# Patient Record
Sex: Female | Born: 1995 | Race: White | Hispanic: No | Marital: Single | Smoking: Never smoker
Health system: Southern US, Community
[De-identification: ages and names within clinical notes are randomized; demographics above are authoritative.]

## PROBLEM LIST (undated history)

## (undated) DIAGNOSIS — K589 Irritable bowel syndrome without diarrhea: Secondary | ICD-10-CM

## (undated) DIAGNOSIS — I82409 Acute embolism and thrombosis of unspecified deep veins of unspecified lower extremity: Secondary | ICD-10-CM

## (undated) DIAGNOSIS — F445 Conversion disorder with seizures or convulsions: Secondary | ICD-10-CM

## (undated) DIAGNOSIS — J45909 Unspecified asthma, uncomplicated: Secondary | ICD-10-CM

## (undated) DIAGNOSIS — D6861 Antiphospholipid syndrome: Secondary | ICD-10-CM

## (undated) DIAGNOSIS — F32A Depression, unspecified: Secondary | ICD-10-CM

## (undated) DIAGNOSIS — K824 Cholesterolosis of gallbladder: Secondary | ICD-10-CM

## (undated) DIAGNOSIS — F39 Unspecified mood [affective] disorder: Secondary | ICD-10-CM

## (undated) DIAGNOSIS — H548 Legal blindness, as defined in USA: Secondary | ICD-10-CM

## (undated) DIAGNOSIS — F419 Anxiety disorder, unspecified: Secondary | ICD-10-CM

## (undated) DIAGNOSIS — N390 Urinary tract infection, site not specified: Secondary | ICD-10-CM

## (undated) DIAGNOSIS — F329 Major depressive disorder, single episode, unspecified: Secondary | ICD-10-CM

## (undated) DIAGNOSIS — F3181 Bipolar II disorder: Secondary | ICD-10-CM

## (undated) DIAGNOSIS — G43909 Migraine, unspecified, not intractable, without status migrainosus: Secondary | ICD-10-CM

## (undated) DIAGNOSIS — F449 Dissociative and conversion disorder, unspecified: Secondary | ICD-10-CM

## (undated) DIAGNOSIS — M797 Fibromyalgia: Secondary | ICD-10-CM

## (undated) DIAGNOSIS — A048 Other specified bacterial intestinal infections: Secondary | ICD-10-CM

## (undated) HISTORY — DX: Other specified bacterial intestinal infections: A04.8

## (undated) HISTORY — DX: Unspecified mood (affective) disorder: F39

## (undated) HISTORY — DX: Cholesterolosis of gallbladder: K82.4

## (undated) HISTORY — DX: Unspecified asthma, uncomplicated: J45.909

## (undated) HISTORY — DX: Anxiety disorder, unspecified: F41.9

## (undated) HISTORY — DX: Depression, unspecified: F32.A

## (undated) HISTORY — DX: Bipolar II disorder: F31.81

## (undated) HISTORY — DX: Dissociative and conversion disorder, unspecified: F44.9

## (undated) HISTORY — DX: Fibromyalgia: M79.7

## (undated) HISTORY — DX: Conversion disorder with seizures or convulsions: F44.5

## (undated) HISTORY — DX: Migraine, unspecified, not intractable, without status migrainosus: G43.909

## (undated) HISTORY — DX: Irritable bowel syndrome, unspecified: K58.9

## (undated) HISTORY — DX: Urinary tract infection, site not specified: N39.0

## (undated) HISTORY — DX: Major depressive disorder, single episode, unspecified: F32.9

## (undated) HISTORY — PX: COLONOSCOPY WITH ESOPHAGOGASTRODUODENOSCOPY (EGD): SHX5779

## (undated) HISTORY — DX: Legal blindness, as defined in USA: H54.8

## (undated) HISTORY — DX: Acute embolism and thrombosis of unspecified deep veins of unspecified lower extremity: I82.409

---
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
## 2016-02-06 DIAGNOSIS — R519 Headache, unspecified: Secondary | ICD-10-CM | POA: Insufficient documentation

## 2016-06-02 DIAGNOSIS — G5622 Lesion of ulnar nerve, left upper limb: Secondary | ICD-10-CM | POA: Insufficient documentation

## 2016-08-05 DIAGNOSIS — I82409 Acute embolism and thrombosis of unspecified deep veins of unspecified lower extremity: Secondary | ICD-10-CM | POA: Insufficient documentation

## 2016-11-30 DIAGNOSIS — H938X1 Other specified disorders of right ear: Secondary | ICD-10-CM | POA: Insufficient documentation

## 2016-11-30 DIAGNOSIS — R2689 Other abnormalities of gait and mobility: Secondary | ICD-10-CM | POA: Insufficient documentation

## 2017-02-03 DIAGNOSIS — E559 Vitamin D deficiency, unspecified: Secondary | ICD-10-CM | POA: Insufficient documentation

## 2017-04-04 DIAGNOSIS — F445 Conversion disorder with seizures or convulsions: Secondary | ICD-10-CM | POA: Insufficient documentation

## 2017-04-04 DIAGNOSIS — M5481 Occipital neuralgia: Secondary | ICD-10-CM | POA: Insufficient documentation

## 2017-05-26 DIAGNOSIS — R197 Diarrhea, unspecified: Secondary | ICD-10-CM | POA: Insufficient documentation

## 2018-01-05 LAB — PROTIME-INR: INR: 1.8 — AB (ref 0.9–1.1)

## 2018-01-31 LAB — PROTIME-INR: INR: 1.6 — AB (ref 0.9–1.1)

## 2018-02-10 LAB — PROTIME-INR: INR: 1.9 — AB (ref 0.9–1.1)

## 2018-02-21 LAB — PROTIME-INR: INR: 2.8 — AB (ref 0.9–1.1)

## 2018-03-03 LAB — PROTIME-INR: INR: 2.1 — AB (ref 0.9–1.1)

## 2018-03-16 ENCOUNTER — Encounter: Payer: Self-pay | Admitting: Adult Health

## 2018-04-05 LAB — PROTIME-INR: INR: 2.2 — AB (ref 0.9–1.1)

## 2018-04-12 ENCOUNTER — Encounter: Payer: Self-pay | Admitting: Family Medicine

## 2018-04-12 DIAGNOSIS — I951 Orthostatic hypotension: Secondary | ICD-10-CM

## 2018-04-12 DIAGNOSIS — F449 Dissociative and conversion disorder, unspecified: Secondary | ICD-10-CM | POA: Insufficient documentation

## 2018-04-12 DIAGNOSIS — R000 Tachycardia, unspecified: Secondary | ICD-10-CM

## 2018-04-12 DIAGNOSIS — M545 Low back pain, unspecified: Secondary | ICD-10-CM | POA: Insufficient documentation

## 2018-04-12 DIAGNOSIS — M7918 Myalgia, other site: Secondary | ICD-10-CM | POA: Insufficient documentation

## 2018-04-12 DIAGNOSIS — G90A Postural orthostatic tachycardia syndrome (POTS): Secondary | ICD-10-CM | POA: Insufficient documentation

## 2018-04-12 DIAGNOSIS — Z86718 Personal history of other venous thrombosis and embolism: Secondary | ICD-10-CM | POA: Insufficient documentation

## 2018-04-12 DIAGNOSIS — H548 Legal blindness, as defined in USA: Secondary | ICD-10-CM | POA: Insufficient documentation

## 2018-04-12 DIAGNOSIS — M4180 Other forms of scoliosis, site unspecified: Secondary | ICD-10-CM | POA: Insufficient documentation

## 2018-04-12 DIAGNOSIS — G8929 Other chronic pain: Secondary | ICD-10-CM | POA: Insufficient documentation

## 2018-04-12 DIAGNOSIS — I498 Other specified cardiac arrhythmias: Secondary | ICD-10-CM | POA: Insufficient documentation

## 2018-04-12 DIAGNOSIS — K589 Irritable bowel syndrome without diarrhea: Secondary | ICD-10-CM | POA: Insufficient documentation

## 2018-04-13 NOTE — Progress Notes (Signed)
Subjective: DS:KAJGOTLXB care, multiple issues HPI: Samantha Dougherty is a 22 y.o. female presenting to clinic today for:  1. History of migraine headaches/ nonepileptic seizures Patient with a several history of migraine headaches.  She was actually evaluated at [facility] in [place] on March 12, 2017 for seizure-like activity.  Which was described as body jerking activity and a degree of unresponsiveness.  Some eye wandering and speech impairment also noted.  She was admitted to the epilepsy monitoring unit and had 2 days of continuous EEG monitoring which did not demonstrate any epileptiform activity despite having had 5 clinical events during stay.  The seizures were determined to be nonepileptic in origin and she was discharged.  During that hospital course she was having severe occipital headaches and received a occipital nerve block.  She was also discharged with a trial of tramadol and instructed to use gabapentin for neuropathic type pain as a prophylactic medication.  2. Lupus anticoagulant disorder/ hx LLE DVT History of left lower extremity DVT.  She notes that she was found to have lupus anticoagulant disorder and was transitioned from Xarelto to Coumadin.  Goal INR 2-3.  She reports having had INR performed 4 days ago and it was 2.2.  Patient reports that she was seeing a hematologist in [place].  I have not yet received records from them with the exception of lab work obtained at the hospital on 08/20/2016.Marland Kitchen  Lab work at the hospital notes that she was markedly elevated for dilute prothrombin time which was 70.1 with reference range of 0 to 55 seconds.  Thrombin time 18.2 with reference range of 0 to 23 seconds.  PTT-LA 53.1 with reference range of 0 to 51.9 seconds.  Dilute Russell viper venom time 78.2 with reference range of 0 to 47.0 seconds.  Interpretation was that results were consistent with lupus anticoagulant.  3. 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  She has had a EGD and colonoscopy in the past.  She was actually found to have H. pylori and treated for this.  On most recent imaging study, there was a 5 mm polyp noted on the gallbladder.  This was a right upper quadrant ultrasound from March 16, 2018.  She is currently treated with Levsin 0.125 mg every 6 hours as needed.  4. Conversion disorder/ Mood disorder Per chart review, history of conversion disorder, bipolar 2 disorder/mood disorder.  She notes that she is treated with Lamictal, Rexulti, Restoril and Xanax.  Non-epileptiform seizure disorder as above.  Mother also reports that she will sometimes have juvenile speech and patient notes that she also sometimes feels like she is detached.  No substance use.  Family history is significant for anxiety and depression.   Past Medical History:  Diagnosis Date  . Bipolar 2 disorder ([place])   . Conversion disorder   . DVT (deep venous thrombosis) (HCC)    LLE  . IBS (irritable bowel syndrome)   . Legal blindness    right eye  . Migraine   . Mood disorder ([place])   . POTS (postural orthostatic tachycardia syndrome)   . Psychogenic nonepileptic seizure    Past Surgical History:  Procedure Laterality Date  . COLONOSCOPY WITH ESOPHAGOGASTRODUODENOSCOPY (EGD)    . HYMENECTOMY     Social History   Socioeconomic History  . Marital status: Single    Spouse name: Not on file  . Number of children: Not on file  .  Years of education: Not on file  . Highest education level: Not on file  Occupational History  . Not on file  Social Needs  . Financial resource strain: Not on file  . Food insecurity:    Worry: Not on file    Inability: Not on file  . Transportation needs:    Medical: Not on file    Non-medical: Not on file  Tobacco Use  . Smoking status: Never Smoker  . Smokeless tobacco: Never Used  Substance and Sexual Activity  . Alcohol use: Never    Frequency: Never  . Drug use: Never    . Sexual activity: Not Currently  Lifestyle  . Physical activity:    Days per week: Not on file    Minutes per session: Not on file  . Stress: Not on file  Relationships  . Social connections:    Talks on phone: Not on file    Gets together: Not on file    Attends religious service: Not on file    Active member of club or organization: Not on file    Attends meetings of clubs or organizations: Not on file    Relationship status: Not on file  . Intimate partner violence:    Fear of current or ex partner: Not on file    Emotionally abused: Not on file    Physically abused: Not on file    Forced sexual activity: Not on file  Other Topics Concern  . Not on file  Social History Narrative   From [place].  Was receiving medical care at [facility].  Resides with mother.   Current Meds  Medication Sig  . ALPRAZolam (XANAX) 0.5 MG tablet Take 0.5 mg by mouth 3 (three) times daily as needed for anxiety.  . Brexpiprazole (REXULTI) 0.5 MG TABS Take 0.5 mg by mouth every evening.  . escitalopram (LEXAPRO) 20 MG tablet Take 20 mg by mouth daily.  . hyoscyamine (LEVSIN) 0.125 MG/5ML ELIX Take 0.125 mg by mouth every 6 (six) hours as needed.  . lamoTRIgine (LAMICTAL) 25 MG tablet Take 50 mg by mouth daily.  . temazepam (RESTORIL) 7.5 MG capsule Take 7.5 mg by mouth at bedtime as needed for sleep.  . traMADol (ULTRAM) 50 MG tablet Take by mouth daily.  Marland Kitchen warfarin (COUMADIN) 5 MG tablet Take 5 mg by mouth daily. Take on tues, thur, sat and sun  . warfarin (COUMADIN) 7.5 MG tablet Take 7.5 mg by mouth daily. Take on mon, wed, fri   Family History  Problem Relation Age of Onset  . Anxiety disorder Mother   . Depression Mother   . Hyperlipidemia Mother   . Thyroid disease Maternal Grandmother   . Thyroid disease Maternal Grandfather    Allergies  Allergen Reactions  . Amitriptyline     Hallucinations  . Sulfa Antibiotics      Health Maintenance: ?Tdap  ROS: Per HPI  Objective: Office  vital signs reviewed. BP 98/68   Pulse 81   Temp (!) 97.5 F (36.4 C) (Oral)   Ht '5\' 3"'$  (1.6 m)   Wt 153 lb (69.4 kg)   BMI 27.10 kg/m   Physical Examination:  General: Awake, alert, well appearing female, No acute distress HEENT: Normal, MMM Cardio: regular rate and rhythm, S1S2 heard, no murmurs appreciated Pulm: clear to auscultation bilaterally, no wheezes, rhonchi or rales; normal work of breathing on room air Extremities: warm, well perfused, No edema, cyanosis or clubbing; +2 pulses bilaterally; has brace on Left  knee MSK: normal gait and normal station Skin: dry; intact Neuro: No resting tremor noted.  No nystagmus noted.  She is oriented.  She follows commands.  Moves all extremities independently.   Psych: Patient is pleasant.  Mood stable.  Affect appropriate.  Eye contact fair.  Does not appear to be responding to internal stimuli. Depression screen PHQ 2/9 04/14/2018  Decreased Interest 1  Down, Depressed, Hopeless 0  PHQ - 2 Score 1   No flowsheet data found.  Assessment/ Plan: 22 y.o. female   1. Bipolar affective disorder, remission status unspecified ([place]) I have yet to receive notes from her primary care doctor.  I did receive quite a bit of hospital results and notes from hospitalizations over the last 2 years at [facility].  These will be scanned into the EMR.  She is on quite a bit of psychiatric medication including to benzodiazepines.  Will obtain metabolic labs to monitor for metabolic complications of these medications.  I have referred her to psychiatry for further evaluation and management.  She has refills on medications currently.  We discussed that if benzodiazepines are determined to be appropriate that these would need to be prescribed by psychiatry.  She voiced good understanding and is looking forward to establishing with a new psychiatrist. - Ambulatory referral to Psychiatry - Lipid Panel - CMP14+EGFR - CBC with Differential - Bayer DCA Hb A1c  Waived  2. Encounter to establish care with new doctor Release of information form completed for psychiatry, PCP, gastroenterology, hematology, rheumatology  3. Conversion disorder As above - Ambulatory referral to Psychiatry  4. Lupus anticoagulant disorder [facility]) We will scan results that were received from [facility] into the EMR.  She is currently on Coumadin and at a therapeutic dose.  She will follow-up in 1 week with Sharyn Lull, our pharmacist, for Coumadin check.  May be a candidate for at home Coumadin monitoring.  Sharyn Lull will discuss this further and check with patient's insurance to see if this may be something she is interested in. - Ambulatory referral to Hematology  5. Irritable bowel syndrome, unspecified type Will place referral to gastroenterology and attempt to obtain records from previous gastroenterologist.  I did receive a CT abdomen pelvis which demonstrated mild fatty liver but no other acute abnormalities.  This was obtained on 08/24/2017.  Her right upper quadrant ultrasound from March 16, 2018 showed a 5 mm polyp within the gallbladder but was otherwise within normal limits. - Ambulatory referral to Gastroenterology  6. Migraine without status migrainosus, not intractable, unspecified migraine type Will place referral to neurology.  We discussed that use of tramadol may not be an appropriate medication for migraine headaches.  However I will defer to the expertise of the neurologists given patient's complex medical history.  Will be happy to fax over recent admission in 2018 to the neurologic service for non-epileptiform seizure like activity, where EEG and brain imaging studies were performed.  We will also place this in the media tab in the EMR. - Ambulatory referral to Neurology  7. Seizure-like activity ([place]) - Ambulatory referral to Neurology  8. Medication monitoring encounter - Lipid Panel - CMP14+EGFR - CBC with Differential - Bayer DCA Hb A1c  Waived  Total time spent with patient 46 minutes.  Greater than 50% of encounter spent in coordination of care/counseling.  Janora Norlander, DO [place] [phone]

## 2018-04-14 ENCOUNTER — Encounter: Payer: Self-pay | Admitting: Family Medicine

## 2018-04-14 ENCOUNTER — Ambulatory Visit: Payer: 59 | Admitting: Family Medicine

## 2018-04-14 VITALS — BP 98/68 | HR 81 | Temp 97.5°F | Ht 63.0 in | Wt 153.0 lb

## 2018-04-14 DIAGNOSIS — D6862 Lupus anticoagulant syndrome: Secondary | ICD-10-CM | POA: Diagnosis not present

## 2018-04-14 DIAGNOSIS — F319 Bipolar disorder, unspecified: Secondary | ICD-10-CM

## 2018-04-14 DIAGNOSIS — Z7689 Persons encountering health services in other specified circumstances: Secondary | ICD-10-CM | POA: Diagnosis not present

## 2018-04-14 DIAGNOSIS — K589 Irritable bowel syndrome without diarrhea: Secondary | ICD-10-CM

## 2018-04-14 DIAGNOSIS — Z5181 Encounter for therapeutic drug level monitoring: Secondary | ICD-10-CM

## 2018-04-14 DIAGNOSIS — R569 Unspecified convulsions: Secondary | ICD-10-CM | POA: Insufficient documentation

## 2018-04-14 DIAGNOSIS — G43909 Migraine, unspecified, not intractable, without status migrainosus: Secondary | ICD-10-CM | POA: Insufficient documentation

## 2018-04-14 DIAGNOSIS — F449 Dissociative and conversion disorder, unspecified: Secondary | ICD-10-CM | POA: Diagnosis not present

## 2018-04-14 LAB — BAYER DCA HB A1C WAIVED: HB A1C: 4.8 % (ref <7.0)

## 2018-04-14 NOTE — Patient Instructions (Addendum)
Please make sure that you arrive at least 15 minutes before your scheduled appointment in the future.  You have complex medical needs and I will be unable to accommodate late arrivals going forward.  I have placed referrals today.  You will hear for an appointment within the next week.  We will work on getting records from each of your specialists.  You had labs performed today.  You will be contacted with the results of the labs once they are available, usually in the next 3 business days for routine lab work.

## 2018-04-15 LAB — CBC WITH DIFFERENTIAL/PLATELET
BASOS ABS: 0 10*3/uL (ref 0.0–0.2)
Basos: 0 %
EOS (ABSOLUTE): 0.1 10*3/uL (ref 0.0–0.4)
Eos: 2 %
HEMOGLOBIN: 14.5 g/dL (ref 11.1–15.9)
Hematocrit: 42.7 % (ref 34.0–46.6)
IMMATURE GRANS (ABS): 0 10*3/uL (ref 0.0–0.1)
IMMATURE GRANULOCYTES: 0 %
LYMPHS: 30 %
Lymphocytes Absolute: 1.8 10*3/uL (ref 0.7–3.1)
MCH: 31.1 pg (ref 26.6–33.0)
MCHC: 34 g/dL (ref 31.5–35.7)
MCV: 92 fL (ref 79–97)
MONOCYTES: 8 %
Monocytes Absolute: 0.5 10*3/uL (ref 0.1–0.9)
NEUTROS PCT: 60 %
Neutrophils Absolute: 3.6 10*3/uL (ref 1.4–7.0)
Platelets: 242 10*3/uL (ref 150–450)
RBC: 4.66 x10E6/uL (ref 3.77–5.28)
RDW: 13.2 % (ref 12.3–15.4)
WBC: 5.9 10*3/uL (ref 3.4–10.8)

## 2018-04-15 LAB — CMP14+EGFR
ALK PHOS: 85 IU/L (ref 39–117)
ALT: 35 IU/L — AB (ref 0–32)
AST: 42 IU/L — ABNORMAL HIGH (ref 0–40)
Albumin/Globulin Ratio: 1.7 (ref 1.2–2.2)
Albumin: 4.6 g/dL (ref 3.5–5.5)
BILIRUBIN TOTAL: 0.4 mg/dL (ref 0.0–1.2)
BUN/Creatinine Ratio: 14 (ref 9–23)
BUN: 8 mg/dL (ref 6–20)
CHLORIDE: 102 mmol/L (ref 96–106)
CO2: 24 mmol/L (ref 20–29)
Calcium: 9.3 mg/dL (ref 8.7–10.2)
Creatinine, Ser: 0.59 mg/dL (ref 0.57–1.00)
GFR calc Af Amer: 153 mL/min/{1.73_m2} (ref >59)
GFR calc non Af Amer: 132 mL/min/{1.73_m2} (ref >59)
GLUCOSE: 81 mg/dL (ref 65–99)
Globulin, Total: 2.7 g/dL (ref 1.5–4.5)
Potassium: 4.2 mmol/L (ref 3.5–5.2)
Sodium: 140 mmol/L (ref 134–144)
Total Protein: 7.3 g/dL (ref 6.0–8.5)

## 2018-04-15 LAB — LIPID PANEL
Chol/HDL Ratio: 3.4 ratio (ref 0.0–4.4)
Cholesterol, Total: 171 mg/dL (ref 100–199)
HDL: 50 mg/dL (ref >39)
LDL CALC: 93 mg/dL (ref 0–99)
Triglycerides: 140 mg/dL (ref 0–149)
VLDL CHOLESTEROL CAL: 28 mg/dL (ref 5–40)

## 2018-04-20 ENCOUNTER — Inpatient Hospital Stay (HOSPITAL_COMMUNITY): Payer: 59

## 2018-04-20 ENCOUNTER — Other Ambulatory Visit: Payer: Self-pay

## 2018-04-20 ENCOUNTER — Encounter (HOSPITAL_COMMUNITY): Payer: Self-pay | Admitting: Internal Medicine

## 2018-04-20 ENCOUNTER — Inpatient Hospital Stay (HOSPITAL_COMMUNITY): Payer: 59 | Attending: Internal Medicine | Admitting: Internal Medicine

## 2018-04-20 ENCOUNTER — Telehealth (HOSPITAL_COMMUNITY): Payer: Self-pay

## 2018-04-20 VITALS — BP 103/62 | HR 82 | Temp 98.6°F | Resp 16 | Wt 151.0 lb

## 2018-04-20 DIAGNOSIS — D6862 Lupus anticoagulant syndrome: Secondary | ICD-10-CM

## 2018-04-20 DIAGNOSIS — R109 Unspecified abdominal pain: Secondary | ICD-10-CM | POA: Diagnosis not present

## 2018-04-20 DIAGNOSIS — F449 Dissociative and conversion disorder, unspecified: Secondary | ICD-10-CM | POA: Insufficient documentation

## 2018-04-20 DIAGNOSIS — R0602 Shortness of breath: Secondary | ICD-10-CM | POA: Diagnosis not present

## 2018-04-20 DIAGNOSIS — G43909 Migraine, unspecified, not intractable, without status migrainosus: Secondary | ICD-10-CM | POA: Diagnosis not present

## 2018-04-20 DIAGNOSIS — Z853 Personal history of malignant neoplasm of breast: Secondary | ICD-10-CM | POA: Diagnosis not present

## 2018-04-20 DIAGNOSIS — D508 Other iron deficiency anemias: Secondary | ICD-10-CM

## 2018-04-20 DIAGNOSIS — R7989 Other specified abnormal findings of blood chemistry: Secondary | ICD-10-CM | POA: Insufficient documentation

## 2018-04-20 DIAGNOSIS — K589 Irritable bowel syndrome without diarrhea: Secondary | ICD-10-CM | POA: Diagnosis not present

## 2018-04-20 DIAGNOSIS — Z803 Family history of malignant neoplasm of breast: Secondary | ICD-10-CM | POA: Insufficient documentation

## 2018-04-20 DIAGNOSIS — R760 Raised antibody titer: Secondary | ICD-10-CM | POA: Diagnosis not present

## 2018-04-20 DIAGNOSIS — Z832 Family history of diseases of the blood and blood-forming organs and certain disorders involving the immune mechanism: Secondary | ICD-10-CM | POA: Insufficient documentation

## 2018-04-20 DIAGNOSIS — Z86718 Personal history of other venous thrombosis and embolism: Secondary | ICD-10-CM | POA: Diagnosis not present

## 2018-04-20 DIAGNOSIS — G40909 Epilepsy, unspecified, not intractable, without status epilepticus: Secondary | ICD-10-CM

## 2018-04-20 LAB — PROTIME-INR
INR: 2.28
Prothrombin Time: 25 seconds — ABNORMAL HIGH (ref 11.4–15.2)

## 2018-04-20 NOTE — Telephone Encounter (Signed)
Tried to call patient multiple time this afternoon to let her know her INR was good and to continue to take her same dose that she is on. Will try to call back tomorrow.

## 2018-04-20 NOTE — Progress Notes (Signed)
Referring physician: [facility].  Diagnosis Lupus anticoagulant disorder (HCC) - Plan: Protime-INR  Irritable bowel syndrome, unspecified type  Other iron deficiency anemia - Plan: Ferritin  Staging Cancer Staging No matching staging information was found for the patient.  Assessment and Plan: 1.  Reported history of lupus anticoagulant with left lower extremity DVT.  This reportedly was diagnosed in 2017 or 18.  Records are unavailable for review.  I discussed with her we will obtain records from Dr. Rosine BeatBellman's office in [place] to determine her prior work-up.  Pending those results she may undergo additional studies.  It would be reasonable that she undergo repeat imaging with ultrasound pending her last evaluation once records have been reviewed.  INR was done today and was 2.28.  She should continue her current Coumadin dose until we have been able to review records.  She will return to clinic in 2 to 3 weeks for follow-up.  Further recommendations will follow once records have been reviewed.  She is also referred to GYN for evaluation and discussion of contraceptive options as she reports she is currently on OCP RX by [place] physician.    2.  Seizure disorder/migraines.  She should follow-up with PCP or neurology for evaluation.  3.  IBS.  Patient reportedly has undergone EGD and colonoscopy in the past.  She should follow-up with PCP and GI as recommended.  Will check ferritin today to evaluate for any evidence of iron deficiency.  4.  Conversion disorder/mood disorder.  She should follow-up with PCP or psychiatry as recommended.  5.  Family history of breast cancer and factor V Leiden deficiency.  Will review records to determine if patient underwent hypercoagulable evaluation.  HPI: 22 year old female who has moved here from [place].  She reportedly was diagnosed with a left lower extremity DVT in 2017 or 2018.  She was followed by Dr. Oretha EllisBelman of  hematology in [place].  No records are available for review but [facility] shows labs done 08/20/2016 showed a PT of 70 with a lupus anticoagulant of 53 and a dilute Russell viper venom time of 78.2.  Interpretation of results was reported as consistent with lupus anticoagulant.  According to the patient testing was not done while she was on anticoagulation.  She was reportedly transitioned from Xarelto to Coumadin.  She is also on progesterone only birth control pills and reports she was followed by OB GYN in [place].  She has been on with factor V Leiden deficiency and that on also was diagnosed with breast cancer at the age of 22.  She had recent blood work done 04/14/2018 that showed a white count of 5.9 hemoglobin 14.5 platelets 242,000 chemistries were within normal limits with a creatinine of 0.59 liver function tests were normal at 42 AST ALT was 35.  Patient is seen today for consultation due to reported history of lupus anticoagulant with a history of a left lower extremity DVT.  Problem List Patient Active Problem List   Diagnosis Date Noted  . Bipolar disorder (HCC) [F31.9] 04/14/2018  . 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 Seizure-like activity (HCC) [R56.9] 04/14/2018  . Migraine without status migrainosus, not intractable [G43.909] 04/14/2018  . Chronic low back pain [M54.5, G89.29] 04/12/2018  . History of DVT of lower extremity [Z86.718] 04/12/2018  . Legal blindness [H54.8] 04/12/2018  . POTS (postural orthostatic tachycardia syndrome) [R00.0, I95.1] 04/12/2018  . Conversion disorder [F44.9] 04/12/2018  . IBS (irritable bowel syndrome) [K58.9] 04/12/2018  .  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 Dextroscoliosis [M41.80] 04/12/2018    Past Medical History Past Medical History:  Diagnosis Date  . Bipolar 2 disorder (HCC)   . Conversion disorder   . DVT (deep venous thrombosis) (HCC)    LLE  . IBS  (irritable bowel syndrome)   . Legal blindness    right eye  . Migraine   . Mood disorder (HCC)   . POTS (postural orthostatic tachycardia syndrome)   . Psychogenic nonepileptic seizure     Past Surgical History Past Surgical History:  Procedure Laterality Date  . COLONOSCOPY WITH ESOPHAGOGASTRODUODENOSCOPY (EGD)    . HYMENECTOMY      Family History Family History  Problem Relation Age of Onset  . Anxiety disorder Mother   . Depression Mother   . Hyperlipidemia Mother   . Thyroid disease Maternal Grandmother   . Deep vein thrombosis Maternal Grandmother   . Thyroid disease Maternal Grandfather   . Anxiety disorder Father   . Depression Father   . Autism Sister   . Other Sister        [place] syndrome  . Factor V Leiden deficiency Maternal Aunt   . Breast cancer Maternal Aunt   . Diabetes Paternal Grandfather   . Deep vein thrombosis Maternal Aunt      Social History  reports that she has never smoked. She has never used smokeless tobacco. She reports that she does not drink alcohol or use drugs.  Medications  Current Outpatient Medications:  .  ALPRAZolam (XANAX) 0.5 MG tablet, Take 0.5 mg by mouth 3 (three) times daily as needed for anxiety., Disp: , Rfl:  .  Brexpiprazole (REXULTI) 0.5 MG TABS, Take 0.5 mg by mouth every evening., Disp: , Rfl:  .  escitalopram (LEXAPRO) 20 MG tablet, Take 20 mg by mouth daily., Disp: , Rfl:  .  hyoscyamine (LEVSIN) 0.125 MG/5ML ELIX, Take 0.125 mg by mouth every 6 (six) hours as needed., Disp: , Rfl:  .  lamoTRIgine (LAMICTAL) 25 MG tablet, Take 50 mg by mouth daily., Disp: , Rfl:  .  temazepam (RESTORIL) 7.5 MG capsule, Take 7.5 mg by mouth at bedtime as needed for sleep., Disp: , Rfl:  .  traMADol (ULTRAM) 50 MG tablet, Take by mouth daily., Disp: , Rfl:  .  warfarin (COUMADIN) 5 MG tablet, Take 5 mg by mouth daily. Take on tues, thur, sat and sun, Disp: , Rfl:  .  warfarin (COUMADIN) 7.5 MG tablet, Take 7.5 mg by mouth daily.  Take on mon, wed, fri, Disp: , Rfl:   Allergies Amitriptyline and Sulfa antibiotics  Review of Systems Review of Systems - Oncology ROS negative   Physical Exam  Vitals Wt Readings from Last 3 Encounters:  04/20/18 151 lb (68.5 kg)  04/14/18 153 lb (69.4 kg)   Temp Readings from Last 3 Encounters:  04/20/18 98.6 F (37 C) (Oral)  04/14/18 (!) 97.5 F (36.4 C) (Oral)   BP Readings from Last 3 Encounters:  04/20/18 103/62  04/14/18 98/68   Pulse Readings from Last 3 Encounters:  04/20/18 82  04/14/18 81   Constitutional: Well-developed, well-nourished, and in no distress.   HENT: Head: Normocephalic and atraumatic.  Mouth/Throat: No oropharyngeal exudate. Mucosa moist. Eyes: Pupils are equal, round, and reactive to light. Conjunctivae are normal. No scleral icterus.  Neck: Normal range of motion. Neck supple. No JVD present.  Cardiovascular: Normal rate, regular rhythm and normal heart sounds.  Exam reveals no gallop and no  friction rub.   No murmur heard. Pulmonary/Chest: Effort normal and breath sounds normal. No respiratory distress. No wheezes.No rales.  Abdominal: Soft. Bowel sounds are normal. No distension. There is no tenderness. There is no guarding.  Musculoskeletal: No edema or tenderness.  Lymphadenopathy: No cervical, axillary or supraclavicular adenopathy.  Neurological: Alert and oriented to person, place, and time. No cranial nerve deficit.  Skin: Skin is warm and dry. No rash noted. No erythema. No pallor.  Psychiatric: Affect and judgment normal.   Labs Appointment on 04/20/2018  Component Date Value Ref Range Status  . Prothrombin Time 04/20/2018 25.0* 11.4 - 15.2 seconds Final  . INR 04/20/2018 2.28   Final   Performed at [facility], [address] [place], [place] [address]     Pathology Orders Placed This Encounter  Procedures  . Protime-INR    Standing Status:   Future    Number of Occurrences:   1    Standing Expiration Date:    04/21/2019  . Ferritin    Standing Status:   Future    Standing Expiration Date:   04/21/2019       Ahmed Prima MD

## 2018-04-21 ENCOUNTER — Encounter: Payer: 59 | Admitting: Pharmacist Clinician (PhC)/ Clinical Pharmacy Specialist

## 2018-04-24 ENCOUNTER — Encounter: Payer: Self-pay | Admitting: *Deleted

## 2018-05-01 ENCOUNTER — Emergency Department (HOSPITAL_COMMUNITY)
Admission: EM | Admit: 2018-05-01 | Discharge: 2018-05-01 | Disposition: A | Discharge status: Home / Self Care | Payer: 59 | Attending: Emergency Medicine | Admitting: Emergency Medicine

## 2018-05-01 ENCOUNTER — Encounter (HOSPITAL_COMMUNITY): Payer: Self-pay | Admitting: Emergency Medicine

## 2018-05-01 ENCOUNTER — Other Ambulatory Visit: Payer: Self-pay

## 2018-05-01 ENCOUNTER — Emergency Department (HOSPITAL_COMMUNITY): Payer: 59

## 2018-05-01 DIAGNOSIS — R2242 Localized swelling, mass and lump, left lower limb: Secondary | ICD-10-CM | POA: Insufficient documentation

## 2018-05-01 DIAGNOSIS — Z7901 Long term (current) use of anticoagulants: Secondary | ICD-10-CM | POA: Insufficient documentation

## 2018-05-01 DIAGNOSIS — Z79899 Other long term (current) drug therapy: Secondary | ICD-10-CM | POA: Diagnosis not present

## 2018-05-01 DIAGNOSIS — M25562 Pain in left knee: Secondary | ICD-10-CM | POA: Diagnosis present

## 2018-05-01 DIAGNOSIS — M79605 Pain in left leg: Secondary | ICD-10-CM

## 2018-05-01 LAB — PROTIME-INR
INR: 2.74
Prothrombin Time: 28.8 seconds — ABNORMAL HIGH (ref 11.4–15.2)

## 2018-05-01 NOTE — Discharge Instructions (Addendum)
Take Tylenol for pain and follow-up with your doctor if any problems °

## 2018-05-01 NOTE — ED Triage Notes (Signed)
Patient complaining of swelling to left leg after a dog scratch 2 days ago. 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

## 2018-05-01 NOTE — ED Notes (Signed)
Returned from xray

## 2018-05-01 NOTE — ED Provider Notes (Signed)
[facility] Provider Note   CSN: [id number] Arrival date & time: 05/01/18  1226     History   Chief Complaint Chief Complaint  Patient presents with  . Leg Swelling    HPI Samantha Dougherty is a 22 y.o. female.  HPI Patient had a dog scratch her lower leg 2 days ago.  Has some swelling and pain.  States the pain is now moved behind her left knee.  States she is worried because she has had previous blood clots and is hypercoagulable.  She is on chronic Coumadin.  No chest pain or trouble breathing.  No fevers or chills. Past Medical History:  Diagnosis Date  . Bipolar 2 disorder (HCC)   . Conversion disorder   . DVT (deep venous thrombosis) (HCC)    LLE  . IBS (irritable bowel syndrome)   . Legal blindness    right eye  . Migraine   . Mood disorder (HCC)   . POTS (postural orthostatic tachycardia syndrome)   . Psychogenic nonepileptic seizure     Patient Active Problem List   Diagnosis Date Noted  . Bipolar disorder (HCC) 04/14/2018  . 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  . Seizure-like activity (HCC) 04/14/2018  . Migraine without status migrainosus, not intractable 04/14/2018  . Chronic low back pain 04/12/2018  . History of DVT of lower extremity 04/12/2018  . 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 POTS (postural orthostatic tachycardia syndrome) 04/12/2018  . Conversion disorder 04/12/2018  . IBS (irritable bowel syndrome) 04/12/2018  . 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 Dextroscoliosis 04/12/2018    Past Surgical History:  Procedure Laterality Date  . COLONOSCOPY WITH ESOPHAGOGASTRODUODENOSCOPY (EGD)    . HYMENECTOMY       OB History   None      Home Medications    Prior to Admission medications   Medication Sig Start Date End Date Taking? Authorizing Provider  ALPRAZolam Prudy Feeler(XANAX) 0.5 MG tablet Take 0.5 mg by mouth 3 (three) times daily as needed for anxiety.   Yes [provider]  Brexpiprazole  (REXULTI) 0.5 MG TABS Take 0.5 mg by mouth every evening.   Yes [provider]  escitalopram (LEXAPRO) 20 MG tablet Take 20 mg by mouth daily.   Yes [provider]  lamoTRIgine (LAMICTAL) 25 MG tablet Take 50 mg by mouth daily.   Yes [provider]  temazepam (RESTORIL) 7.5 MG capsule Take 7.5 mg by mouth at bedtime as needed for sleep.   Yes [provider]  traMADol (ULTRAM) 50 MG tablet Take by mouth daily.   Yes [provider]  warfarin (COUMADIN) 5 MG tablet Take 5 mg by mouth daily. Take on tues, thur, sat and sun   Yes [provider]  hyoscyamine (LEVSIN) 0.125 MG/5ML ELIX Take 0.125 mg by mouth every 6 (six) hours as needed.    [provider]  warfarin (COUMADIN) 7.5 MG tablet Take 7.5 mg by mouth daily. Take on mon, wed, fri    [provider]    Family History Family History  Problem Relation Age of Onset  . Anxiety disorder Mother   . Depression Mother   . Hyperlipidemia Mother   . Thyroid disease Maternal Grandmother   . Deep vein thrombosis Maternal Grandmother   . Thyroid disease Maternal Grandfather   . Anxiety disorder Father   . Depression Father   . Autism Sister   . Other Sister        [place] syndrome  .  Factor V Leiden deficiency Maternal Aunt   . Breast cancer Maternal Aunt   . Diabetes Paternal Grandfather   . Deep vein thrombosis Maternal Aunt     Social History Social History   Tobacco Use  . Smoking status: Never Smoker  . Smokeless tobacco: Never Used  Substance Use Topics  . Alcohol use: Never    Frequency: Never  . Drug use: Never     Allergies   Amitriptyline and Sulfa antibiotics   Review of Systems Review of Systems  Constitutional: Negative for appetite change.  Respiratory: Negative for cough and shortness of breath.   Cardiovascular: Positive for leg swelling.  Gastrointestinal: Negative for abdominal distention.  Genitourinary: Negative for flank pain.    Musculoskeletal: Negative for back pain.       Pain behind left knee.  Neurological: Negative for weakness.  Hematological: Bruises/bleeds easily.  Psychiatric/Behavioral: Negative for confusion.     Physical Exam Updated Vital Signs BP 108/71 (BP Location: Right Arm)   Pulse 75   Temp 97.9 F (36.6 C) (Oral)   Resp 14   Ht 5\' 3"  (1.6 m)   Wt 66.2 kg   LMP 04/24/2018   SpO2 98%   BMI 25.86 kg/m   Physical Exam  Constitutional: She appears well-developed.  HENT:  Head: Atraumatic.  Eyes: EOM are normal.  Neck: Neck supple.     ED Treatments / Results  Labs (all labs ordered are listed, but only abnormal results are displayed) Labs Reviewed  PROTIME-INR - Abnormal; Notable for the following components:      Result Value   Prothrombin Time 28.8 (*)    All other components within normal limits    EKG None  Radiology No results found.  Procedures Procedures (including critical care time)  Medications Ordered in ED Medications - No data to display   Initial Impression / Assessment and Plan / ED Course  I have reviewed the triage vital signs and the nursing notes.  Pertinent labs & imaging results that were available during my care of the patient were reviewed by me and considered in my medical decision making (see chart for details).     Patient with pain behind left knee.  Had dog scratch couple days ago but leg otherwise appears well.  With hypercoagulability will check Doppler.  If negative Doppler likely able to discharge home.  Final Clinical Impressions(s) / ED Diagnoses   Final diagnoses:  Acute pain of left knee    ED Discharge Orders    None       Benjiman CorePickering, Torrian Canion, MD 05/01/18 1511

## 2018-05-04 ENCOUNTER — Telehealth: Payer: Self-pay | Admitting: *Deleted

## 2018-05-04 ENCOUNTER — Ambulatory Visit: Payer: 59 | Admitting: Neurology

## 2018-05-04 NOTE — Telephone Encounter (Signed)
Patient's father called and canceled new patient appt the morning it was scheduled.  He said the patient had an active migraine and did not feel she could travel.  I called back and actually spoke to the patient.  I told her we are able to help her with her active migraine, if she would still like to come in today.  She declined the offer and said she did not feel she could make the 45 minute ride to our office.  She will call back when she is ready to reschedule.

## 2018-05-05 ENCOUNTER — Ambulatory Visit (HOSPITAL_COMMUNITY): Payer: 59 | Admitting: Internal Medicine

## 2018-05-12 ENCOUNTER — Encounter: Payer: Self-pay | Admitting: Nurse Practitioner

## 2018-05-15 ENCOUNTER — Other Ambulatory Visit: Payer: Self-pay

## 2018-05-15 ENCOUNTER — Emergency Department (HOSPITAL_COMMUNITY)
Admission: EM | Admit: 2018-05-15 | Discharge: 2018-05-16 | Disposition: A | Discharge status: Home / Self Care | Payer: 59 | Attending: Emergency Medicine | Admitting: Emergency Medicine

## 2018-05-15 ENCOUNTER — Emergency Department (HOSPITAL_COMMUNITY): Payer: 59

## 2018-05-15 ENCOUNTER — Encounter (HOSPITAL_COMMUNITY): Payer: Self-pay

## 2018-05-15 DIAGNOSIS — R110 Nausea: Secondary | ICD-10-CM | POA: Diagnosis not present

## 2018-05-15 DIAGNOSIS — R1010 Upper abdominal pain, unspecified: Secondary | ICD-10-CM | POA: Diagnosis present

## 2018-05-15 DIAGNOSIS — Z79899 Other long term (current) drug therapy: Secondary | ICD-10-CM | POA: Insufficient documentation

## 2018-05-15 DIAGNOSIS — R1013 Epigastric pain: Secondary | ICD-10-CM

## 2018-05-15 LAB — CBC
HCT: 42.1 % (ref 36.0–46.0)
Hemoglobin: 14.3 g/dL (ref 12.0–15.0)
MCH: 31 pg (ref 26.0–34.0)
MCHC: 34 g/dL (ref 30.0–36.0)
MCV: 91.3 fL (ref 78.0–100.0)
Platelets: 192 10*3/uL (ref 150–400)
RBC: 4.61 MIL/uL (ref 3.87–5.11)
RDW: 12.2 % (ref 11.5–15.5)
WBC: 7.1 10*3/uL (ref 4.0–10.5)

## 2018-05-15 LAB — BASIC METABOLIC PANEL
Anion gap: 8 (ref 5–15)
BUN: 10 mg/dL (ref 6–20)
CO2: 28 mmol/L (ref 22–32)
Calcium: 9.2 mg/dL (ref 8.9–10.3)
Chloride: 103 mmol/L (ref 98–111)
Creatinine, Ser: 0.6 mg/dL (ref 0.44–1.00)
GFR calc Af Amer: >60 mL/min (ref >60)
GFR calc non Af Amer: >60 mL/min (ref >60)
Glucose, Bld: 92 mg/dL (ref 70–99)
Potassium: 3.5 mmol/L (ref 3.5–5.1)
Sodium: 139 mmol/L (ref 135–145)

## 2018-05-15 LAB — PROTIME-INR
INR: 2.03
Prothrombin Time: 22.8 seconds — ABNORMAL HIGH (ref 11.4–15.2)

## 2018-05-15 LAB — HCG, QUANTITATIVE, PREGNANCY: hCG, Beta Chain, Quant, S: <1 m[IU]/mL (ref <5)

## 2018-05-15 LAB — TROPONIN I: Troponin I: <0.03 ng/mL (ref <0.03)

## 2018-05-15 MED ORDER — ONDANSETRON HCL 4 MG PO TABS: 4.0000 mg | ORAL_TABLET | Freq: Four times a day (QID) | ORAL | 0 refills | Status: DC | PRN

## 2018-05-15 MED ORDER — PANTOPRAZOLE SODIUM 20 MG PO TBEC: 20.0000 mg | DELAYED_RELEASE_TABLET | Freq: Two times a day (BID) | ORAL | 0 refills | Status: DC

## 2018-05-15 NOTE — ED Triage Notes (Signed)
Pt has been having central chest pain as well as nausea and weakness. States she has had this pain before, but it hasn't been consistent. Also states after she eats she gets nauseous. Diagnosed with pseudo seizures. Also states cramps in calves. Is on blood thinners for a clotting disorder. Takes Coumadin.

## 2018-05-16 NOTE — ED Notes (Signed)
Pt ambulatory to waiting room. Pt verbalized understanding of discharge instructions.   

## 2018-05-18 ENCOUNTER — Ambulatory Visit (HOSPITAL_COMMUNITY): Payer: 59 | Admitting: Internal Medicine

## 2018-05-18 ENCOUNTER — Encounter (HOSPITAL_COMMUNITY): Payer: Self-pay | Admitting: Internal Medicine

## 2018-05-18 ENCOUNTER — Inpatient Hospital Stay (HOSPITAL_COMMUNITY): Payer: 59

## 2018-05-18 ENCOUNTER — Inpatient Hospital Stay (HOSPITAL_COMMUNITY): Payer: 59 | Admitting: Internal Medicine

## 2018-05-18 VITALS — BP 111/53 | HR 75 | Temp 99.2°F | Resp 14 | Wt 157.4 lb

## 2018-05-18 DIAGNOSIS — R109 Unspecified abdominal pain: Secondary | ICD-10-CM

## 2018-05-18 DIAGNOSIS — Z832 Family history of diseases of the blood and blood-forming organs and certain disorders involving the immune mechanism: Secondary | ICD-10-CM

## 2018-05-18 DIAGNOSIS — R1084 Generalized abdominal pain: Secondary | ICD-10-CM

## 2018-05-18 DIAGNOSIS — G40909 Epilepsy, unspecified, not intractable, without status epilepticus: Secondary | ICD-10-CM

## 2018-05-18 DIAGNOSIS — I82492 Acute embolism and thrombosis of other specified deep vein of left lower extremity: Secondary | ICD-10-CM

## 2018-05-18 DIAGNOSIS — R7989 Other specified abnormal findings of blood chemistry: Secondary | ICD-10-CM

## 2018-05-18 DIAGNOSIS — K589 Irritable bowel syndrome without diarrhea: Secondary | ICD-10-CM

## 2018-05-18 DIAGNOSIS — O2230 Deep phlebothrombosis in pregnancy, unspecified trimester: Secondary | ICD-10-CM

## 2018-05-18 DIAGNOSIS — G43909 Migraine, unspecified, not intractable, without status migrainosus: Secondary | ICD-10-CM

## 2018-05-18 DIAGNOSIS — Z86718 Personal history of other venous thrombosis and embolism: Secondary | ICD-10-CM | POA: Diagnosis not present

## 2018-05-18 DIAGNOSIS — R760 Raised antibody titer: Secondary | ICD-10-CM

## 2018-05-18 DIAGNOSIS — Z803 Family history of malignant neoplasm of breast: Secondary | ICD-10-CM

## 2018-05-18 DIAGNOSIS — R0602 Shortness of breath: Secondary | ICD-10-CM | POA: Diagnosis not present

## 2018-05-18 DIAGNOSIS — R634 Abnormal weight loss: Secondary | ICD-10-CM

## 2018-05-18 LAB — APTT: aPTT: 40 seconds — ABNORMAL HIGH (ref 24–36)

## 2018-05-18 NOTE — Patient Instructions (Signed)
[facility] Discharge Instructions  You saw Dr. Higgs today.   Thank you for choosing [facility] to provide your oncology and hematology care.  To afford each patient quality time with our provider, please arrive at least 15 minutes before your scheduled appointment time.   If you have a lab appointment with the Cancer Center please come in thru the  Main Entrance and check in at the main information desk  You need to re-schedule your appointment should you arrive 10 or more minutes late.  We strive to give you quality time with our providers, and arriving late affects you and other patients whose appointments are after yours.  Also, if you no show three or more times for appointments you may be dismissed from the clinic at the providers discretion.     Again, thank you for choosing [facility].  Our hope is that these requests will decrease the amount of time that you wait before being seen by our physicians.       _____________________________________________________________  Should you have questions after your visit to [facility], please contact our office at [phone] between the hours of 8:00 a.m. and 4:30 p.m.  Voicemails left after 4:00 p.m. will not be returned until the following business day.  For prescription refill requests, have your pharmacy contact our office and allow 72 hours.    Cancer Center Support Programs:   > Cancer Support Group  2nd Tuesday of the month 1pm-2pm, Journey Room    

## 2018-05-18 NOTE — Progress Notes (Signed)
Diagnosis DVT (deep vein thrombosis) in pregnancy ([place]) - Plan: APTT  Shortness of breath - Plan: CT CHEST W CONTRAST  Abnormal weight loss - Plan: CT Abdomen Pelvis W Contrast  Deep vein thrombosis (DVT) of other vein of left lower extremity, unspecified chronicity (HCC) - Plan: Factor 5 leiden, Prothrombin gene mutation, Lupus anticoagulant panel, Beta-2-glycoprotein i abs, IgG/M/A, ANA, IFA (with reflex)  Generalized abdominal pain - Plan: Factor 5 leiden, Prothrombin gene mutation, Lupus anticoagulant panel, Beta-2-glycoprotein i abs, IgG/M/A, ANA, IFA (with reflex)  Staging Cancer Staging No matching staging information was found for the patient.  Assessment and Plan:  1.  Reported history of lupus anticoagulant with left lower extremity DVT.  This reportedly was diagnosed in 2017 or 18.  Records unavailable for review at initial evaluation.  We have obtained some records from Dr. Reece Levy office in [place].  Review of records indicate + lupus anticoagulant would have been affected by Xarelto.  Hypercoagulable labs were indicated that they were ordered, but no results are included in outside records.    Left LE doppler done 05/01/2018 was negative for DVT.  Labs done 05/15/2018 reviewed and showed WBC 7.1 HB 14.3 plts 192,000.  Chemistries WNL with K+ 3.5, Cr 0.6.  INR is 2.  She is requesting refills on Coumadin which she reports she takes 7.5 alternating with 5.  I have discussed with her she can continue coumadin for now.  Monthly INR should be adequate.  She is referred to coumadin clinic for management.   She will RTC for follow-up to go over labs.   2.  Abdominal pain/SOB.  She reports LMP was 04/24/2018.  She is referred to GYN.  She is set up for CT CAP based on history and abdominal symptoms.  She will RTC to go over results.  She is also referred to GYN for evaluation and discussion of contraceptive options as she reports she is currently on OCP RX by [facility].   Pulse is 97% on room air.    3.  Elevated LFTs.  She has concerns regarding minimal elevation in LFTs on labs done 03/2018 with AST 42 and ALT of 35 which is likely not clinically significant.  This is likely a normal varient or due to medications.  She is set up for CT of abdomen and pelvis for further evaluation.    4.  Seizure disorder/migraines.  She should follow-up with PCP or neurology for evaluation.  5.  IBS.  Patient reportedly has undergone EGD and colonoscopy in the past.  She is set up for CT abdomen and pelvis due to abdominal complaints.  She will follow-up to go over results.  Follow-up with PCP and GI as recommended.   6.  Conversion disorder/mood disorder.  Follow-up with PCP or psychiatry as recommended.  7.  Family history of breast cancer and factor V Leiden.  Will check Factor V Leiden today in clinic.    Greater than 30 minutes spent with more than 50% spent in counseling and coordination of care.    Interval History:  22 year old female who has moved here from [place].  She reportedly was diagnosed with a left lower extremity DVT in 2017 or 2018.  She was followed by Dr. Samson Frederic of hematology in [place].  No records are available for review but report from [facility] shows labs done 08/20/2016 showed a PT of 70 with a lupus anticoagulant of 53 and a dilute Russell viper venom time of 78.2.  Interpretation of results  was reported as consistent with lupus anticoagulant.  According to the patient testing was not done while she was on anticoagulation.  She was reportedly transitioned from Xarelto to Coumadin.  She is also on progesterone only birth control pills and reports she was followed by OB GYN in [place].  She has been on with factor V Leiden deficiency and that on also was diagnosed with breast cancer at the age of 38.  She had recent blood work done 04/14/2018 that showed a white count of 5.9 hemoglobin 14.5 platelets 242,000 chemistries were within  normal limits with a creatinine of 0.59 liver function tests were normal at 42 AST ALT was 35.    Current Status:  Pt is seen today for follow-up.  She is here to go over doppler and labs.  She reports concerns about elevated LFTS.  She also reports abdominal pain.    Problem List Patient Active Problem List   Diagnosis Date Noted  . Bipolar disorder ([place]) [F31.9] 04/14/2018  . Lupus anticoagulant disorder ([place]) [D68.62] 04/14/2018  . Seizure-like activity ([place]) [R56.9] 04/14/2018  . Migraine without status migrainosus, not intractable [G43.909] 04/14/2018  . Chronic low back pain [M54.5, G89.29] 04/12/2018  . History of DVT of lower extremity [Z86.718] 04/12/2018  . Legal blindness [H54.8] 04/12/2018  . POTS (postural orthostatic tachycardia syndrome) [R00.0, I95.1] 04/12/2018  . Conversion disorder [F44.9] 04/12/2018  . IBS (irritable bowel syndrome) [K58.9] 04/12/2018  . 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 Dextroscoliosis [M41.80] 04/12/2018    Past Medical History Past Medical History:  Diagnosis Date  . Bipolar 2 disorder ([place])   . Conversion disorder   . DVT (deep venous thrombosis) (HCC)    LLE  . IBS (irritable bowel syndrome)   . Legal blindness    right eye  . Migraine   . Mood disorder ([place])   . POTS (postural orthostatic tachycardia syndrome)   . Psychogenic nonepileptic seizure     Past Surgical History Past Surgical History:  Procedure Laterality Date  . COLONOSCOPY WITH ESOPHAGOGASTRODUODENOSCOPY (EGD)    . HYMENECTOMY      Family History Family History  Problem Relation Age of Onset  . Anxiety disorder Mother   . Depression Mother   . Hyperlipidemia Mother   . Thyroid disease Maternal Grandmother   . Deep vein thrombosis Maternal Grandmother   . Thyroid disease Maternal Grandfather   . Anxiety disorder Father   . Depression Father   . Autism Sister   . Other Sister        [place] syndrome  . Factor V Leiden  deficiency Maternal Aunt   . Breast cancer Maternal Aunt   . Diabetes Paternal Grandfather   . Deep vein thrombosis Maternal Aunt      Social History  reports that she has never smoked. She has never used smokeless tobacco. She reports that she does not drink alcohol or use drugs.  Medications  Current Outpatient Medications:  .  ALPRAZolam (XANAX) 0.5 MG tablet, Take 0.5 mg by mouth 3 (three) times daily as needed for anxiety., Disp: , Rfl:  .  Brexpiprazole (REXULTI) 0.5 MG TABS, Take 0.5 mg by mouth every evening., Disp: , Rfl:  .  escitalopram (LEXAPRO) 20 MG tablet, Take 20 mg by mouth every morning. , Disp: , Rfl:  .  hydroxypropyl methylcellulose / hypromellose (ISOPTO TEARS / GONIOVISC) 2.5 % ophthalmic solution, Place 1 drop into both eyes 3 (three) times daily as needed for dry eyes., Disp: , Rfl:  .  hyoscyamine (LEVSIN, ANASPAZ) 0.125 MG tablet, Take 0.125 mg by mouth every 6 (six) hours as needed for bladder spasms or cramping. , Disp: , Rfl:  .  lamoTRIgine (LAMICTAL) 25 MG tablet, Take 50 mg by mouth every morning. , Disp: , Rfl:  .  ondansetron (ZOFRAN) 4 MG tablet, Take 1 tablet (4 mg total) by mouth every 6 (six) hours as needed for nausea or vomiting., Disp: 12 tablet, Rfl: 0 .  pantoprazole (PROTONIX) 20 MG tablet, Take 1 tablet (20 mg total) by mouth 2 (two) times daily before a meal., Disp: 30 tablet, Rfl: 0 .  temazepam (RESTORIL) 7.5 MG capsule, Take 7.5 mg by mouth at bedtime. , Disp: , Rfl:  .  traMADol (ULTRAM) 50 MG tablet, Take 50 mg by mouth every morning. , Disp: , Rfl:  .  warfarin (COUMADIN) 5 MG tablet, Take 5-7.5 mg by mouth See admin instructions. Take '5MG'$  on tues, thur, sat and sun, then take 7.'5mg'$  on all other days, Disp: , Rfl:   Allergies Amitriptyline and Sulfa antibiotics  Review of Systems Review of Systems - Oncology ROS negative other than abdominal pain, SOB.     Physical Exam  Vitals Wt Readings from Last 3 Encounters:  05/01/18 146  lb (66.2 kg)  04/20/18 151 lb (68.5 kg)  04/14/18 153 lb (69.4 kg)   Temp Readings from Last 3 Encounters:  05/15/18 98 F (36.7 C) (Oral)  05/01/18 97.9 F (36.6 C) (Oral)  04/20/18 98.6 F (37 C) (Oral)   BP Readings from Last 3 Encounters:  05/16/18 117/72  05/01/18 99/60  04/20/18 103/62   Pulse Readings from Last 3 Encounters:  05/16/18 70  05/01/18 64  04/20/18 82   Constitutional: Well-developed, well-nourished, and in no distress.   HENT: Head: Normocephalic and atraumatic.  Mouth/Throat: No oropharyngeal exudate. Mucosa moist. Eyes: Pupils are equal, round, and reactive to light. Conjunctivae are normal. No scleral icterus.  Neck: Normal range of motion. Neck supple. No JVD present.  Cardiovascular: Normal rate, regular rhythm and normal heart sounds.  Exam reveals no gallop and no friction rub.   No murmur heard. Pulmonary/Chest: Effort normal and breath sounds normal. No respiratory distress. No wheezes.No rales.  Abdominal: Soft. Bowel sounds are normal. No distension. Tender in LUQ, no guarding.   Musculoskeletal: No edema or tenderness.  Lymphadenopathy: No cervical, axillaryor supraclavicular adenopathy.  Neurological: Alert and oriented to person, place, and time. No cranial nerve deficit.  Skin: Skin is warm and dry. No rash noted. No erythema. No pallor.  Psychiatric: Affect and judgment normal.   Labs Appointment on 05/18/2018  Component Date Value Ref Range Status  . aPTT 05/18/2018 40* 24 - 36 seconds Final   Comment:        IF BASELINE aPTT IS ELEVATED, SUGGEST PATIENT RISK ASSESSMENT BE USED TO DETERMINE APPROPRIATE ANTICOAGULANT THERAPY. Performed at [facility], [address], [place], [place] [address]   Admission on 05/15/2018, Discharged on 05/16/2018  Component Date Value Ref Range Status  . Sodium 05/15/2018 139  135 - 145 mmol/L Final  . Potassium 05/15/2018 3.5  3.5 - 5.1 mmol/L Final  . Chloride 05/15/2018 103  98 - 111 mmol/L  Final  . CO2 05/15/2018 28  22 - 32 mmol/L Final  . Glucose, Bld 05/15/2018 92  70 - 99 mg/dL Final  . BUN 05/15/2018 10  6 - 20 mg/dL Final  . Creatinine, Ser 05/15/2018 0.60  0.44 - 1.00 mg/dL Final  . Calcium 05/15/2018  9.2  8.9 - 10.3 mg/dL Final  . GFR calc non Af Amer 05/15/2018 >60  >60 mL/min Final  . GFR calc Af Amer 05/15/2018 >60  >60 mL/min Final   Comment: (NOTE) The eGFR has been calculated using the CKD EPI equation. This calculation has not been validated in all clinical situations. eGFR's persistently <60 mL/min signify possible Chronic Kidney Disease.   Georgiann Hahn gap 05/15/2018 8  5 - 15 Final   Performed at [facility], [address] [place], [place] [address]  . WBC 05/15/2018 7.1  4.0 - 10.5 K/uL Final  . RBC 05/15/2018 4.61  3.87 - 5.11 MIL/uL Final  . Hemoglobin 05/15/2018 14.3  12.0 - 15.0 g/dL Final  . HCT 05/15/2018 42.1  36.0 - 46.0 % Final  . MCV 05/15/2018 91.3  78.0 - 100.0 fL Final  . MCH 05/15/2018 31.0  26.0 - 34.0 pg Final  . MCHC 05/15/2018 34.0  30.0 - 36.0 g/dL Final  . RDW 05/15/2018 12.2  11.5 - 15.5 % Final  . Platelets 05/15/2018 192  150 - 400 K/uL Final   Performed at [facility], [address], [place], [place] [address]  . Troponin I 05/15/2018 <0.03  <0.03 ng/mL Final   Performed at [facility], [address], [place], [place] [address]  . Prothrombin Time 05/15/2018 22.8* 11.4 - 15.2 seconds Final  . INR 05/15/2018 2.03   Final   Performed at [facility], [address], [place], [place] [address]  . hCG, Beta Chain, Quant, S 05/15/2018 <1  <5 mIU/mL Final   Comment:          GEST. AGE      CONC.  (mIU/mL)   <=1 WEEK        5 - 50     2 WEEKS       50 - 500     3 WEEKS       100 - 10,000     4 WEEKS     1,000 - 30,000     5 WEEKS     3,500 - 115,000   6-8 WEEKS     12,000 - 270,000    12 WEEKS     15,000 - 220,000        FEMALE AND NON-PREGNANT FEMALE:     LESS THAN 5 mIU/mL Performed at [facility], [address],  [place],  [address]      Pathology Orders Placed This Encounter  Procedures  . CT CHEST W CONTRAST    Standing Status:   Future    Standing Expiration Date:   05/18/2019    Order Specific Question:   If indicated for the ordered procedure, I authorize the administration of contrast media per Radiology protocol    Answer:   Yes    Order Specific Question:   Preferred imaging location?    Answer:   [facility]    Order Specific Question:   Radiology Contrast Protocol - do NOT remove file path    Answer:   \\[email or web address]  . CT Abdomen Pelvis W Contrast    Standing Status:   Future    Standing Expiration Date:   05/18/2019    Order Specific Question:   ** REASON FOR EXAM (FREE TEXT)    Answer:   abdominal pain    Order Specific Question:   If indicated for the ordered procedure, I authorize the administration of contrast media per Radiology protocol    Answer:  Yes    Order Specific Question:   Is patient pregnant?    Answer:   No    Order Specific Question:   Preferred imaging location?    Answer:   [facility]    Order Specific Question:   Is Oral Contrast requested for this exam?    Answer:   Yes, Per Radiology protocol    Order Specific Question:   Radiology Contrast Protocol - do NOT remove file path    Answer:   \\[email or web address]  . Factor 5 leiden    Standing Status:   Future    Number of Occurrences:   1    Standing Expiration Date:   05/19/2019  . Prothrombin gene mutation    Standing Status:   Future    Number of Occurrences:   1    Standing Expiration Date:   05/19/2019  . Lupus anticoagulant panel    Standing Status:   Future    Number of Occurrences:   1    Standing Expiration Date:   05/19/2019  . Beta-2-glycoprotein i abs, IgG/M/A    Standing Status:   Future    Number of Occurrences:   1    Standing Expiration Date:   05/19/2019  . ANA, IFA (with reflex)    Standing Status:   Future     Number of Occurrences:   1    Standing Expiration Date:   05/19/2019  . APTT    Standing Status:   Future    Number of Occurrences:   1    Standing Expiration Date:   05/19/2019       Zoila Shutter MD

## 2018-05-19 ENCOUNTER — Ambulatory Visit: Payer: 59 | Admitting: Family Medicine

## 2018-05-19 LAB — ANTINUCLEAR ANTIBODIES, IFA: ANTINUCLEAR ANTIBODIES, IFA: NEGATIVE

## 2018-05-19 LAB — BETA-2-GLYCOPROTEIN I ABS, IGG/M/A

## 2018-05-20 LAB — LUPUS ANTICOAGULANT PANEL
DRVVT: 51.3 s — ABNORMAL HIGH (ref 0.0–47.0)
PTT LA: 50.8 s (ref 0.0–51.9)

## 2018-05-20 LAB — DRVVT MIX: dRVVT Mix: 35.7 s (ref 0.0–47.0)

## 2018-05-23 ENCOUNTER — Ambulatory Visit (HOSPITAL_COMMUNITY): Payer: 59 | Admitting: Internal Medicine

## 2018-05-23 NOTE — ED Provider Notes (Signed)
[facility] EMERGENCY DEPARTMENT Provider Note   CSN: [id number] Arrival date & time: 05/15/18  1831     History   Chief Complaint No chief complaint on file.   HPI Samantha Dougherty is a 22 y.o. female.  HPI   22 year old female with upper abdominal/lower sternal pain.  She has had this pain multiple times previously but feels like it is been getting worse.  She has not noticed any consistent exacerbating relieving factors.  Sometimes feels nauseated with it but not always.  No vomiting.  No fevers or chills.  She recently relocated to this area from [place].  Reports some prior work-up of the symptoms there.  She remembers being told that she has a polyp on her gallbladder.  Denies significant NSAID usage.  Denies significant alcohol.  No prior abdominal surgeries.  Past Medical History:  Diagnosis Date  . Bipolar 2 disorder (HCC)   . Conversion disorder   . DVT (deep venous thrombosis) (HCC)    LLE  . IBS (irritable bowel syndrome)   . Legal blindness    right eye  . Migraine   . Mood disorder (HCC)   . POTS (postural orthostatic tachycardia syndrome)   . Psychogenic nonepileptic seizure     Patient Active Problem List   Diagnosis Date Noted  . Bipolar disorder (HCC) 04/14/2018  . 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  . Seizure-like activity (HCC) 04/14/2018  . Migraine without status migrainosus, not intractable 04/14/2018  . Chronic low back pain 04/12/2018  . History of DVT of lower extremity 04/12/2018  . 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 POTS (postural orthostatic tachycardia syndrome) 04/12/2018  . Conversion disorder 04/12/2018  . IBS (irritable bowel syndrome) 04/12/2018  . 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 Dextroscoliosis 04/12/2018    Past Surgical History:  Procedure Laterality Date  . COLONOSCOPY WITH ESOPHAGOGASTRODUODENOSCOPY (EGD)    . HYMENECTOMY       OB History   None      Home Medications    Prior to  Admission medications   Medication Sig Start Date End Date Taking? Authorizing Provider  ALPRAZolam Prudy Feeler) 0.5 MG tablet Take 0.5 mg by mouth 3 (three) times daily as needed for anxiety.   Yes [provider]  Brexpiprazole (REXULTI) 0.5 MG TABS Take 0.5 mg by mouth every evening.   Yes [provider]  escitalopram (LEXAPRO) 20 MG tablet Take 20 mg by mouth every morning.    Yes [provider]  hydroxypropyl methylcellulose / hypromellose (ISOPTO TEARS / GONIOVISC) 2.5 % ophthalmic solution Place 1 drop into both eyes 3 (three) times daily as needed for dry eyes.   Yes [provider]  hyoscyamine (LEVSIN, ANASPAZ) 0.125 MG tablet Take 0.125 mg by mouth every 6 (six) hours as needed for bladder spasms or cramping.    Yes [provider]  lamoTRIgine (LAMICTAL) 25 MG tablet Take 50 mg by mouth every morning.    Yes [provider]  temazepam (RESTORIL) 7.5 MG capsule Take 7.5 mg by mouth at bedtime.    Yes [provider]  traMADol (ULTRAM) 50 MG tablet Take 50 mg by mouth every morning.    Yes [provider]  warfarin (COUMADIN) 5 MG tablet Take 5-7.5 mg by mouth See admin instructions. Take 5MG  on tues, thur, sat and sun, then take 7.5mg  on all other days   Yes [provider]  ondansetron (ZOFRAN) 4 MG tablet Take 1 tablet (4 mg total) by mouth every  6 (six) hours as needed for nausea or vomiting. 05/15/18   Raeford Razor, MD  pantoprazole (PROTONIX) 20 MG tablet Take 1 tablet (20 mg total) by mouth 2 (two) times daily before a meal. 05/15/18   Raeford Razor, MD    Family History Family History  Problem Relation Age of Onset  . Anxiety disorder Mother   . Depression Mother   . Hyperlipidemia Mother   . Thyroid disease Maternal Grandmother   . Deep vein thrombosis Maternal Grandmother   . Thyroid disease Maternal Grandfather   . Anxiety disorder Father   . Depression Father   . Autism Sister   . Other  Sister        [place] syndrome  . Factor V Leiden deficiency Maternal Aunt   . Breast cancer Maternal Aunt   . Diabetes Paternal Grandfather   . Deep vein thrombosis Maternal Aunt     Social History Social History   Tobacco Use  . Smoking status: Never Smoker  . Smokeless tobacco: Never Used  Substance Use Topics  . Alcohol use: Never    Frequency: Never  . Drug use: Never     Allergies   Amitriptyline and Sulfa antibiotics   Review of Systems Review of Systems  All systems reviewed and negative, other than as noted in HPI.  Physical Exam Updated Vital Signs BP 117/72 (BP Location: Right Arm)   Pulse 70   Temp 98 F (36.7 C) (Oral)   Resp 15   Ht 5\' 3"  (1.6 m)   LMP 04/24/2018   SpO2 99%   BMI 25.86 kg/m   Physical Exam  Constitutional: She appears well-developed and well-nourished. No distress.  HENT:  Head: Normocephalic and atraumatic.  Eyes: Conjunctivae are normal. Right eye exhibits no discharge. Left eye exhibits no discharge.  Neck: Neck supple.  Cardiovascular: Normal rate, regular rhythm and normal heart sounds. Exam reveals no gallop and no friction rub.  No murmur heard. Pulmonary/Chest: Effort normal and breath sounds normal. No respiratory distress.  Abdominal: Soft. She exhibits no distension. There is no tenderness.  Musculoskeletal: She exhibits no edema or tenderness.  Neurological: She is alert.  Skin: Skin is warm and dry.  Psychiatric: She has a normal mood and affect. Her behavior is normal. Thought content normal.  Nursing note and vitals reviewed.    ED Treatments / Results  Labs (all labs ordered are listed, but only abnormal results are displayed) Labs Reviewed  PROTIME-INR - Abnormal; Notable for the following components:      Result Value   Prothrombin Time 22.8 (*)    All other components within normal limits  BASIC METABOLIC PANEL  CBC  TROPONIN I  HCG, QUANTITATIVE, PREGNANCY    EKG EKG  Interpretation  Date/Time:  Monday May 15 2018 18:51:17 EDT Ventricular Rate:  79 PR Interval:  118 QRS Duration: 78 QT Interval:  388 QTC Calculation: 444 R Axis:   70 Text Interpretation:  Normal sinus rhythm T wave abnormality, consider anterior ischemia Abnormal ECG Artifact No old tracing to compare Confirmed by Drema Pry [phone]) on 05/16/2018 8:29:36 PM   Radiology No results found.  Procedures Procedures (including critical care time)  Medications Ordered in ED Medications - No data to display   Initial Impression / Assessment and Plan / ED Course  I have reviewed the triage vital signs and the nursing notes.  Pertinent labs & imaging results that were available during my care of the patient were reviewed by me and considered  in my medical decision making (see chart for details).     I have reviewed the triage vital signs and the nursing notes. Prior records were reviewed for additional information.    Pertinent labs & imaging results that were available during my care of the patient were reviewed by me and considered in my medical decision making (see chart for details).  22 year old female with intermittent epigastric pain.  Her exam is pretty reassuring.  Symptoms have been ongoing for at least months intermittently with prior evaluation for the same.  I doubt emergent process.  I doubt this gallbladder polyp is actually causing her symptoms.  We will give her a trial of PPI.  I doubt emergent process.  She is in the process of establishing care here locally.  Given the duration of this abdominal pain at this point, I feel that it might be worthwhile to see a gastroenterologist.  Final Clinical Impressions(s) / ED Diagnoses   Final diagnoses:  Epigastric pain    ED Discharge Orders         Ordered    pantoprazole (PROTONIX) 20 MG tablet  2 times daily before meals     05/15/18 2346    ondansetron (ZOFRAN) 4 MG tablet  Every 6 hours PRN     05/15/18 2346            Raeford Razor, MD 05/23/18 [phone]

## 2018-05-24 LAB — FACTOR 5 LEIDEN

## 2018-05-24 LAB — PROTHROMBIN GENE MUTATION

## 2018-05-25 ENCOUNTER — Encounter: Payer: 59 | Admitting: Obstetrics and Gynecology

## 2018-05-29 NOTE — Progress Notes (Signed)
Psychiatric Initial Adult Assessment   Patient Identification: Samantha Dougherty MRN:  [id number] Date of Evaluation:  06/05/2018 Referral Source: Self Chief Complaint:   Chief Complaint    Depression; Psychiatric Evaluation     Visit Diagnosis:    ICD-10-CM   1. MDD (major depressive disorder), recurrent episode, moderate (HCC) F33.1     History of Present Illness:   Samantha Dougherty is a 22 y.o. year old female with a history of bipolar II disorder/mood disorder, conversion disorder by chart, non epileptic seizure, Lupus anticoagulant disorder with history of DVT, who is referred for bipolar disorder.   She is accompanied by her mother, Samantha Dougherty.   She states that she has been struggling with depression and anxiety since age 52.  She moved from [place] to [place] 2 months ago.  She has had a difficult time adjusting as she never moved before.  It is also difficult from changing the life style of [place] city [place]. She does not feel "home" yet. She endorses significant fatigue, depression and tends to stay at home, listening to radio. She talks about challenges in high school due to her physical condition. She states that it was "traumatic" that she lost her weight to 90 lbs when she was "bed ridden," which she attributed to Lyme disease, which was diagnosed at age 59. She lost her friends as they did not want to deal with her illness. This experience makes her having difficulty in trusting other people. She also feels that she missed to have teenage life. She graduated from high school last year and feels proud, and wants it to be broad casted (and smiles). She reports frustration that people do not understand her medical condition while she is young. She has had difficulty in doing exercise more than ten minutes. She will have shortness of breath, muscle ache. She complains of significant abdominal pain, nausea, and loose stool after she eats. She denies any worsening  in these symptoms lately. 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  She cannot even do house chores such as washing dishes as the noise of dishes makes her "ear hurt." She "desperately want to live average life." She reports great relationship with her younger sister, age 56. They may find things to enjoy in the house, referring some financial strain.  Samantha Dougherty has SI around menstrual cycle. She denies any previous attempt. Although she denies any decreased need for sleep, euphoria, or impulsive behavior, she was diagnosed with bipolar disorder. Samantha Dougherty thinks that it may be secondary to her mentioning "mood swing" of feeing "good (euthymic)" to depression. She has been on Lamictal, Rexulti, Xanax. She found Lamictal to be very helpful. She was tapered off temazepam and tramadol as she ran out her medication; she felt better after being off this medication. She denies alcohol use or drug use. See other ROS as below.  Patient mother, Samantha Dougherty presents to the interview . 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 She had some lung issues until 22 year old. Although she was doing fairly well from age [age], she started to be "bed bound" after puberty. She was later found to have Lyme disease. Over time, Samantha Dougherty has had difficulty in "making choices." Samantha Dougherty states that she has struggled about where she belongs. She would often tell that Samantha Dougherty has 22 year old mindset while she is 22 year old. Samantha Dougherty wants [employer]  to be more independent. Samantha Dougherty is currently applying for disability. She is waiting for another year of residence to apply school.    Wt Readings from Last 3 Encounters:  06/05/18 152 lb (68.9 kg)  05/31/18 152 lb (68.9 kg)  05/19/18 157 lb 6.4 oz (71.4 kg)    Per PMP,  On tramadol, temazepam, xanax filled on 04/11/2018   Associated Signs/Symptoms: Depression Symptoms:  depressed mood, anhedonia, hypersomnia, fatigue, (Hypo) Manic Symptoms:   denies decreased need for sleep, euphoria Anxiety Symptoms:  mild anxiety, some panic attacks Psychotic Symptoms:  denies paranoia, VH of faces, animals in 2016 for one year, denies AH  PTSD Symptoms: Had a traumatic exposure:  bullied 5-8th grade Re-experiencing:  None Hypervigilance:  No Hyperarousal:  None Avoidance:  Decreased Interest/Participation   Past Psychiatric History:  Outpatient: Dr. Jettie Pagan since 2015, for conversion disorder, r/o bipolar disorder, therapy for 4 years Psychiatry admission: denies  Previous suicide attempt: denies  Past trials of medication:  Lexapro, elavil (hallucinations),  rexulti, Abilify, Xanax, lamotrigine, temazepam History of violence:   Previous Psychotropic Medications: Yes   Substance Abuse History in the last 12 months:  No.  Consequences of Substance Abuse: NA  Past Medical History:  Past Medical History:  Diagnosis Date  . Anxiety   . Asthma   . Bipolar 2 disorder (HCC)   . Conversion disorder   . Depression   . DVT (deep venous thrombosis) (HCC)    LLE  . Fibromyalgia   . Gallbladder polyp   . H. pylori infection   . IBS (irritable bowel syndrome)   . Legal blindness    right eye  . Migraine   . Mood disorder (HCC)   . POTS (postural orthostatic tachycardia syndrome)   . Psychogenic nonepileptic seizure   . Urinary tract infection    off and on x years    Past Surgical History:  Procedure Laterality Date  . COLONOSCOPY WITH ESOPHAGOGASTRODUODENOSCOPY (EGD)    . HYMENECTOMY  2016    Family Psychiatric History:  As below  Family History:  Family History  Problem Relation Age of Onset  . Anxiety disorder Mother   . Depression Mother   . Hyperlipidemia Mother   . Bipolar disorder Mother   . Thyroid disease Maternal Grandmother   . Deep vein thrombosis Maternal Grandmother   . Thyroid disease Maternal Grandfather   . Anxiety disorder Father   . Depression Father   . Autism Sister   . Other Sister         [place] syndrome  . Factor V Leiden deficiency Maternal Aunt   . Breast cancer Maternal Aunt   . Bipolar disorder Maternal Aunt   . Diabetes Paternal Grandfather   . Deep vein thrombosis Maternal Aunt   . Bipolar disorder Maternal Aunt   . Colon cancer Neg Hx   . Esophageal cancer Neg Hx   . Rectal cancer Neg Hx     Social History:   Social History   Socioeconomic History  . Marital status: Single    Spouse name: Not on file  . Number of children: 0  . Years of education: Not on file  . Highest education level: Not on file  Occupational History  . Occupation: Consulting civil engineer  Social Needs  . Financial resource strain: Not on file  . Food insecurity:    Worry: Not on file    Inability: Not on file  . Transportation needs:    Medical: Not on file  Non-medical: Not on file  Tobacco Use  . Smoking status: Never Smoker  . Smokeless tobacco: Never Used  Substance and Sexual Activity  . Alcohol use: Never    Frequency: Never  . Drug use: Never  . Sexual activity: Not Currently  Lifestyle  . Physical activity:    Days per week: Not on file    Minutes per session: Not on file  . Stress: Not on file  Relationships  . Social connections:    Talks on phone: Not on file    Gets together: Not on file    Attends religious service: Not on file    Active member of club or organization: Not on file    Attends meetings of clubs or organizations: Not on file    Relationship status: Not on file  Other Topics Concern  . Not on file  Social History Narrative   From [place].  Was receiving medical care at [facility].  Resides with mother.    Additional Social History:  She lives with her parents, younger sister. She moved from [place] to [place] two months ago.  Single, no children.  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 She had some lung issues until 22 year old. Although she was doing fairly well from age [age], she started to be "bed bound" after puberty. She was later found to have Lyme disease. She was bullied  at school, which she attribute to her physical condition. She graduated from high school at age 66.    Allergies:   Allergies  Allergen Reactions  . Amitriptyline     Hallucinations  . Sulfa Antibiotics     Metabolic Disorder Labs: No results found for: HGBA1C, MPG No results found for: PROLACTIN Lab Results  Component Value Date   CHOL 171 04/14/2018   TRIG 140 04/14/2018   HDL 50 04/14/2018   CHOLHDL 3.4 04/14/2018   LDLCALC 93 04/14/2018     Current Medications: Current Outpatient Medications  Medication Sig Dispense Refill  . ALPRAZolam (XANAX) 0.5 MG tablet Take 1 tablet (0.5 mg total) by mouth 3 (three) times daily as needed for anxiety. 90 tablet 0  . Brexpiprazole (REXULTI) 0.5 MG TABS Take 1 tablet (0.5 mg total) by mouth every evening. 30 tablet 0  . hydroxypropyl methylcellulose / hypromellose (ISOPTO TEARS / GONIOVISC) 2.5 % ophthalmic solution Place 1 drop into both eyes 3 (three) times daily as needed for dry eyes.    . hyoscyamine (LEVSIN, ANASPAZ) 0.125 MG tablet Take 0.125 mg by mouth every 6 (six) hours as needed for bladder spasms or cramping.     . lamoTRIgine (LAMICTAL) 25 MG tablet Take 2 tablets (50 mg total) by mouth every morning. 60 tablet 0  . ondansetron (ZOFRAN) 4 MG tablet Take 1 tablet (4 mg total) by mouth every 6 (six) hours as needed for nausea or vomiting. 12 tablet 0  . pantoprazole (PROTONIX) 20 MG tablet Take 1 tablet (20 mg total) by mouth 2 (two) times daily before a meal. 30 tablet 0  . warfarin (COUMADIN) 5 MG tablet Take 5-7.5 mg by mouth See admin instructions. Take 5MG  on tues, thur, sat and sun, then take 7.5mg  on all other days    . DULoxetine (CYMBALTA) 30 MG capsule 30 mg daily for one week, then 60 mg daily 60 capsule 0   No current facility-administered medications for this visit.     Neurologic: Headache: No Seizure: No Paresthesias:No  Musculoskeletal: Strength & Muscle Tone: within normal limits Gait &  Station:  normal Patient leans: N/A  Psychiatric Specialty Exam: Review of Systems  Musculoskeletal: Positive for myalgias.  Psychiatric/Behavioral: Positive for depression. Negative for hallucinations, memory loss, substance abuse and suicidal ideas. The patient is nervous/anxious and has insomnia.   All other systems reviewed and are negative.   Blood pressure 122/70, pulse 75, height 5\' 3"  (1.6 m), weight 152 lb (68.9 kg), SpO2 97 %.Body mass index is 26.93 kg/m.  General Appearance: Casual and Well Groomed  Eye Contact:  Good  Speech:  Clear and Coherent  Volume:  Normal  Mood:  Depressed  Affect:  Appropriate, Congruent and down at times  Thought Process:  Coherent  Orientation:  Full (Time, Place, and Person)  Thought Content:  Logical  Suicidal Thoughts:  No  Homicidal Thoughts:  No  Memory:  Immediate;   Good  Judgement:  Good  Insight:  Fair  Psychomotor Activity:  Normal  Concentration:  Concentration: Good and Attention Span: Good  Recall:  Good  Fund of Knowledge:Good  Language: Good  Akathisia:  No  Handed:  Right  AIMS (if indicated):  N/A  Assets:  Communication Skills Desire for Improvement  ADL's:  Intact  Cognition: WNL  Sleep:  poor   Assessment Samantha Dougherty is a 22 y.o. year old female with a history of bipolar II disorder/mood disorder, conversion disorder by chart, non epileptic seizure, Lupus anticoagulant disorder with history of DVT, who is referred for bipolar disorder.   # MDD, moderate, recurrent without psychotic features Patient endorses significant fatigue, neurovegetative symptoms and physical symptoms of shortness of breath, GI symptoms, and heightened sensitivity to noise.  She is demoralized by her current medical condition. She also endorses myalgia, which could be attributable to fibromyalgia, or chronic fatigue syndrome, although we cannot rule out other medical cause. Will switch from Lexapro to duloxetine to see if it helps for her depression  and myalgia.  Discussed potential risk of serotonin syndrome.  Also discussed potential interaction with warfarin.  Will continue lamotrigine at this time for mood dysregulation given patient reports significant benefit from it.  Will continue rexulti as adjunctive treatment for depression.  Will continue Xanax as needed for anxiety.  Noted that although she was diagnosed with bipolar 2 disorder in the past, she denies any significant hypomanic symptoms except some mood swing (from depression to normal).  Will continue to monitor.  She will greatly benefit from IOP, which would also be helpful for her to have structure in the day.  Will make a referral.  She will also benefit from CBT; will make referral for individual therapy.    Plan 1. Decrease lexapro 10 mg daily for one week, then discontinue 2. Start duloxetine 30 mg daily for one week, then 60 mg daily  3. Continue lamotrigine 50 mg daily  4. Continue Rexulti 0.5 mg daily  5. Continue Xanax 0.5 mg three times a day for anxiety 5. Return to clinic in one month for 30 mins 6. Referral to IOP 7. Referral to therapy - obtain record from her prior psychiatrist at the next encounter - TSH wnl per patient  The patient demonstrates the following risk factors for suicide: Chronic risk factors for suicide include: psychiatric disorder of depression and chronic pain. Acute risk factors for suicide include: unemployment and social withdrawal/isolation. Protective factors for this patient include: positive social support, coping skills and hope for the future. Considering these factors, the overall suicide risk at this point appears to be low. Patient is  appropriate for outpatient follow up.  Treatment Plan Summary: Plan as above   Neysa Hotter, MD 9/16/20195:04 PM

## 2018-05-31 ENCOUNTER — Other Ambulatory Visit (INDEPENDENT_AMBULATORY_CARE_PROVIDER_SITE_OTHER): Payer: 59

## 2018-05-31 ENCOUNTER — Encounter (INDEPENDENT_AMBULATORY_CARE_PROVIDER_SITE_OTHER): Payer: Self-pay | Admitting: Orthopedic Surgery

## 2018-05-31 ENCOUNTER — Ambulatory Visit (INDEPENDENT_AMBULATORY_CARE_PROVIDER_SITE_OTHER): Payer: 59 | Admitting: Orthopedic Surgery

## 2018-05-31 ENCOUNTER — Ambulatory Visit (INDEPENDENT_AMBULATORY_CARE_PROVIDER_SITE_OTHER): Payer: Self-pay

## 2018-05-31 ENCOUNTER — Telehealth (INDEPENDENT_AMBULATORY_CARE_PROVIDER_SITE_OTHER): Payer: Self-pay

## 2018-05-31 ENCOUNTER — Encounter: Payer: Self-pay | Admitting: Nurse Practitioner

## 2018-05-31 ENCOUNTER — Ambulatory Visit: Payer: 59 | Admitting: Nurse Practitioner

## 2018-05-31 VITALS — BP 110/68 | HR 76 | Ht 63.0 in | Wt 152.0 lb

## 2018-05-31 DIAGNOSIS — G8929 Other chronic pain: Secondary | ICD-10-CM | POA: Diagnosis not present

## 2018-05-31 DIAGNOSIS — M25562 Pain in left knee: Secondary | ICD-10-CM

## 2018-05-31 DIAGNOSIS — R112 Nausea with vomiting, unspecified: Secondary | ICD-10-CM

## 2018-05-31 DIAGNOSIS — R1084 Generalized abdominal pain: Secondary | ICD-10-CM

## 2018-05-31 DIAGNOSIS — M25362 Other instability, left knee: Secondary | ICD-10-CM

## 2018-05-31 DIAGNOSIS — R140 Abdominal distension (gaseous): Secondary | ICD-10-CM

## 2018-05-31 DIAGNOSIS — M25561 Pain in right knee: Secondary | ICD-10-CM

## 2018-05-31 LAB — HEPATIC FUNCTION PANEL
ALBUMIN: 4.4 g/dL (ref 3.5–5.2)
ALK PHOS: 61 U/L (ref 39–117)
ALT: 14 U/L (ref 0–35)
AST: 18 U/L (ref 0–37)
Bilirubin, Direct: 0.1 mg/dL (ref 0.0–0.3)
TOTAL PROTEIN: 7.5 g/dL (ref 6.0–8.3)
Total Bilirubin: 0.4 mg/dL (ref 0.2–1.2)

## 2018-05-31 LAB — IGA: IgA: 167 mg/dL (ref 68–378)

## 2018-05-31 LAB — SEDIMENTATION RATE: Sed Rate: 34 mm/hr — ABNORMAL HIGH (ref 0–20)

## 2018-05-31 LAB — HIGH SENSITIVITY CRP: CRP, High Sensitivity: 3.89 mg/L (ref 0.000–5.000)

## 2018-05-31 NOTE — Progress Notes (Signed)
Agree with assessment and plan as outlined.  

## 2018-05-31 NOTE — Progress Notes (Signed)
GI Provider:  New patient              Chief Complaint: Abdominal pain, bloating, loss of appetite, chest pain, nausea and vomiting, bowel changes, liver problems,  Referring Provider:  Ronnie Doss, DO     ASSESSMENT AND PLAN;   42. 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 Evaluated in [place] in 2016, diagnosed with IBS. All of her symptoms are exacerbated by stress.  Father asks about Crohn's disease. Doubt IBD, suspect the majority of her symptoms are functional.  Do see that someone has scheduled her for CT scan of the abdomen and pelvis to be done 06/05/2018 -Obtain EGD and colonoscopy reports from [facility] -obtain U/S report (done last week) -CRP, ESR, tTg, IgA - She will need to come back for office visit to review all the results and formulate a plan -Hematologist has scheduled her for a CT scan of chest / abd / pelvis to be done on 06/05/18. This could point [place] in some direction with GI workup depending on results.    2. Mild transamnitis.  Enzymes mildly elevated in late July.  I have no other liver tests to compare.  There was suggestion of fatty liver disease on CT scan in 2018 [facility]). Medication related? -Liver test done in July are the only ones in the EMR.  Will recheck liver tests today to see if still elevated and go from there.  It may be difficult to sort out which, if any of her medications could be responsible for abnormal liver tests since most of her meds can't be stopped abruptly.  Will need to check autoimmune / genetic / viral serologies if liver tests remain elevated.  - will request copy of recent abdominal ultrasound  3. Gallbladder polyp (per patient). Will request report. Probably not surgical issue unless it is large  4. Anxiety / depression / conversion disorder. On several psychiatric medications.    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  She had an upper endoscopy and colonoscopy in [place] approximately 3 years ago, I do not have those results but patient says she was diagnosed with IBS.  Samantha Dougherty is here with her father. They recently relocated here from [place]. She has chronic, constant diffuse abdominal pain worse after eating regardless of what she eats.  She feels bloated all the time, has frequent nausea with occasional vomiting.  Dairy nor wheat make  weight cause any worsening pain.  She has chronic abdominal bloating, frequent nausea with occasional vomiting.  She has taken Levsin for years but does think it helps. She has frequent diarrhea without blood. She averages about 5 loose stools a day, no significant nocturnal stooling. She has had no weight loss in fact her weight is up.   She gives a history of GERD with heartburn.  PCP started PPI a week ago and heartburn is better.  Data:  Labs late August including CBC and pregnancy test were normal.  She did have mild transaminitis on labs late July.  Patient says she had a recent abdominal ultrasound revealing of a gallbladder polyp, I do not have those records available.  December 2018 CT scan of the abdomen and pelvis with  contrast done in [place] for evaluation of abdominal pain was unremarkable except for fatty liver.     Past Medical History:  Diagnosis Date  . Anxiety   . Asthma   . Bipolar 2 disorder ([place])   . Conversion disorder   . Depression   . DVT (deep venous thrombosis) (HCC)    LLE  . Fibromyalgia   . Gallbladder polyp   . IBS (irritable bowel syndrome)   . Legal blindness    right eye  . Migraine   . Mood disorder ([place])   . POTS (postural orthostatic tachycardia  syndrome)   . Psychogenic nonepileptic seizure   . Urinary tract infection    off and on x years     Past Surgical History:  Procedure Laterality Date  . COLONOSCOPY WITH ESOPHAGOGASTRODUODENOSCOPY (EGD)    . HYMENECTOMY  2016   Family History  Problem Relation Age of Onset  . Anxiety disorder Mother   . Depression Mother   . Hyperlipidemia Mother   . Thyroid disease Maternal Grandmother   . Deep vein thrombosis Maternal Grandmother   . Thyroid disease Maternal Grandfather   . Anxiety disorder Father   . Depression Father   . Autism Sister   . Other Sister        [place] syndrome  . Factor V Leiden deficiency Maternal Aunt   . Breast cancer Maternal Aunt   . Diabetes Paternal Grandfather   . Deep vein thrombosis Maternal Aunt   . Colon cancer Neg Hx   . Esophageal cancer Neg Hx   . Rectal cancer Neg Hx    Social History   Tobacco Use  . Smoking status: Never Smoker  . Smokeless tobacco: Never Used  Substance Use Topics  . Alcohol use: Never    Frequency: Never  . Drug use: Never   Current Outpatient Medications  Medication Sig Dispense Refill  . ALPRAZolam (XANAX) 0.5 MG tablet Take 0.5 mg by mouth 3 (three) times daily as needed for anxiety.    . Brexpiprazole (REXULTI) 0.5 MG TABS Take 0.5 mg by mouth every evening.    . escitalopram (LEXAPRO) 20 MG tablet Take 20 mg by mouth every morning.     . hydroxypropyl methylcellulose / hypromellose (ISOPTO TEARS / GONIOVISC) 2.5 % ophthalmic solution Place 1 drop into both eyes 3 (three) times daily as needed for dry eyes.    . hyoscyamine (LEVSIN, ANASPAZ) 0.125 MG tablet Take 0.125 mg by mouth every 6 (six) hours as needed for bladder spasms or cramping.     . lamoTRIgine (LAMICTAL) 25 MG tablet Take 50 mg by mouth every morning.     . ondansetron (ZOFRAN) 4 MG tablet Take 1 tablet (4 mg total) by mouth every 6 (six) hours as needed for nausea or vomiting. 12 tablet 0  . pantoprazole (PROTONIX) 20 MG tablet Take 1  tablet (20 mg total) by mouth 2 (two) times daily before a meal. 30 tablet 0  . temazepam (RESTORIL) 7.5 MG capsule Take 7.5 mg by mouth at bedtime.     . traMADol (ULTRAM) 50 MG tablet Take 50 mg by mouth every morning.     . warfarin (COUMADIN) 5 MG tablet Take 5-7.5 mg by mouth See admin instructions. Take '5MG'$  on tues, thur, sat and sun, then take 7.'5mg'$  on all other days     No current facility-administered medications for this visit.    Allergies  Allergen Reactions  . Amitriptyline     Hallucinations  .  Sulfa Antibiotics      Review of Systems: Positive for anxiety, back pain, depression, fatigue, headaches, itching, menstrual pain, muscle pain and cramps, sleeping problems and skin rash. All other systems reviewed and negative except where noted in HPI.   Serum creatinine: 0.6 mg/dL 05/15/18 1941 Estimated creatinine clearance: 103.6 mL/min   Physical Exam:    Wt Readings from Last 3 Encounters:  05/31/18 152 lb (68.9 kg)  05/19/18 157 lb 6.4 oz (71.4 kg)  05/01/18 146 lb (66.2 kg)    BP 110/68   Pulse 76   Ht '5\' 3"'$  (1.6 m)   Wt 152 lb (68.9 kg)   BMI 26.93 kg/m  Constitutional:  Pleasant female in no acute distress. Psychiatric: Normal mood and affect. Behavior is normal. EENT: Pupils normal.  Conjunctivae are normal. No scleral icterus. Neck supple.  Cardiovascular: Normal rate, regular rhythm. No edema Pulmonary/chest: Effort normal and breath sounds normal. No wheezing, rales or rhonchi. Abdominal: Soft, nondistended, nontender. Bowel sounds active throughout. There are no masses palpable. No hepatomegaly. Neurological: Alert and oriented to person place and time. Skin: Skin is warm and dry. No rashes noted.  Tye Savoy, NP  05/31/2018, 11:23 AM  Cc: Janora Norlander, DO

## 2018-05-31 NOTE — Progress Notes (Signed)
Office Visit Note   Patient: Samantha Dougherty           Date of Birth: 02-Jan-1996           MRN: [id number] Visit Date: 05/31/2018 Requested by: Raliegh Ip, DO [address] [place], [place] [address] PCP: Raliegh Ip, DO  Subjective: Chief Complaint  Patient presents with  . Left Knee - Pain  . Right Knee - Pain    HPI: Patient presents with bilateral knee pain left worse than right.  She just moved here from [place].  She is on Coumadin for blood coagulation problem.  She reports left knee patellar instability along with right knee pain.  She is had an MRI scan done in [place].  This was ordered by Dr. Urbano Heir.  She has had physical therapy.  She has had an MRI scan.  She states that her knee gives out.  Reports peripatellar pain and occasional swelling.  She does have a knee brace which she wears.              ROS: All systems reviewed are negative as they relate to the chief complaint within the history of present illness.  Patient denies  fevers or chills.   Assessment & Plan: Visit Diagnoses:  1. Chronic pain of both knees   2. Patellar instability of left knee     Plan: Impression is left knee patellar instability with some ligamentous laxity present.  No effusion today but she does have apprehension.  Plan is to review that MRI scan and then decide on operative treatment.  This would be a complicated endeavor due to her Coumadin use.  She would be at risk for complication.  I will see her back in a couple of weeks and we can discuss operative treatment.  Follow-Up Instructions: Return in about 3 weeks (around 06/21/2018).   Orders:  Orders Placed This Encounter  Procedures  . XR Knee 1-2 Views Left  . XR Knee 1-2 Views Right   No orders of the defined types were placed in this encounter.     Procedures: No procedures performed   Clinical Data: No additional findings.  Objective: Vital Signs: There were no vitals taken for this  visit.  Physical Exam:   Constitutional: Patient appears well-developed HEENT:  Head: Normocephalic Eyes:EOM are normal Neck: Normal range of motion Cardiovascular: Normal rate Pulmonary/chest: Effort normal Neurologic: Patient is alert Skin: Skin is warm Psychiatric: Patient has normal mood and affect    Ortho Exam: Ortho exam demonstrates full range of motion of both knees with no increased Q angle.  She does have a bit of a J sign with quad contraction on the left-hand side.  Motor or sensory function of both feet intact.  No calf tenderness present.  Pedal pulses palpable.  With the knee flexed about 20 degrees I can subluxate her patella almost over the lateral condyle.  This does create apprehension.  Right knee also has laxity but not quite as much.  Collateral and cruciate ligaments are stable on the left.  No other masses lymph adenopathy or skin changes noted.  Specialty Comments:  No specialty comments available.  Imaging: Xr Knee 1-2 Views Left  Result Date: 05/31/2018 AP lateral left knee reviewed.  No fracture dislocation.  No arthritis.  Alignment normal.  Normal left knee  Xr Knee 1-2 Views Right  Result Date: 05/31/2018 AP lateral right knee reviewed.  No fracture no arthritis.  Joint spaces  maintained.  Normal right knee    PMFS History: Patient Active Problem List   Diagnosis Date Noted  . Bipolar disorder (HCC) 04/14/2018  . 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  . Seizure-like activity (HCC) 04/14/2018  . Migraine without status migrainosus, not intractable 04/14/2018  . Chronic low back pain 04/12/2018  . History of DVT of lower extremity 04/12/2018  . 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 POTS (postural orthostatic tachycardia syndrome) 04/12/2018  . Conversion disorder 04/12/2018  . IBS (irritable bowel syndrome) 04/12/2018  . 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 Dextroscoliosis 04/12/2018   Past Medical History:  Diagnosis  Date  . Anxiety   . Asthma   . Bipolar 2 disorder (HCC)   . Conversion disorder   . Depression   . DVT (deep venous thrombosis) (HCC)    LLE  . Fibromyalgia   . Gallbladder polyp   . H. pylori infection   . IBS (irritable bowel syndrome)   . Legal blindness    right eye  . Migraine   . Mood disorder (HCC)   . POTS (postural orthostatic tachycardia syndrome)   . Psychogenic nonepileptic seizure   . Urinary tract infection    off and on x years    Family History  Problem Relation Age of Onset  . Anxiety disorder Mother   . Depression Mother   . Hyperlipidemia Mother   . Thyroid disease Maternal Grandmother   . Deep vein thrombosis Maternal Grandmother   . Thyroid disease Maternal Grandfather   . Anxiety disorder Father   . Depression Father   . Autism Sister   . Other Sister        [place] syndrome  . Factor V Leiden deficiency Maternal Aunt   . Breast cancer Maternal Aunt   . Diabetes Paternal Grandfather   . Deep vein thrombosis Maternal Aunt   . Colon cancer Neg Hx   . Esophageal cancer Neg Hx   . Rectal cancer Neg Hx     Past Surgical History:  Procedure Laterality Date  . COLONOSCOPY WITH ESOPHAGOGASTRODUODENOSCOPY (EGD)    . HYMENECTOMY  2016   Social History   Occupational History  . Occupation: Consulting civil engineer  Tobacco Use  . Smoking status: Never Smoker  . Smokeless tobacco: Never Used  Substance and Sexual Activity  . Alcohol use: Never    Frequency: Never  . Drug use: Never  . Sexual activity: Not Currently

## 2018-05-31 NOTE — Telephone Encounter (Signed)
Dr August Saucer requesting copy of MRI report of scan that patient previously had done at [facility] in [facility]. I tried calling to get report while patient was here but received greeting that office was closed. Will try to reach out to them tomorrow to see if we can get report. Dr August Saucer will call patient once we have scan.  [phone]

## 2018-05-31 NOTE — Patient Instructions (Signed)
If you are age 21 or older, your body mass index should be between 23-30. Your Body mass index is 26.93 kg/m. If this is out of the aforementioned range listed, please consider follow up with your Primary Care Provider.  If you are age 4 or younger, your body mass index should be between 19-25. Your Body mass index is 26.93 kg/m. If this is out of the aformentioned range listed, please consider follow up with your Primary Care Provider.   Your provider has requested that you go to the basement level for lab work before leaving today. Press "B" on the elevator. The lab is located at the first door on the left as you exit the elevator.  We will contact you regarding a follow up appointment once we receive records.  Thank you for choosing me and [facility].   Willette Cluster, NP

## 2018-06-01 LAB — TISSUE TRANSGLUTAMINASE, IGA: (TTG) AB, IGA: 1 U/mL

## 2018-06-01 NOTE — Telephone Encounter (Signed)
IC office and s/w Alyssa and she stated she would fax report

## 2018-06-02 ENCOUNTER — Ambulatory Visit: Payer: 59 | Admitting: Family Medicine

## 2018-06-02 ENCOUNTER — Encounter: Payer: 59 | Admitting: Obstetrics and Gynecology

## 2018-06-02 NOTE — Telephone Encounter (Signed)
Report received as requested. Laid it on your computer. Please review and advise. Thanks.

## 2018-06-05 ENCOUNTER — Ambulatory Visit (INDEPENDENT_AMBULATORY_CARE_PROVIDER_SITE_OTHER): Payer: 59 | Admitting: Psychiatry

## 2018-06-05 ENCOUNTER — Ambulatory Visit (HOSPITAL_COMMUNITY)
Admission: RE | Admit: 2018-06-05 | Discharge: 2018-06-05 | Disposition: A | Discharge status: Home / Self Care | Payer: 59 | Source: Ambulatory Visit | Attending: Internal Medicine | Admitting: Internal Medicine

## 2018-06-05 ENCOUNTER — Encounter (HOSPITAL_COMMUNITY): Payer: Self-pay | Admitting: Psychiatry

## 2018-06-05 VITALS — BP 122/70 | HR 75 | Ht 63.0 in | Wt 152.0 lb

## 2018-06-05 DIAGNOSIS — F4310 Post-traumatic stress disorder, unspecified: Secondary | ICD-10-CM | POA: Diagnosis not present

## 2018-06-05 DIAGNOSIS — Z810 Family history of intellectual disabilities: Secondary | ICD-10-CM

## 2018-06-05 DIAGNOSIS — Z658 Other specified problems related to psychosocial circumstances: Secondary | ICD-10-CM

## 2018-06-05 DIAGNOSIS — R0602 Shortness of breath: Secondary | ICD-10-CM | POA: Diagnosis present

## 2018-06-05 DIAGNOSIS — Z818 Family history of other mental and behavioral disorders: Secondary | ICD-10-CM

## 2018-06-05 DIAGNOSIS — F331 Major depressive disorder, recurrent, moderate: Secondary | ICD-10-CM | POA: Diagnosis not present

## 2018-06-05 DIAGNOSIS — R634 Abnormal weight loss: Secondary | ICD-10-CM | POA: Diagnosis present

## 2018-06-05 DIAGNOSIS — G4700 Insomnia, unspecified: Secondary | ICD-10-CM

## 2018-06-05 MED ORDER — LAMOTRIGINE 25 MG PO TABS: 50.0000 mg | ORAL_TABLET | Freq: Every morning | ORAL | 0 refills | Status: DC

## 2018-06-05 MED ORDER — DULOXETINE HCL 30 MG PO CPEP: ORAL_CAPSULE | ORAL | 0 refills | Status: DC

## 2018-06-05 MED ORDER — ALPRAZOLAM 0.5 MG PO TABS: 0.5000 mg | ORAL_TABLET | Freq: Three times a day (TID) | ORAL | 0 refills | Status: DC | PRN

## 2018-06-05 MED ORDER — IOPAMIDOL (ISOVUE-300) INJECTION 61%
100.0000 mL | Freq: Once | INTRAVENOUS | Status: AC | PRN
  Administered 2018-06-05: 100 mL via INTRAVENOUS

## 2018-06-05 MED ORDER — BREXPIPRAZOLE 0.5 MG PO TABS: 0.5000 mg | ORAL_TABLET | Freq: Every evening | ORAL | 0 refills | Status: DC

## 2018-06-05 NOTE — Patient Instructions (Signed)
1. Decrease lexapro 10 mg daily for one week, then discontinue 2. Start duloxetine 30 mg daily for one week, then 60 mg daily  3. Continue lamotrigine 50 mg daily  4. Continue Rexulti 0.5 mg daily  5. Continue Xanax 0.5 mg three times a day for anxiety 5. Return to clinic in one month for 30 mins 6. Referral to IOP 7. Referral to therapy

## 2018-06-05 NOTE — Telephone Encounter (Signed)
I reviewed the scan.  Fairly unremarkable.  Her exam is the most remarkable finding.  She has significant patellar instability.  If her knee is giving out all the time that her options are rehab and bracing or surgical stabilization.  That would be a complicated endeavor based on her history of blood clots.  If that the way she wants to go we will set it up.  If he can just call her and find out that would be great thanks

## 2018-06-06 ENCOUNTER — Ambulatory Visit (HOSPITAL_COMMUNITY): Payer: 59 | Admitting: Internal Medicine

## 2018-06-06 ENCOUNTER — Telehealth (HOSPITAL_COMMUNITY): Payer: Self-pay | Admitting: Psychiatry

## 2018-06-06 ENCOUNTER — Telehealth (HOSPITAL_COMMUNITY): Payer: Self-pay | Admitting: Professional

## 2018-06-06 NOTE — Telephone Encounter (Signed)
1 incision on the medial aspect of the knee to take the hamstring tendon.  Second incision right over the medial epicondyle to anchor the tendon third incision over the medial aspect of the patella to past the reconstructed ligament into position.  The tricky thing with her is that she will have to go onto a Lovenox bridge coming off the Coumadin and she will be at risk for DVT.  She will be at risk for hematoma and knee stiffness and premature development of arthritis.  Beyond that if she really wants to get a visual picture of what going on she will need to come in for an appointment.

## 2018-06-06 NOTE — Telephone Encounter (Signed)
appt scheduled

## 2018-06-06 NOTE — Telephone Encounter (Signed)
IC they would like to proceed with surgery but wanted to know how involved surgery would be. What would be done etc?  I tried to schedule appt to discuss questions/concerns but they were wanting to know details before they schedule appt.

## 2018-06-07 ENCOUNTER — Telehealth (HOSPITAL_COMMUNITY): Payer: Self-pay | Admitting: Psychiatry

## 2018-06-07 NOTE — Telephone Encounter (Signed)
D:  Dr. Vanetta ShawlHisada referred pt to [facility]-IOP.  A:  Called to orient pt; pt states she didn't want to be mixed in with pts who are suicidal or with self injurious behaviors.  Informed pt that couldn't be guaranteed.  Discussed individual counseling optiion with her.  Pt states she would discuss with her mother.  Informed Dr. Vanetta ShawlHisada.

## 2018-06-09 ENCOUNTER — Telehealth (HOSPITAL_COMMUNITY): Payer: Self-pay

## 2018-06-09 NOTE — Telephone Encounter (Signed)
Patient called wanting to know CT results. Called patient back and had to leave a message that our physicians don't usually give results over the phone. They like to talk to the patient and give results face to face. Reminded patient she has follow up on 9/24 and MD will go over everything then Call cancer center if any further questions.

## 2018-06-13 ENCOUNTER — Other Ambulatory Visit: Payer: Self-pay

## 2018-06-13 ENCOUNTER — Encounter (HOSPITAL_COMMUNITY): Payer: Self-pay | Admitting: Internal Medicine

## 2018-06-13 ENCOUNTER — Inpatient Hospital Stay (HOSPITAL_COMMUNITY): Payer: 59

## 2018-06-13 ENCOUNTER — Telehealth: Payer: Self-pay | Admitting: Nurse Practitioner

## 2018-06-13 ENCOUNTER — Inpatient Hospital Stay (HOSPITAL_COMMUNITY): Payer: 59 | Attending: Internal Medicine | Admitting: Internal Medicine

## 2018-06-13 VITALS — BP 96/36 | HR 62 | Temp 97.9°F | Resp 16 | Wt 153.3 lb

## 2018-06-13 DIAGNOSIS — Z86718 Personal history of other venous thrombosis and embolism: Secondary | ICD-10-CM | POA: Insufficient documentation

## 2018-06-13 DIAGNOSIS — I82492 Acute embolism and thrombosis of other specified deep vein of left lower extremity: Secondary | ICD-10-CM

## 2018-06-13 LAB — PROTIME-INR
INR: 1.88
Prothrombin Time: 21.4 seconds — ABNORMAL HIGH (ref 11.4–15.2)

## 2018-06-13 NOTE — Telephone Encounter (Signed)
Patient and mother calling nurse back about lab results from 9.11.19.

## 2018-06-13 NOTE — Patient Instructions (Signed)
[facility] at [facility] Discharge Instructions  You Dr. Melton AlarHiggs today. Please have labs drawn before you leave today. We will [facility] to [facility]. We will set you up with the Coumadin clinic here.    Thank you for choosing [facility] at [facility] to provide your oncology and hematology care.  To afford each patient quality time with our provider, please arrive at least 15 minutes before your scheduled appointment time.   If you have a lab appointment with the Cancer Center please come in thru the  Main Entrance and check in at the main information desk  You need to re-schedule your appointment should you arrive 10 or more minutes late.  We strive to give you quality time with our providers, and arriving late affects you and other patients whose appointments are after yours.  Also, if you no show three or more times for appointments you may be dismissed from the clinic at the providers discretion.     Again, thank you for choosing [facility].  Our hope is that these requests will decrease the amount of time that you wait before being seen by our physicians.       _____________________________________________________________  Should you have questions after your visit to [facility], please contact our office at [phone] between the hours of 8:00 a.m. and 4:30 p.m.  Voicemails left after 4:00 p.m. will not be returned until the following business day.  For prescription refill requests, have your pharmacy contact our office and allow 72 hours.    Cancer Center Support Programs:   > Cancer Support Group  2nd Tuesday of the month 1pm-2pm, Journey Room

## 2018-06-13 NOTE — Progress Notes (Signed)
Diagnosis Deep vein thrombosis (DVT) of other vein of left lower extremity, unspecified chronicity (HCC) - Plan: Protime-INR, CBC with Differential/Platelet, Comprehensive metabolic panel, Lactate dehydrogenase, Protime-INR  Staging Cancer Staging No matching staging information was found for the patient.  Assessment and Plan:   1.  Reported history of lupus anticoagulant with left lower extremity DVT.  This reportedly was diagnosed in 2017 or 18.  Records unavailable for review at initial evaluation.  We have obtained some records from Dr. Rosine Beat office in [place].  Review of records indicate + lupus anticoagulant would have been affected by Xarelto.  Hypercoagulable labs were indicated that they were ordered, but no results are included in outside records.    Left LE doppler done 05/01/2018 was negative for DVT.    Labs done 05/31/2018 reviewed and showed no evidence of lupus anticoagulant, normal Factor V leiden and Prothrombin gene mutation, normal beta 2 glycoprotein antibodies.    CT CAP done 06/05/2018 reviewed and showed IMPRESSION: No acute findings in the chest, abdomen or pelvis.  Very small density posteriorly in the gallbladder could reflect small stone or polyp. No CT evidence for cholecystitis.    Pt has been on coumadin since 2017 due to reported history of + lupus anticoagulant.  She had history of DVT reportedly in 2017 but has not had recurrent episodes.  I have discussed with them due to negative work-up and no evidence of thrombosis, the question of her continuing anticoagulation remains.  I discussed with them that usually recommendations for ongoing anticoagulation would need to be warranted as she is 34 yoa and potential risks with anticoagulation  for long term ongoing therapy.  Pt will be referred to [facility] for evaluation based on prior  history and to determine if ongoing anticoagulation would be recommended.  INR today 1.88.  Continue current coumadin  dose for now and pt is referred to coumadin clinic for monitoring until [facility] evaluation has been reviewed.    2.  Abdominal pain/SOB.  Pt has CT CAP done 06/05/2018 that was reviewed and showed  IMPRESSION: No acute findings in the chest, abdomen or pelvis.  Very small density posteriorly in the gallbladder could reflect small stone or polyp. No CT evidence for cholecystitis.    She has been referred to GYN and GI for evaluation.    3.  Elevated LFTs.  She had concerns regarding minimal elevation in LFTs on labs done 03/2018 with AST 42 and ALT of 35 which is likely not clinically significant.  CT of abdomen and pelvis for further evaluation was done on 06/05/2018 that showed no liver abnormalities and no evidence of cholecystitis.  Pt has been referred to GI for evaluation.    4.  Seizure disorder/migraines.  She should follow-up with PCP or neurology for evaluation.  5.  IBS.  Patient reportedly has undergone EGD and colonoscopy in the past.  Follow-up with PCP and GI as recommended.   6.  Conversion disorder/mood disorder.  Follow-up with PCP or psychiatry as recommended.  7.  Family history of breast cancer and factor V Leiden.   Factor V Leiden negative on labs done 05/31/2018.    8.  Bruises.  Minor bruise on ear.  ANA negative.  Likely due to coumadin therapy.    Greater than 30 minutes spent with more than 50% spent in counseling and coordination of care.    Interval History:  Historical data obtained from note dated 05/18/2018.  22 year old female who has moved here from [place].  She reportedly was diagnosed with a left lower extremity DVT in 2017 or 2018.  She was followed by Dr. Oretha Ellis of hematology in [place].  No records are available for review but report from [facility] rocking him shows labs done 08/20/2016 showed a PT of 70 with a lupus anticoagulant of 53 and a dilute Russell viper venom time of 78.2.  Interpretation of results was reported as consistent with lupus  anticoagulant.  According to the patient testing was not done while she was on anticoagulation.  She was reportedly transitioned from Xarelto to Coumadin.  She is also on progesterone only birth control pills and reports she was followed by OB GYN in .  She has been on with factor V Leiden deficiency and that on also was diagnosed with breast cancer at the age of 53.  She had recent blood work done 04/14/2018 that showed a white count of 5.9 hemoglobin 14.5 platelets 242,000 chemistries were within normal limits with a creatinine of 0.59 liver function tests were normal at 42 AST ALT was 35.    Current Status:  Pt is seen today for follow-up.  She is here to go over doppler, scans and labs.  She reports abdominal pain. Mother has concerns about bruises on ear and other symptoms.    Problem List Patient Active Problem List   Diagnosis Date Noted  . MDD (major depressive disorder), recurrent episode, moderate (HCC) [F33.1] 06/05/2018  . Bipolar disorder (HCC) [F31.9] 04/14/2018  . 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 Seizure-like activity (HCC) [R56.9] 04/14/2018  . Migraine without status migrainosus, not intractable [G43.909] 04/14/2018  . Chronic low back pain [M54.5, G89.29] 04/12/2018  . History of DVT of lower extremity [Z86.718] 04/12/2018  . Legal blindness [H54.8] 04/12/2018  . POTS (postural orthostatic tachycardia syndrome) [R00.0, I95.1] 04/12/2018  . Conversion disorder [F44.9] 04/12/2018  . IBS (irritable bowel syndrome) [K58.9] 04/12/2018  . 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 Dextroscoliosis [M41.80] 04/12/2018    Past Medical History Past Medical History:  Diagnosis Date  . Anxiety   . Asthma   . Bipolar 2 disorder (HCC)   . Conversion disorder   . Depression   . DVT (deep venous thrombosis) (HCC)    LLE  . Fibromyalgia   . Gallbladder polyp   . H. pylori infection   . IBS (irritable bowel syndrome)   .  Legal blindness    right eye  . Migraine   . Mood disorder (HCC)   . POTS (postural orthostatic tachycardia syndrome)   . Psychogenic nonepileptic seizure   . Urinary tract infection    off and on x years    Past Surgical History Past Surgical History:  Procedure Laterality Date  . COLONOSCOPY WITH ESOPHAGOGASTRODUODENOSCOPY (EGD)    . HYMENECTOMY  2016    Family History Family History  Problem Relation Age of Onset  . Anxiety disorder Mother   . Depression Mother   . Hyperlipidemia Mother   . Bipolar disorder Mother   . Thyroid disease Maternal Grandmother   . Deep vein thrombosis Maternal Grandmother   . Thyroid disease Maternal Grandfather   . Anxiety disorder Father   . Depression Father   . Autism Sister   . Other Sister        [place] syndrome  . Factor V Leiden deficiency Maternal Aunt   . Breast cancer Maternal Aunt   . Bipolar disorder Maternal Aunt   . Diabetes Paternal Grandfather   . Deep vein  thrombosis Maternal Aunt   . Bipolar disorder Maternal Aunt   . Colon cancer Neg Hx   . Esophageal cancer Neg Hx   . Rectal cancer Neg Hx      Social History  reports that she has never smoked. She has never used smokeless tobacco. She reports that she does not drink alcohol or use drugs.  Medications  Current Outpatient Medications:  .  ALPRAZolam (XANAX) 0.5 MG tablet, Take 1 tablet (0.5 mg total) by mouth 3 (three) times daily as needed for anxiety., Disp: 90 tablet, Rfl: 0 .  Brexpiprazole (REXULTI) 0.5 MG TABS, Take 1 tablet (0.5 mg total) by mouth every evening., Disp: 30 tablet, Rfl: 0 .  hydroxypropyl methylcellulose / hypromellose (ISOPTO TEARS / GONIOVISC) 2.5 % ophthalmic solution, Place 1 drop into both eyes 3 (three) times daily as needed for dry eyes., Disp: , Rfl:  .  hyoscyamine (LEVSIN, ANASPAZ) 0.125 MG tablet, Take 0.125 mg by mouth every 6 (six) hours as needed for bladder spasms or cramping. , Disp: , Rfl:  .  lamoTRIgine (LAMICTAL) 25 MG  tablet, Take 2 tablets (50 mg total) by mouth every morning. (Patient taking differently: Take 50 mg by mouth daily. ), Disp: 60 tablet, Rfl: 0 .  ondansetron (ZOFRAN) 4 MG tablet, Take 1 tablet (4 mg total) by mouth every 6 (six) hours as needed for nausea or vomiting., Disp: 12 tablet, Rfl: 0 .  warfarin (COUMADIN) 5 MG tablet, Take 5-7.5 mg by mouth See admin instructions. Take 5MG  on tues, thur, sat and sun, then take 7.5mg  on all other days, Disp: , Rfl:  .  pantoprazole (PROTONIX) 20 MG tablet, Take 1 tablet (20 mg total) by mouth 2 (two) times daily before a meal. (Patient not taking: Reported on 06/13/2018), Disp: 30 tablet, Rfl: 0  Allergies Amitriptyline and Sulfa antibiotics  Review of Systems Review of Systems - Oncology ROS abdominal pain, bruising.     Physical Exam  Vitals Wt Readings from Last 3 Encounters:  06/13/18 153 lb 4.8 oz (69.5 kg)  05/31/18 152 lb (68.9 kg)  05/19/18 157 lb 6.4 oz (71.4 kg)   Temp Readings from Last 3 Encounters:  06/13/18 97.9 F (36.6 C) (Oral)  05/19/18 99.2 F (37.3 C) (Oral)  05/15/18 98 F (36.7 C) (Oral)   BP Readings from Last 3 Encounters:  06/13/18 (!) 96/36  05/31/18 110/68  05/19/18 (!) 111/53   Pulse Readings from Last 3 Encounters:  06/13/18 62  05/31/18 76  05/19/18 75   Constitutional: Well-developed, well-nourished, and in no distress.   HENT: Head: Normocephalic and atraumatic.  Mouth/Throat: No oropharyngeal exudate. Mucosa moist. Eyes: Pupils are equal, round, and reactive to light. Conjunctivae are normal. No scleral icterus.  Neck: Normal range of motion. Neck supple. No JVD present.  Cardiovascular: Normal rate, regular rhythm and normal heart sounds.  Exam reveals no gallop and no friction rub.   No murmur heard. Pulmonary/Chest: Effort normal and breath sounds normal. No respiratory distress. No wheezes.No rales.  Abdominal: Soft. Bowel sounds are normal. No distension. Tender to palpation diffusely.  No  guarding.   Musculoskeletal: No edema or tenderness.  Lymphadenopathy: No cervical, axillary or supraclavicular adenopathy.  Neurological: Alert and oriented to person, place, and time. No cranial nerve deficit.  Skin: Skin is warm and dry. No rash noted. No erythema. No pallor. Minor bruise on right ear.   Psychiatric: Affect and judgment normal.   Labs Appointment on 06/13/2018  Component Date Value Ref  Range Status  . Prothrombin Time 06/13/2018 21.4* 11.4 - 15.2 seconds Final  . INR 06/13/2018 1.88   Final   Performed at [facility], [address] [place], [place] [address]     Pathology Orders Placed This Encounter  Procedures  . Protime-INR    Standing Status:   Future    Number of Occurrences:   1    Standing Expiration Date:   06/14/2019  . CBC with Differential/Platelet    Standing Status:   Future    Standing Expiration Date:   06/14/2019  . Comprehensive metabolic panel    Standing Status:   Future    Standing Expiration Date:   06/14/2019  . Lactate dehydrogenase    Standing Status:   Future    Standing Expiration Date:   06/14/2019  . Protime-INR    Standing Status:   Future    Standing Expiration Date:   06/14/2019       Ahmed PrimaVetta Kevionna Heffler MD

## 2018-06-14 ENCOUNTER — Telehealth (HOSPITAL_COMMUNITY): Payer: Self-pay

## 2018-06-14 NOTE — Telephone Encounter (Signed)
Per Dr. Melton AlarHiggs request, I left patient a message explaining her INR results and that she needs to continue her coumadin at the current dose and she is being referred to the coumadin clinic for coumadin monitoring until she gets an app[facility] at [facility]. Call cancer center if any questions.

## 2018-06-14 NOTE — Telephone Encounter (Signed)
See lab results message

## 2018-06-20 ENCOUNTER — Encounter: Payer: Self-pay | Admitting: Cardiology

## 2018-06-20 NOTE — Progress Notes (Deleted)
Cardiology Office Note  Date: 06/20/2018   ID: Samantha Dougherty, DOB [birth year], MRN [id number]  PCP: Raliegh Ip, DO  Consulting Cardiologist: Nona Dell, MD   No chief complaint on file.   History of Present Illness: Samantha Dougherty is a 22 y.o. female referred by Dr. Melton Alar for the purpose of establishing in our anticoagulation clinic.  I reviewed recent office note from September 24 indicating a reported history of positive lupus anticoagulant and previous left lower extremity DVT in 2017, although with subsequent hypercoagulability work-up being negative.  She has been referred to [facility] for further evaluation, but plans to continue on Coumadin for now.  Past Medical History:  Diagnosis Date  . Anxiety   . Asthma   . Bipolar 2 disorder (HCC)   . Conversion disorder   . Depression   . DVT (deep venous thrombosis) (HCC)    LLE  . Fibromyalgia   . Gallbladder polyp   . H. pylori infection   . IBS (irritable bowel syndrome)   . Legal blindness    Right eye  . Migraine   . Mood disorder (HCC)   . Psychogenic nonepileptic seizure   . Urinary tract infection     Past Surgical History:  Procedure Laterality Date  . COLONOSCOPY WITH ESOPHAGOGASTRODUODENOSCOPY (EGD)    . HYMENECTOMY  2016    Current Outpatient Medications  Medication Sig Dispense Refill  . ALPRAZolam (XANAX) 0.5 MG tablet Take 1 tablet (0.5 mg total) by mouth 3 (three) times daily as needed for anxiety. 90 tablet 0  . Brexpiprazole (REXULTI) 0.5 MG TABS Take 1 tablet (0.5 mg total) by mouth every evening. 30 tablet 0  . hydroxypropyl methylcellulose / hypromellose (ISOPTO TEARS / GONIOVISC) 2.5 % ophthalmic solution Place 1 drop into both eyes 3 (three) times daily as needed for dry eyes.    . hyoscyamine (LEVSIN, ANASPAZ) 0.125 MG tablet Take 0.125 mg by mouth every 6 (six) hours as needed for bladder spasms or cramping.     . lamoTRIgine (LAMICTAL) 25 MG tablet Take 2 tablets (50 mg total) by mouth  every morning. (Patient taking differently: Take 50 mg by mouth daily. ) 60 tablet 0  . ondansetron (ZOFRAN) 4 MG tablet Take 1 tablet (4 mg total) by mouth every 6 (six) hours as needed for nausea or vomiting. 12 tablet 0  . pantoprazole (PROTONIX) 20 MG tablet Take 1 tablet (20 mg total) by mouth 2 (two) times daily before a meal. (Patient not taking: Reported on 06/13/2018) 30 tablet 0  . warfarin (COUMADIN) 5 MG tablet Take 5-7.5 mg by mouth See admin instructions. Take 5MG  on tues, thur, sat and sun, then take 7.5mg  on all other days     No current facility-administered medications for this visit.    Allergies:  Amitriptyline and Sulfa antibiotics   Social History: The patient  reports that she has never smoked. She has never used smokeless tobacco. She reports that she does not drink alcohol or use drugs.   Family History: The patient's family history includes Anxiety disorder in her father and mother; Autism in her sister; Bipolar disorder in her maternal aunt, maternal aunt, and mother; Breast cancer in her maternal aunt; Deep vein thrombosis in her maternal aunt and maternal grandmother; Depression in her father and mother; Diabetes in her paternal grandfather; Factor V Leiden deficiency in her maternal aunt; Hyperlipidemia in her mother; Other in her sister; Thyroid disease in her maternal grandfather and maternal grandmother.  ROS:  Please see the history of present illness. Otherwise, complete review of systems is positive for {NONE DEFAULTED:18576::"none"}.  All other systems are reviewed and negative.   Physical Exam: VS:  LMP 05/29/2018 , BMI There is no height or weight on file to calculate BMI.  Wt Readings from Last 3 Encounters:  06/13/18 153 lb 4.8 oz (69.5 kg)  05/31/18 152 lb (68.9 kg)  05/19/18 157 lb 6.4 oz (71.4 kg)    General: Patient appears comfortable at rest. HEENT: Conjunctiva and lids normal, oropharynx clear with moist mucosa. Neck: Supple, no elevated JVP or  carotid bruits, no thyromegaly. Lungs: Clear to auscultation, nonlabored breathing at rest. Cardiac: Regular rate and rhythm, no S3 or significant systolic murmur, no pericardial rub. Abdomen: Soft, nontender, no hepatomegaly, bowel sounds present, no guarding or rebound. Extremities: No pitting edema, distal pulses 2+. Skin: Warm and dry. Musculoskeletal: No kyphosis. Neuropsychiatric: Alert and oriented x3, affect grossly appropriate.  ECG: I personally reviewed the tracing from 05/15/2018 which showed sinus rhythm with nonspecific anterolateral T wave abnormalities.  Recent Labwork: 05/15/2018: BUN 10; Creatinine, Ser 0.60; Hemoglobin 14.3; Platelets 192; Potassium 3.5; Sodium 139 05/31/2018: ALT 14; AST 18     Component Value Date/Time   CHOL 171 04/14/2018 1210   TRIG 140 04/14/2018 1210   HDL 50 04/14/2018 1210   CHOLHDL 3.4 04/14/2018 1210   LDLCALC 93 04/14/2018 1210    Other Studies Reviewed Today:  Lower extremity venous Dopplers 05/01/2018: IMPRESSION: No evidence of left lower extremity deep vein thrombosis.  CT of abdomen, pelvis, chest 06/05/2018: IMPRESSION: No acute findings in the chest, abdomen or pelvis.  Very small density posteriorly in the gallbladder could reflect small stone or polyp. No CT evidence for cholecystitis.  Assessment and Plan:    Current medicines were reviewed with the patient today.  No orders of the defined types were placed in this encounter.   Disposition:  Signed, Jonelle Sidle, MD, [facility] 06/20/2018 8:52 AM    [facility] [address] [place], [place], [place] [address] Phone: [phone]; Fax: [phone]

## 2018-06-21 ENCOUNTER — Ambulatory Visit: Payer: 59 | Admitting: Cardiology

## 2018-06-23 ENCOUNTER — Encounter: Payer: Self-pay | Admitting: Cardiology

## 2018-06-25 ENCOUNTER — Other Ambulatory Visit: Payer: Self-pay

## 2018-06-25 ENCOUNTER — Emergency Department (HOSPITAL_COMMUNITY)
Admission: EM | Admit: 2018-06-25 | Discharge: 2018-06-26 | Disposition: A | Discharge status: Home / Self Care | Payer: 59 | Attending: Emergency Medicine | Admitting: Emergency Medicine

## 2018-06-25 DIAGNOSIS — R569 Unspecified convulsions: Secondary | ICD-10-CM | POA: Diagnosis not present

## 2018-06-25 DIAGNOSIS — Z7901 Long term (current) use of anticoagulants: Secondary | ICD-10-CM | POA: Diagnosis not present

## 2018-06-25 DIAGNOSIS — Z86718 Personal history of other venous thrombosis and embolism: Secondary | ICD-10-CM | POA: Insufficient documentation

## 2018-06-25 DIAGNOSIS — Z79899 Other long term (current) drug therapy: Secondary | ICD-10-CM | POA: Insufficient documentation

## 2018-06-25 DIAGNOSIS — J45909 Unspecified asthma, uncomplicated: Secondary | ICD-10-CM | POA: Insufficient documentation

## 2018-06-25 DIAGNOSIS — R112 Nausea with vomiting, unspecified: Secondary | ICD-10-CM

## 2018-06-25 DIAGNOSIS — D6862 Lupus anticoagulant syndrome: Secondary | ICD-10-CM | POA: Diagnosis not present

## 2018-06-25 NOTE — ED Triage Notes (Signed)
Pt mother called ems for seizures. Pt hx of same. Pt has 1 event with ems. Pt experiences blurry vision and dizziness before event. Clonic activity. Typically lasting 10-15 seconds.  Working on seizure work up with neurologist. No rescue drugs. Takes Lamictal.  Oct 1, 3, 5, today. More frequent that usual.usual is 1 event per 1.5 weeks.

## 2018-06-26 LAB — COMPREHENSIVE METABOLIC PANEL
ALK PHOS: 68 U/L (ref 38–126)
ALT: 18 U/L (ref 0–44)
AST: 27 U/L (ref 15–41)
Albumin: 4.2 g/dL (ref 3.5–5.0)
Anion gap: 8 (ref 5–15)
BILIRUBIN TOTAL: 0.5 mg/dL (ref 0.3–1.2)
BUN: 11 mg/dL (ref 6–20)
CALCIUM: 9.5 mg/dL (ref 8.9–10.3)
CO2: 26 mmol/L (ref 22–32)
CREATININE: 0.62 mg/dL (ref 0.44–1.00)
Chloride: 104 mmol/L (ref 98–111)
Glucose, Bld: 95 mg/dL (ref 70–99)
Potassium: 3.7 mmol/L (ref 3.5–5.1)
Sodium: 138 mmol/L (ref 135–145)
TOTAL PROTEIN: 7.5 g/dL (ref 6.5–8.1)

## 2018-06-26 LAB — CBC WITH DIFFERENTIAL/PLATELET
BASOS ABS: 0 10*3/uL (ref 0.0–0.1)
BASOS PCT: 0 %
EOS ABS: 0.1 10*3/uL (ref 0.0–0.7)
Eosinophils Relative: 1 %
HEMATOCRIT: 41.8 % (ref 36.0–46.0)
Hemoglobin: 14 g/dL (ref 12.0–15.0)
Lymphocytes Relative: 43 %
Lymphs Abs: 3 10*3/uL (ref 0.7–4.0)
MCH: 30.6 pg (ref 26.0–34.0)
MCHC: 33.5 g/dL (ref 30.0–36.0)
MCV: 91.3 fL (ref 78.0–100.0)
Monocytes Absolute: 0.4 10*3/uL (ref 0.1–1.0)
Monocytes Relative: 6 %
NEUTROS PCT: 50 %
Neutro Abs: 3.4 10*3/uL (ref 1.7–7.7)
PLATELETS: 257 10*3/uL (ref 150–400)
RBC: 4.58 MIL/uL (ref 3.87–5.11)
RDW: 12.2 % (ref 11.5–15.5)
WBC: 6.9 10*3/uL (ref 4.0–10.5)

## 2018-06-26 LAB — URINALYSIS, ROUTINE W REFLEX MICROSCOPIC
BILIRUBIN URINE: NEGATIVE
GLUCOSE, UA: NEGATIVE mg/dL
Hgb urine dipstick: NEGATIVE
KETONES UR: NEGATIVE mg/dL
Leukocytes, UA: NEGATIVE
Nitrite: NEGATIVE
PH: 7 (ref 5.0–8.0)
Protein, ur: NEGATIVE mg/dL
SPECIFIC GRAVITY, URINE: 1.005 (ref 1.005–1.030)

## 2018-06-26 LAB — PREGNANCY, URINE: Preg Test, Ur: NEGATIVE

## 2018-06-26 LAB — PROTIME-INR
INR: 2.5
PROTHROMBIN TIME: 26.8 s — AB (ref 11.4–15.2)

## 2018-06-26 LAB — LIPASE, BLOOD: LIPASE: 30 U/L (ref 11–51)

## 2018-06-26 MED ORDER — SODIUM CHLORIDE 0.9 % IV BOLUS
1000.0000 mL | Freq: Once | INTRAVENOUS | Status: AC
  Administered 2018-06-26: 1000 mL via INTRAVENOUS

## 2018-06-26 NOTE — ED Provider Notes (Signed)
[facility] EMERGENCY DEPARTMENT Provider Note   CSN: [id number] Arrival date & time: 06/25/18  2329  Time seen 23:45 PM    History   Chief Complaint Chief Complaint  Patient presents with  . Seizures    HPI Samantha Dougherty is a 22 y.o. female.  HPI patient states for the past 2 weeks she has been feeling weak and exhausted.  She states she is sleeping late into the afternoon.  She describes decreased appetite.  She states she has been having vomiting past 3 days about 2 episodes each day, and diarrhea alternating with regular bowel movements about 2 a day.  She states she has chronic daily abdominal pain.  She describes as diffuse and cramping and stabbing in nature.  She denies any fever.  She states she feels dizzy and lightheaded and feels like she might pass out.  She also complains of dysuria x2 days and frequency but denies hematuria.  Patient was recently evaluated by [facility] gastroenterology and has had a CT of her chest, abdomen/pelvis.  They state it only showed a possible polyp in her gallbladder.  There was no change in her medications although they have not had a follow-up appointment yet.  Patient also states she was seeing a neurologist year and a half ago.  She was diagnosed with nonepileptic seizures.  She has had a EEG, CT scan, MRI of her brain and were normal.  Mother states she notices the episodes happen more often and are more intense during the patient's menses.  She has had 4 episodes in the past 5 days and she is currently on her menses.  Mother states she seems more confused during her menses.  She states that the episodes start with yawning, licking of her lips, has extreme change in her emotions as to being very high and being very low rapidly.  She states the episodes chart start with a charley horse in her right lower leg and then it radiates up into her right thigh, up to her right arm, and then behind her neck.  When she gets spasms of pain in her neck her head  will go back and she has her eyes rolled back and then she will start staring and blinking.  Sometimes the pain will then go into her left arm and then down into her left thigh and then her left leg.  Mother states she is tired afterwards.  The neurologist did not start her on a medications however her psychiatrist started her on lobe ictal which mother states seems to be helping.  They have an appointment at [facility] neurology on October 15.  Please note mother very graphically demonstrates how her seizure occurs.  At some point the patient states "yes that is exactly how they look".  Patient is also chronically on Coumadin for lupus anticoagulant and history of DVT.  She states her last INR was 1.8.  She states that when her INR is in the therapeutic range, specifically 2.6 she feels "great".  However if her INR is not therapeutic she feels worse.  PCP Raliegh Ip, DO   Past Medical History:  Diagnosis Date  . Anxiety   . Asthma   . Bipolar 2 disorder (HCC)   . Conversion disorder   . Depression   . DVT (deep venous thrombosis) (HCC)    LLE  . Fibromyalgia   . Gallbladder polyp   . H. pylori infection   . IBS (irritable bowel syndrome)   . Legal  blindness    Right eye  . Migraine   . Mood disorder (HCC)   . Psychogenic nonepileptic seizure   . Urinary tract infection     Patient Active Problem List   Diagnosis Date Noted  . MDD (major depressive disorder), recurrent episode, moderate (HCC) 06/05/2018  . Bipolar disorder (HCC) 04/14/2018  . 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  . Seizure-like activity (HCC) 04/14/2018  . Migraine without status migrainosus, not intractable 04/14/2018  . Chronic low back pain 04/12/2018  . History of DVT of lower extremity 04/12/2018  . 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 POTS (postural orthostatic tachycardia syndrome) 04/12/2018  . Conversion disorder 04/12/2018  . IBS (irritable bowel syndrome) 04/12/2018  . 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 Dextroscoliosis 04/12/2018    Past Surgical History:  Procedure Laterality Date  . COLONOSCOPY WITH ESOPHAGOGASTRODUODENOSCOPY (EGD)    . HYMENECTOMY  2016     OB History   None      Home Medications    Prior to Admission medications   Medication Sig Start Date End Date Taking? Authorizing Provider  ALPRAZolam Prudy Feeler) 0.5 MG tablet Take 1 tablet (0.5 mg total) by mouth 3 (three) times daily as needed for anxiety. 06/05/18   Neysa Hotter, MD  Brexpiprazole (REXULTI) 0.5 MG TABS Take 1 tablet (0.5 mg total) by mouth every evening. 06/05/18   Neysa Hotter, MD  hydroxypropyl methylcellulose / hypromellose (ISOPTO TEARS / GONIOVISC) 2.5 % ophthalmic solution Place 1 drop into both eyes 3 (three) times daily as needed for dry eyes.    [provider]  hyoscyamine (LEVSIN, ANASPAZ) 0.125 MG tablet Take 0.125 mg by mouth every 6 (six) hours as needed for bladder spasms or cramping.     [provider]  lamoTRIgine (LAMICTAL) 25 MG tablet Take 2 tablets (50 mg total) by mouth every morning. Patient taking differently: Take 50 mg by mouth daily.  06/05/18   Neysa Hotter, MD  ondansetron (ZOFRAN) 4 MG tablet Take 1 tablet (4 mg total) by mouth every 6 (six) hours as needed for nausea or vomiting. 05/15/18   Raeford Razor, MD  pantoprazole (PROTONIX) 20 MG tablet Take 1 tablet (20 mg total) by mouth 2 (two) times daily before a meal. Patient not taking: Reported on 06/13/2018 05/15/18   Raeford Razor, MD  warfarin (COUMADIN) 5 MG tablet Take 5-7.5 mg by mouth See admin instructions. Take 5MG  on tues, thur, sat and sun, then take 7.5mg  on all other days    [provider]    Family History Family History  Problem Relation Age of Onset  . Anxiety disorder Mother   . Depression Mother   . Hyperlipidemia Mother   . Bipolar disorder Mother   . Thyroid disease Maternal Grandmother   . Deep vein thrombosis Maternal Grandmother    . Thyroid disease Maternal Grandfather   . Anxiety disorder Father   . Depression Father   . Autism Sister   . Other Sister        [place] syndrome  . Factor V Leiden deficiency Maternal Aunt   . Breast cancer Maternal Aunt   . Bipolar disorder Maternal Aunt   . Diabetes Paternal Grandfather   . Deep vein thrombosis Maternal Aunt   . Bipolar disorder Maternal Aunt   . Colon cancer Neg Hx   . Esophageal cancer Neg Hx   . Rectal cancer Neg Hx     Social History Social History   Tobacco Use  . Smoking status:  Never Smoker  . Smokeless tobacco: Never Used  Substance Use Topics  . Alcohol use: Never    Frequency: Never  . Drug use: Never  lives with mother   Allergies   Amitriptyline and Sulfa antibiotics   Review of Systems Review of Systems  All other systems reviewed and are negative.    Physical Exam Updated Vital Signs BP 111/78 (BP Location: Right Arm)   Pulse 64   Temp 98.6 F (37 C) (Oral)   Resp 18   Ht 5\' 3"  (1.6 m)   Wt 68.9 kg   LMP 05/29/2018   SpO2 98%   BMI 26.93 kg/m   Vital signs normal    Physical Exam  Constitutional: She is oriented to person, place, and time. She appears well-developed and well-nourished.  Non-toxic appearance. She does not appear ill. No distress.  Pt has a emesis basis with small amount of yellow thick fluid.  HENT:  Head: Normocephalic and atraumatic.  Right Ear: External ear normal.  Left Ear: External ear normal.  Nose: Nose normal. No mucosal edema or rhinorrhea.  Mouth/Throat: Oropharynx is clear and moist and mucous membranes are normal. No dental abscesses or uvula swelling.  Eyes: Pupils are equal, round, and reactive to light. Conjunctivae and EOM are normal.  Neck: Normal range of motion and full passive range of motion without pain. Neck supple.  Cardiovascular: Normal rate, regular rhythm and normal heart sounds. Exam reveals no gallop and no friction rub.  No murmur heard. Pulmonary/Chest: Effort  normal and breath sounds normal. No respiratory distress. She has no wheezes. She has no rhonchi. She has no rales. She exhibits no tenderness and no crepitus.  Abdominal: Soft. Normal appearance and bowel sounds are normal. She exhibits no distension. There is no tenderness. There is no rebound and no guarding.  Musculoskeletal: Normal range of motion. She exhibits no edema or tenderness.  Moves all extremities well.   Neurological: She is alert and oriented to person, place, and time. She has normal strength. No cranial nerve deficit.  Skin: Skin is warm, dry and intact. No rash noted. No erythema. No pallor.  Psychiatric: She has a normal mood and affect. Her speech is normal and behavior is normal. Her mood appears not anxious.  Nursing note and vitals reviewed.    ED Treatments / Results  Labs (all labs ordered are listed, but only abnormal results are displayed)  Results for orders placed or performed during the hospital encounter of 06/25/18  Comprehensive metabolic panel  Result Value Ref Range   Sodium 138 135 - 145 mmol/L   Potassium 3.7 3.5 - 5.1 mmol/L   Chloride 104 98 - 111 mmol/L   CO2 26 22 - 32 mmol/L   Glucose, Bld 95 70 - 99 mg/dL   BUN 11 6 - 20 mg/dL   Creatinine, Ser 1.61 0.44 - 1.00 mg/dL   Calcium 9.5 8.9 - 09.6 mg/dL   Total Protein 7.5 6.5 - 8.1 g/dL   Albumin 4.2 3.5 - 5.0 g/dL   AST 27 15 - 41 U/L   ALT 18 0 - 44 U/L   Alkaline Phosphatase 68 38 - 126 U/L   Total Bilirubin 0.5 0.3 - 1.2 mg/dL   GFR calc non Af Amer >60 >60 mL/min   GFR calc Af Amer >60 >60 mL/min   Anion gap 8 5 - 15  CBC with Differential  Result Value Ref Range   WBC 6.9 4.0 - 10.5 K/uL   RBC  4.58 3.87 - 5.11 MIL/uL   Hemoglobin 14.0 12.0 - 15.0 g/dL   HCT 40.9 81.1 - 91.4 %   MCV 91.3 78.0 - 100.0 fL   MCH 30.6 26.0 - 34.0 pg   MCHC 33.5 30.0 - 36.0 g/dL   RDW 78.2 95.6 - 21.3 %   Platelets 257 150 - 400 K/uL   Neutrophils Relative % 50 %   Neutro Abs 3.4 1.7 - 7.7 K/uL    Lymphocytes Relative 43 %   Lymphs Abs 3.0 0.7 - 4.0 K/uL   Monocytes Relative 6 %   Monocytes Absolute 0.4 0.1 - 1.0 K/uL   Eosinophils Relative 1 %   Eosinophils Absolute 0.1 0.0 - 0.7 K/uL   Basophils Relative 0 %   Basophils Absolute 0.0 0.0 - 0.1 K/uL  Lipase, blood  Result Value Ref Range   Lipase 30 11 - 51 U/L  Protime-INR  Result Value Ref Range   Prothrombin Time 26.8 (H) 11.4 - 15.2 seconds   INR 2.50   Urinalysis, Routine w reflex microscopic  Result Value Ref Range   Color, Urine YELLOW YELLOW   APPearance CLEAR CLEAR   Specific Gravity, Urine 1.005 1.005 - 1.030   pH 7.0 5.0 - 8.0   Glucose, UA NEGATIVE NEGATIVE mg/dL   Hgb urine dipstick NEGATIVE NEGATIVE   Bilirubin Urine NEGATIVE NEGATIVE   Ketones, ur NEGATIVE NEGATIVE mg/dL   Protein, ur NEGATIVE NEGATIVE mg/dL   Nitrite NEGATIVE NEGATIVE   Leukocytes, UA NEGATIVE NEGATIVE  Pregnancy, urine  Result Value Ref Range   Preg Test, Ur NEGATIVE NEGATIVE    Laboratory interpretation all normal except therapeutic INR     Results for orders placed or performed in visit on 06/13/18  Protime-INR  Result Value Ref Range   Prothrombin Time 21.4 (H) 11.4 - 15.2 seconds   INR 1.88        EKG None  Radiology No results found.   Ct Chest W Contrast  Result Date: 06/06/2018 CLINICAL DATA:  Shortness of breath, generalized abdominal pain, chronic  IMPRESSION: No acute findings in the chest, abdomen or pelvis. Very small density posteriorly in the gallbladder could reflect small stone or polyp. No CT evidence for cholecystitis. Electronically Signed   By: Charlett Nose M.D.   On: 06/06/2018 09:01   Ct Abdomen Pelvis W Contrast  Result Date: 06/06/2018 CLINICAL DATA:  Shortness of breath, generalized abdominal pain, chronic  IMPRESSION: No acute findings in the chest, abdomen or pelvis. Very small density posteriorly in the gallbladder could reflect small stone or polyp. No CT evidence for cholecystitis.  Electronically Signed   By: Charlett Nose M.D.   On: 06/06/2018 09:01   Procedures Procedures (including critical care time)  Medications Ordered in ED Medications  sodium chloride 0.9 % bolus 1,000 mL (1,000 mLs Intravenous New Bag/Given 06/26/18 0259)  sodium chloride 0.9 % bolus 1,000 mL (0 mLs Intravenous Stopped 06/26/18 0300)  sodium chloride 0.9 % bolus 1,000 mL (0 mLs Intravenous Stopped 06/26/18 0300)     Initial Impression / Assessment and Plan / ED Course  I have reviewed the triage vital signs and the nursing notes.  Pertinent labs & imaging results that were available during my care of the patient were reviewed by me and considered in my medical decision making (see chart for details).     Patient was given IV fluids.  She is reluctant to try any nausea medication.  Recheck at 2:30 AM patient states she has  not had urinary output yet, she has had 2 L of IV fluids.  We discussed her test results which were all normal including a urinalysis showing a normal specific gravity.  There is no sign of dehydration on her test.  She states she would like to have another liter of fluids so she does not have to drink.  Recheck at 3:30 AM patient has almost finished her third liter of IV fluids.  She feels ready to be discharged.  Final Clinical Impressions(s) / ED Diagnoses   Final diagnoses:  Non-intractable vomiting with nausea, unspecified vomiting type  Convulsions, unspecified convulsion type [facility])    ED Discharge Orders    None     Plan discharge  Devoria Albe, MD, Concha Pyo, MD 06/26/18 [phone]

## 2018-06-26 NOTE — Discharge Instructions (Addendum)
Drink plenty of fluids. Keep your appointment with [facility] Neurology.

## 2018-06-27 ENCOUNTER — Encounter (HOSPITAL_COMMUNITY): Payer: Self-pay | Admitting: Internal Medicine

## 2018-06-27 NOTE — Progress Notes (Signed)
Faxed patient records to [facility] for hematology referral [phone].  The practice will contact patient directly to schedule.

## 2018-06-28 ENCOUNTER — Telehealth: Payer: Self-pay

## 2018-06-28 ENCOUNTER — Ambulatory Visit (INDEPENDENT_AMBULATORY_CARE_PROVIDER_SITE_OTHER): Payer: 59 | Admitting: Orthopedic Surgery

## 2018-06-28 NOTE — Telephone Encounter (Signed)
Spoke with patient today regarding a follow up appointment with Dr. Adela Lank and medical records that we have NOT received.  I have requested records three times.  I requested that patient contact previous GI physicians about sending records. I told her I have made her an appt with Dr. Adela Lank for 11/14; however, we need the records in order to treat her accordingly.  Patient stated she would contact previous GI physicians regarding records.

## 2018-07-04 ENCOUNTER — Encounter: Payer: Self-pay | Admitting: Neurology

## 2018-07-04 ENCOUNTER — Ambulatory Visit: Payer: 59 | Admitting: Neurology

## 2018-07-04 VITALS — BP 106/71 | HR 80 | Ht 63.0 in | Wt 155.0 lb

## 2018-07-04 DIAGNOSIS — R569 Unspecified convulsions: Secondary | ICD-10-CM | POA: Diagnosis not present

## 2018-07-04 DIAGNOSIS — G43909 Migraine, unspecified, not intractable, without status migrainosus: Secondary | ICD-10-CM

## 2018-07-04 MED ORDER — LAMOTRIGINE 100 MG PO TABS: 100.0000 mg | ORAL_TABLET | Freq: Two times a day (BID) | ORAL | 11 refills | Status: DC

## 2018-07-04 NOTE — Progress Notes (Signed)
BH MD/PA/NP OP Progress Note  07/07/2018 10:41 AM Samantha Dougherty  MRN:  [id number]  Chief Complaint:  HPI:  - Patient was evaluated by neurology for seizure like episode. She is referred to [facility] for second opinion.   Patient presents late for follow-up appointment for depression.  She states that she has less GI symptoms after up titration of lamotrigine.  She could not continue duloxetine as she noticed more headache.  She misses her other family members in [place].  She noticed a lot of difference in lifestyle compared to the one in [place].  She hopes to do things more outside. She "hate" being inside the house. She believes that she will do more if she were not to have any myalgia.  Although she loves playing basketball, she feels exacerbation in pain and needs to stay in the bed afterwards.  She enjoys drawing.  She feels hopeful for the upcoming appointment at [facility] to have more prospective about seizure episodes.  She denies insomnia.  She denies anhedonia.  She has a period of depression, anxiety and significant fatigue. She has fair appetite. She has fair concentration. She denies SI. She denies panic attacks. Although she did physical therapy last year for 3 years, she is unsure if she did graded exercise.  I have utilized the [place] Controlled Substances Reporting System (PMP AWARxE) to confirm adherence regarding the patient's medication. My review reveals appropriate prescription fills.   Visit Diagnosis:    ICD-10-CM   1. MDD (major depressive disorder), recurrent episode, moderate (HCC) F33.1     Past Psychiatric History:  Please see initial evaluation for full details. I have reviewed the history. No updates at this time.     Past Medical History:  Past Medical History:  Diagnosis Date  . Anxiety   . Asthma   . Bipolar 2 disorder (HCC)   . Conversion disorder   . Depression   . DVT (deep venous thrombosis) (HCC)    LLE  . Fibromyalgia   . Gallbladder polyp   . H.  pylori infection   . IBS (irritable bowel syndrome)   . Legal blindness    Right eye  . Migraine   . Mood disorder (HCC)   . Psychogenic nonepileptic seizure   . Urinary tract infection     Past Surgical History:  Procedure Laterality Date  . COLONOSCOPY WITH ESOPHAGOGASTRODUODENOSCOPY (EGD)    . HYMENECTOMY  2016    Family Psychiatric History: Please see initial evaluation for full details. I have reviewed the history. No updates at this time.     Family History:  Family History  Problem Relation Age of Onset  . Anxiety disorder Mother   . Depression Mother   . Hyperlipidemia Mother   . Bipolar disorder Mother   . Thyroid disease Maternal Grandmother   . Deep vein thrombosis Maternal Grandmother   . Thyroid disease Maternal Grandfather   . Anxiety disorder Father   . Depression Father   . Autism Sister   . Other Sister        [place] syndrome  . Factor V Leiden deficiency Maternal Aunt   . Breast cancer Maternal Aunt   . Bipolar disorder Maternal Aunt   . Diabetes Paternal Grandfather   . Deep vein thrombosis Maternal Aunt   . Bipolar disorder Maternal Aunt   . Colon cancer Neg Hx   . Esophageal cancer Neg Hx   . Rectal cancer Neg Hx     Social History:  Social History  Socioeconomic History  . Marital status: Single    Spouse name: Not on file  . Number of children: 0  . Years of education: Not on file  . Highest education level: Not on file  Occupational History  . Occupation: Consulting civil engineer  Social Needs  . Financial resource strain: Not on file  . Food insecurity:    Worry: Not on file    Inability: Not on file  . Transportation needs:    Medical: Not on file    Non-medical: Not on file  Tobacco Use  . Smoking status: Never Smoker  . Smokeless tobacco: Never Used  Substance and Sexual Activity  . Alcohol use: Never    Frequency: Never  . Drug use: Never  . Sexual activity: Not Currently  Lifestyle  . Physical activity:    Days per week: Not on  file    Minutes per session: Not on file  . Stress: Not on file  Relationships  . Social connections:    Talks on phone: Not on file    Gets together: Not on file    Attends religious service: Not on file    Active member of club or organization: Not on file    Attends meetings of clubs or organizations: Not on file    Relationship status: Not on file  Other Topics Concern  . Not on file  Social History Narrative   From [place].  Was receiving medical care at [facility]  Resides with mother.    Allergies:  Allergies  Allergen Reactions  . Amitriptyline     Hallucinations  . Sulfa Antibiotics     Metabolic Disorder Labs: No results found for: HGBA1C, MPG No results found for: PROLACTIN Lab Results  Component Value Date   CHOL 171 04/14/2018   TRIG 140 04/14/2018   HDL 50 04/14/2018   CHOLHDL 3.4 04/14/2018   LDLCALC 93 04/14/2018   No results found for: TSH  Therapeutic Level Labs: No results found for: LITHIUM No results found for: VALPROATE No components found for:  CBMZ  Current Medications: Current Outpatient Medications  Medication Sig Dispense Refill  . ALPRAZolam (XANAX) 0.5 MG tablet Take 1 tablet (0.5 mg total) by mouth 3 (three) times daily as needed for anxiety. 90 tablet 0  . Brexpiprazole (REXULTI) 0.5 MG TABS Take 1 tablet (0.5 mg total) by mouth every evening. 90 tablet 0  . hydroxypropyl methylcellulose / hypromellose (ISOPTO TEARS / GONIOVISC) 2.5 % ophthalmic solution Place 1 drop into both eyes 3 (three) times daily as needed for dry eyes.    . hyoscyamine (LEVSIN, ANASPAZ) 0.125 MG tablet Take 0.125 mg by mouth every 6 (six) hours as needed for bladder spasms or cramping.     . lamoTRIgine (LAMICTAL) 100 MG tablet Take 1 tablet (100 mg total) by mouth 2 (two) times daily. 60 tablet 11  . lamoTRIgine (LAMICTAL) 25 MG tablet Take 2 tablets (50 mg total) by mouth every morning. (Patient taking differently: Take 50 mg by mouth daily. ) 60 tablet 0  .  ondansetron (ZOFRAN) 4 MG tablet Take 1 tablet (4 mg total) by mouth every 6 (six) hours as needed for nausea or vomiting. 12 tablet 0  . pantoprazole (PROTONIX) 20 MG tablet Take 1 tablet (20 mg total) by mouth 2 (two) times daily before a meal. 30 tablet 0  . warfarin (COUMADIN) 5 MG tablet Take 5-7.5 mg by mouth See admin instructions. Take 5MG  on tues, thur, sat and sun, then take 7.5mg   on all other days    . [START ON 08/07/2018] ALPRAZolam (XANAX) 0.5 MG tablet Take 1 tablet (0.5 mg total) by mouth at bedtime as needed for anxiety. 90 tablet 1  . escitalopram (LEXAPRO) 10 MG tablet Take 1 tablet (10 mg total) by mouth daily. 90 tablet 0   No current facility-administered medications for this visit.      Musculoskeletal: Strength & Muscle Tone: within normal limits Gait & Station: normal Patient leans: N/A  Psychiatric Specialty Exam: Review of Systems  Psychiatric/Behavioral: Positive for depression. Negative for hallucinations, memory loss, substance abuse and suicidal ideas. The patient is nervous/anxious. The patient does not have insomnia.   All other systems reviewed and are negative.   Blood pressure 92/65, pulse 78, height 5\' 3"  (1.6 m), weight 153 lb (69.4 kg), SpO2 97 %.Body mass index is 27.1 kg/m.  General Appearance: Fairly Groomed  Eye Contact:  Good  Speech:  Clear and Coherent  Volume:  Normal  Mood:  "fine"  Affect:  Appropriate, Congruent and Full Range  Thought Process:  Coherent  Orientation:  Full (Time, Place, and Person)  Thought Content: Logical   Suicidal Thoughts:  No  Homicidal Thoughts:  No  Memory:  Immediate;   Good  Judgement:  Good  Insight:  Good  Psychomotor Activity:  Normal  Concentration:  Concentration: Good and Attention Span: Good  Recall:  Good  Fund of Knowledge: Good  Language: Good  Akathisia:  No  Handed:  Right  AIMS (if indicated): not done  Assets:  Communication Skills Desire for Improvement  ADL's:  Intact   Cognition: WNL  Sleep:  Good   Screenings: PHQ2-9     Office Visit from 04/14/2018 in [facility]  PHQ-2 Total Score  1       Assessment and Plan:  Samantha Dougherty is a 22 y.o. 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  She reports improvement in somatic symptoms of GI symptoms after up titration of lamotrigine by her neurologist.  Given she is still uptitrating lamotrigine, will continue current medication regimen.  Will continue Lexapro to target depression (she could not continue duloxetine due to headache).  Will continue rexulti as adjunctive treatment for depression.  Will continue Xanax as needed for anxiety.  Discussed risk of dependence and oversedation.  She is advised to limit the use if able.  Discussed behavioral activation.   # r/o fibromyalgia The patient has some features of fibromyalgia.  She will greatly benefit from a graded exercise; she will discuss this option with her mother.   Plan I have reviewed and updated plans as below 1. Continue Lexapro 10 mg daily  2. Continue Rexulti 0.5 mg daily  3. Continue Xanax 0.5 mg three times a day for anxiety 4. Continue lamotrigine at the current dose with uptitration (prescribed by neurologist) 5. Return to clinic in three month for 30 mins  6. Discuss with your mother about referral for Graded exercise (physical therapy) - obtain record from her prior psychiatrist  - She decided not to do IOP due to difficulty in transporation  Past trials of medication:  Lexapro,  duloxetine (headache), elavil (hallucinations),  rexulti, Abilify, Xanax, lamotrigine, temazepam  The patient demonstrates the following risk factors for suicide: Chronic risk factors for suicide include: psychiatric disorder of depression and chronic pain. Acute risk factors for suicide include: unemployment and social withdrawal/isolation. Protective factors for this patient include: positive social support, coping skills and hope for the future. Considering these factors, the overall suicide risk at this point appears to be low. Patient is appropriate for outpatient follow up.  The duration of this appointment visit was 30 minutes of face-to-face time with the patient.  Greater than 50% of this time was spent in counseling, explanation of  diagnosis, planning of further management, and coordination of care.  Neysa Hotter, MD 07/07/2018, 10:41 AM

## 2018-07-04 NOTE — Progress Notes (Signed)
PATIENT: Samantha Dougherty DOB: July 09, 1996  Chief Complaint  Patient presents with  . New Patient (Initial Visit)    PCP/Referring: Dr. Nadine Counts. With mother and father. Vision: 20/30 with correction.  . Seizures    Pt reports that she has non-epilectic seizures and has been treated with lamictal. Has many questions about lamictal. Pt increased lamictal by herself and noticed improvement in her daily spells.     HISTORICAL  Samantha Dougherty is a 22 years old female, seen in request by her primary care physician Dr. Nadine Counts, Kathie Rhodes for evaluation of seizure-like event, diffuse body achy pain, she is accompanied by her parents at today's clinical visit, initial evaluation was on July 04, 2018.   I have reviewed and summarized the referring note from the referring physician.  Father reported that she was born premature at 40 weeks, only 2 pounds, had problem all her life, began to develop seizure-like event since age 55.  when she began her menstruation.  I was able to watch some video clips of her event, there are different semiology, she usually presented by eye blinking, confused, head bobbing, it can last from few minutes to hours,  She recently moved from [place] to [place], I was able to review extensive previous evaluations,  EKG was normal on May 08, 2018, CT head without contrast  was normal, MRI of the brain with without contrast on 05/04/2014 was normal. MRI of lumbar with without contrast showed no significant abnormality April 2017 MRA of the head in April 2018, showed no significant abnormality, MRI of the brain with without contrast was normal April 2018,  Laboratory evaluations showed normal CBC with hemoglobin of 14.2 vitamin D was 52,  pylori was negative, normal TSH, CPK, magnesium 2.3, phosphate 3.0, CMP, creatinine was 0.17, vitamin B6  She had multiple EEGs, sleep deprived EEG in May 2018 showed left frontotemporal theater and delta activity Ambulatory  EEG from May 21 to 24/2018 there was documented seizure-like event, body shaking, could not talk, spitting mucus, gagging, there was no epileptiform discharge noted,  Video monitoring 24 hours EEG July 17 11/07/2016 described episode abdominal contraction jerking movement, head bobbing, abdominal contraction, but there was no EEG abnormality, ,  EMG in July 2018 was reported normal  She is also treated by psychiatrist, was diagnosed with depression, anxiety, was started on lamotrigine titrating dose since 2018, she reported better with higher dose of lamotrigine 50 mg in the morning, 25 mg at nighttime, higher dose of lamotrigine to help her emotions better.  Nerve block was performed in August 2018 for occipital neuralgia failed to help her symptoms  CT abdomen pelvic with contrast in December 2018 was unremarkable  Ultrasound of abdomen and pelvic showed no significant abnormality  She has constellation of complaints besides her seizure-like activity, she complains of diffuse body achy pain, daily and seizure-like spells, could not function, needing helping her daily activity, such as dressing, bathing  REVIEW OF SYSTEMS: Full 14 system review of systems performed and notable only for chills, weight gain, fatigue, chest pain, heart palpitation, ringing the ears, spinning sensation, rash, itching, blurred vision, double vision, eye pain, shortness of breath, cough, diarrhea, constipation, urination problems, feeling hot, cold, flushing, joint pain, cramps, aching muscles, skin sensitivity, memory loss, confusion, headaches, numbness, weakness, dizziness, seizure, passing out, insomnia, sleepiness, restless leg, depression, anxiety, too much sleep, not enough sleep, lack of allergies, disinterested in activities, intrusive thoughts, racing thoughts,  all other review of systems were  negative.  ALLERGIES: Allergies  Allergen Reactions  . Amitriptyline     Hallucinations  . Sulfa Antibiotics       HOME MEDICATIONS: Current Outpatient Medications  Medication Sig Dispense Refill  . ALPRAZolam (XANAX) 0.5 MG tablet Take 1 tablet (0.5 mg total) by mouth 3 (three) times daily as needed for anxiety. 90 tablet 0  . Brexpiprazole (REXULTI) 0.5 MG TABS Take 1 tablet (0.5 mg total) by mouth every evening. 30 tablet 0  . escitalopram (LEXAPRO) 10 MG tablet Take 10 mg by mouth daily.    . hydroxypropyl methylcellulose / hypromellose (ISOPTO TEARS / GONIOVISC) 2.5 % ophthalmic solution Place 1 drop into both eyes 3 (three) times daily as needed for dry eyes.    . hyoscyamine (LEVSIN, ANASPAZ) 0.125 MG tablet Take 0.125 mg by mouth every 6 (six) hours as needed for bladder spasms or cramping.     . lamoTRIgine (LAMICTAL) 25 MG tablet Take 2 tablets (50 mg total) by mouth every morning. (Patient taking differently: Take 50 mg by mouth daily. ) 60 tablet 0  . ondansetron (ZOFRAN) 4 MG tablet Take 1 tablet (4 mg total) by mouth every 6 (six) hours as needed for nausea or vomiting. 12 tablet 0  . pantoprazole (PROTONIX) 20 MG tablet Take 1 tablet (20 mg total) by mouth 2 (two) times daily before a meal. 30 tablet 0  . warfarin (COUMADIN) 5 MG tablet Take 5-7.5 mg by mouth See admin instructions. Take 5MG  on tues, thur, sat and sun, then take 7.5mg  on all other days     No current facility-administered medications for this visit.     PAST MEDICAL HISTORY: Past Medical History:  Diagnosis Date  . Anxiety   . Asthma   . Bipolar 2 disorder (HCC)   . Conversion disorder   . Depression   . DVT (deep venous thrombosis) (HCC)    LLE  . Fibromyalgia   . Gallbladder polyp   . H. pylori infection   . IBS (irritable bowel syndrome)   . Legal blindness    Right eye  . Migraine   . Mood disorder (HCC)   . Psychogenic nonepileptic seizure   . Urinary tract infection     PAST SURGICAL HISTORY: Past Surgical History:  Procedure Laterality Date  . COLONOSCOPY WITH ESOPHAGOGASTRODUODENOSCOPY (EGD)     . HYMENECTOMY  2016    FAMILY HISTORY: Family History  Problem Relation Age of Onset  . Anxiety disorder Mother   . Depression Mother   . Hyperlipidemia Mother   . Bipolar disorder Mother   . Thyroid disease Maternal Grandmother   . Deep vein thrombosis Maternal Grandmother   . Thyroid disease Maternal Grandfather   . Anxiety disorder Father   . Depression Father   . Autism Sister   . Other Sister        [place] syndrome  . Factor V Leiden deficiency Maternal Aunt   . Breast cancer Maternal Aunt   . Bipolar disorder Maternal Aunt   . Diabetes Paternal Grandfather   . Deep vein thrombosis Maternal Aunt   . Bipolar disorder Maternal Aunt   . Colon cancer Neg Hx   . Esophageal cancer Neg Hx   . Rectal cancer Neg Hx     SOCIAL HISTORY: Social History   Socioeconomic History  . Marital status: Single    Spouse name: Not on file  . Number of children: 0  . Years of education: Not on file  . Highest education level:  Not on file  Occupational History  . Occupation: Consulting civil engineer  Social Needs  . Financial resource strain: Not on file  . Food insecurity:    Worry: Not on file    Inability: Not on file  . Transportation needs:    Medical: Not on file    Non-medical: Not on file  Tobacco Use  . Smoking status: Never Smoker  . Smokeless tobacco: Never Used  Substance and Sexual Activity  . Alcohol use: Never    Frequency: Never  . Drug use: Never  . Sexual activity: Not Currently  Lifestyle  . Physical activity:    Days per week: Not on file    Minutes per session: Not on file  . Stress: Not on file  Relationships  . Social connections:    Talks on phone: Not on file    Gets together: Not on file    Attends religious service: Not on file    Active member of club or organization: Not on file    Attends meetings of clubs or organizations: Not on file    Relationship status: Not on file  . Intimate partner violence:    Fear of current or ex partner: Not on file     Emotionally abused: Not on file    Physically abused: Not on file    Forced sexual activity: Not on file  Other Topics Concern  . Not on file  Social History Narrative   From [place].  Was receiving medical care at [facility].  Resides with mother.     PHYSICAL EXAM   Vitals:   07/04/18 1030  BP: 106/71  Pulse: 80  Weight: 155 lb (70.3 kg)  Height: 5\' 3"  (1.6 m)    Not recorded      Body mass index is 27.46 kg/m.  PHYSICAL EXAMNIATION:  Gen: NAD, conversant, well nourised, obese, well groomed                     Cardiovascular: Regular rate rhythm, no peripheral edema, warm, nontender. Eyes: Conjunctivae clear without exudates or hemorrhage Neck: Supple, no carotid bruits. Pulmonary: Clear to auscultation bilaterally   NEUROLOGICAL EXAM:  MENTAL STATUS: Speech:    Speech is normal; fluent and spontaneous with normal comprehension.  Cognition:     Orientation to time, place and person     Normal recent and remote memory     Normal Attention span and concentration     Normal Language, naming, repeating,spontaneous speech     Fund of knowledge   CRANIAL NERVES: CN II: Visual fields are full to confrontation. Fundoscopic exam is normal with sharp discs and no vascular changes. Pupils are round equal and briskly reactive to light. CN III, IV, VI: extraocular movement are normal. No ptosis. CN V: Facial sensation is intact to pinprick in all 3 divisions bilaterally. Corneal responses are intact.  CN VII: Face is symmetric with normal eye closure and smile. CN VIII: Hearing is normal to rubbing fingers CN IX, X: Palate elevates symmetrically. Phonation is normal. CN XI: Head turning and shoulder shrug are intact CN XII: Tongue is midline with normal movements and no atrophy.  MOTOR: There is no pronator drift of out-stretched arms. Muscle bulk and tone are normal. Muscle strength is normal.  REFLEXES: Reflexes are 2+ and symmetric at the biceps, triceps, knees, and  ankles. Plantar responses are flexor.  SENSORY: Intact to light touch, pinprick, positional sensation and vibratory sensation are intact in fingers and toes.  COORDINATION: Rapid alternating movements and fine finger movements are intact. There is no dysmetria on finger-to-nose and heel-knee-shin.    GAIT/STANCE: Posture is normal. Gait is steady with normal steps, base, arm swing, and turning. Heel and toe walking are normal. Tandem gait is normal.  Romberg is absent.   DIAGNOSTIC DATA (LABS, IMAGING, TESTING) - I reviewed patient records, labs, notes, testing and imaging myself where available.   ASSESSMENT AND PLAN  Samantha Dougherty is a 22 y.o. female   Seizure-like activities,  Variable semiology with each episode  Extensive evaluation in the past, including video EEG, ambulatory EEG did not show epileptiform discharge during the event,  Her mood disorder likely contributed significantly to her complaints,  She reported significant improvement with higher dose of lamotrigine, I will slow titrating her lamotrigine to 100 mg twice a day  Repeat EEG  Family desires academic evaluations, I will refer her to [facility] neurology   Levert Feinstein, M.D. Ph.D.  [facility] [address] [place], [place] [address] Ph: [phone] Fax: [phone]  CC: Raliegh Ip, DO

## 2018-07-05 ENCOUNTER — Other Ambulatory Visit (HOSPITAL_COMMUNITY): Payer: Self-pay | Admitting: Psychiatry

## 2018-07-05 ENCOUNTER — Telehealth: Payer: Self-pay | Admitting: Neurology

## 2018-07-05 MED ORDER — DULOXETINE HCL 60 MG PO CPEP: 60.0000 mg | ORAL_CAPSULE | Freq: Every day | ORAL | 0 refills | Status: DC

## 2018-07-05 MED ORDER — BREXPIPRAZOLE 0.5 MG PO TABS: 0.5000 mg | ORAL_TABLET | Freq: Every evening | ORAL | 0 refills | Status: DC

## 2018-07-05 NOTE — Telephone Encounter (Signed)
Sent to [facility] Telephone [phone] - fax [phone] . I will call patient and give her telephone number as well. [facility] will call patient to schedule. Thanks Annabelle Harman.

## 2018-07-07 ENCOUNTER — Encounter (HOSPITAL_COMMUNITY): Payer: Self-pay | Admitting: Psychiatry

## 2018-07-07 ENCOUNTER — Ambulatory Visit (INDEPENDENT_AMBULATORY_CARE_PROVIDER_SITE_OTHER): Payer: 59 | Admitting: Psychiatry

## 2018-07-07 VITALS — BP 92/65 | HR 78 | Ht 63.0 in | Wt 153.0 lb

## 2018-07-07 DIAGNOSIS — F419 Anxiety disorder, unspecified: Secondary | ICD-10-CM

## 2018-07-07 DIAGNOSIS — F331 Major depressive disorder, recurrent, moderate: Secondary | ICD-10-CM | POA: Diagnosis not present

## 2018-07-07 MED ORDER — BREXPIPRAZOLE 0.5 MG PO TABS: 0.5000 mg | ORAL_TABLET | Freq: Every evening | ORAL | 0 refills | Status: DC

## 2018-07-07 MED ORDER — ESCITALOPRAM OXALATE 10 MG PO TABS: 10.0000 mg | ORAL_TABLET | Freq: Every day | ORAL | 0 refills | Status: DC

## 2018-07-07 MED ORDER — ALPRAZOLAM 0.5 MG PO TABS: 0.5000 mg | ORAL_TABLET | Freq: Every evening | ORAL | 1 refills | Status: DC | PRN

## 2018-07-07 NOTE — Patient Instructions (Signed)
1. Continue lexapro 10 mg daily  2. Continue Rexulti 0.5 mg daily  3. Continue Xanax 0.5 mg three times a day for anxiety 4. Continue lamotrigine at the current dose 5. Return to clinic in three month for 30 mins  6. Discuss with your mother about referral for Graded exercise (physical therapy)

## 2018-07-17 ENCOUNTER — Telehealth: Payer: Self-pay

## 2018-07-17 ENCOUNTER — Encounter: Payer: Self-pay | Admitting: Family

## 2018-07-17 ENCOUNTER — Ambulatory Visit: Payer: 59 | Admitting: Family

## 2018-07-17 VITALS — BP 101/64 | HR 70 | Temp 96.6°F | Ht 63.0 in | Wt 155.0 lb

## 2018-07-17 DIAGNOSIS — J4520 Mild intermittent asthma, uncomplicated: Secondary | ICD-10-CM

## 2018-07-17 DIAGNOSIS — Z7901 Long term (current) use of anticoagulants: Secondary | ICD-10-CM

## 2018-07-17 DIAGNOSIS — Z86718 Personal history of other venous thrombosis and embolism: Secondary | ICD-10-CM | POA: Diagnosis not present

## 2018-07-17 LAB — COAGUCHEK XS/INR WAIVED
INR: 2.3 — AB (ref 0.9–1.1)
Prothrombin Time: 27.7 s

## 2018-07-17 NOTE — Addendum Note (Signed)
Addended by: Almeta Monas on: 07/17/2018 10:08 AM   Modules accepted: Orders

## 2018-07-17 NOTE — Telephone Encounter (Signed)
Called patient this morning to follow up on requesting records.  She states that she called her past GI practice a couple of weeks ago to request records.  I told her I have not received them.  I asked her to call their office again and find out status because her follow up appt is 08/03/18 and we must have records before we see her again.  She states she will call and get back with me.

## 2018-07-17 NOTE — Addendum Note (Signed)
Addended by: Margorie John on: 07/17/2018 10:15 AM   Modules accepted: Orders

## 2018-07-17 NOTE — Patient Instructions (Signed)

## 2018-07-17 NOTE — Progress Notes (Signed)
   Subjective:    Patient ID: Samantha Dougherty, female    DOB: 11/07/1995, 22 y.o.   MRN: [id number]  Chief Complaint  Patient presents with  . protime recheck    HPI Pt presents to the office today for INR recheck. She has had a hx of DVT and has had genetic testing. She is followed by Hematologists. She is currently taking warfarin. She denies any missed doses, bleeding, or changes medications.    States she had some slight bruising. See anticoagulation flow sheet.     She is complaining intermittent wheezing and chest tightness. She takes albuterol as needed for asthma. She states this is usually well controlled.    Review of Systems  All other systems reviewed and are negative.      Objective:   Physical Exam  Constitutional: She is oriented to person, place, and time. She appears well-developed and well-nourished. No distress.  HENT:  Head: Normocephalic and atraumatic.  Right Ear: External ear normal.  Left Ear: External ear normal.  Mouth/Throat: Oropharynx is clear and moist.  Eyes: Pupils are equal, round, and reactive to light.  Neck: Normal range of motion. Neck supple. No thyromegaly present.  Cardiovascular: Normal rate, regular rhythm, normal heart sounds and intact distal pulses.  No murmur heard. Pulmonary/Chest: Effort normal and breath sounds normal. No respiratory distress. She has no wheezes.  Abdominal: Soft. Bowel sounds are normal. She exhibits no distension. There is no tenderness.  Musculoskeletal: Normal range of motion. She exhibits no edema or tenderness.  Neurological: She is alert and oriented to person, place, and time. She has normal reflexes. No cranial nerve deficit.  Skin: Skin is warm and dry.  Psychiatric: She has a normal mood and affect. Her behavior is normal. Judgment and thought content normal.  Vitals reviewed.     BP 101/64   Pulse 70   Ht 5\' 3"  (1.6 m)   Wt 155 lb (70.3 kg)   BMI 27.46 kg/m      Assessment & Plan:  Samantha Dougherty comes in today with chief complaint of protime recheck   Diagnosis and orders addressed:  1. On anticoagulant therapy  2. History of DVT of lower extremity  3. Mild intermittent asthma without complication Start claritin daily Albuterol as needed for wheezing or SOB   INR at goal  Keep Hematologists appts and follow up with PCP    Jannifer Rodney, FNP

## 2018-07-18 ENCOUNTER — Other Ambulatory Visit: Payer: 59 | Admitting: Adult Health

## 2018-07-18 ENCOUNTER — Other Ambulatory Visit (HOSPITAL_COMMUNITY): Payer: Self-pay | Admitting: Psychiatry

## 2018-07-18 ENCOUNTER — Telehealth (HOSPITAL_COMMUNITY): Payer: Self-pay | Admitting: *Deleted

## 2018-07-18 MED ORDER — ALPRAZOLAM 0.5 MG PO TABS: 0.5000 mg | ORAL_TABLET | Freq: Two times a day (BID) | ORAL | 0 refills | Status: DC | PRN

## 2018-07-18 MED ORDER — ALPRAZOLAM 0.5 MG PO TABS: 0.5000 mg | ORAL_TABLET | Freq: Three times a day (TID) | ORAL | 1 refills | Status: DC | PRN

## 2018-07-18 NOTE — Telephone Encounter (Signed)
Sorry. I ordered additional refill of 0.5 mg 60 tabs to last until next month. She can take up to three times a day. Please contact the pharmacy to discard the remaining old order of 0.5 mg qhsprn (there should be two refills). I ordered new refill (0,5 mg three times a day )starting in November with one refill, fyi.

## 2018-07-18 NOTE — Telephone Encounter (Signed)
Dr Vanetta Shawl Patient's mom stopped by after seeing Dr Tenny Craw to inquire about the Xanax  Script. Dad came home with the  Xanax take 1 tablet (0.5 mg total) by mouth at bedtime as needed for anxiety & she  Thinks it should be the   XananxTake 1 tablet (0.5 mg total) by mouth 3 (three) times daily as needed for anxiety. Called Rx & they gave the Bedtime dosage because the other script take 3 x daily has 0 refills. Please clarify & send scripts as necessary

## 2018-07-26 ENCOUNTER — Ambulatory Visit (INDEPENDENT_AMBULATORY_CARE_PROVIDER_SITE_OTHER): Payer: 59 | Admitting: Pharmacist Clinician (PhC)/ Clinical Pharmacy Specialist

## 2018-07-26 DIAGNOSIS — Z86718 Personal history of other venous thrombosis and embolism: Secondary | ICD-10-CM | POA: Diagnosis not present

## 2018-07-26 DIAGNOSIS — Z7901 Long term (current) use of anticoagulants: Secondary | ICD-10-CM

## 2018-07-26 LAB — COAGUCHEK XS/INR WAIVED
INR: 3 — AB (ref 0.9–1.1)
PROTHROMBIN TIME: 36.5 s

## 2018-07-26 NOTE — Patient Instructions (Signed)
Description   INR today 3.0 (at goal)  Continue Current dose of 7.5 mg on Monday, Wednesday, Friday and 5 mg on all other days.   Eat a high vitamin K food twice a week  Goal- INR 2-3

## 2018-08-03 ENCOUNTER — Encounter: Payer: Self-pay | Admitting: Gastroenterology

## 2018-08-03 ENCOUNTER — Ambulatory Visit (INDEPENDENT_AMBULATORY_CARE_PROVIDER_SITE_OTHER): Payer: Medicare Other | Admitting: Gastroenterology

## 2018-08-03 VITALS — BP 106/62 | HR 70 | Ht 63.0 in | Wt 154.0 lb

## 2018-08-03 DIAGNOSIS — K580 Irritable bowel syndrome with diarrhea: Secondary | ICD-10-CM | POA: Diagnosis not present

## 2018-08-03 DIAGNOSIS — K824 Cholesterolosis of gallbladder: Secondary | ICD-10-CM

## 2018-08-03 DIAGNOSIS — R1013 Epigastric pain: Secondary | ICD-10-CM | POA: Diagnosis not present

## 2018-08-03 DIAGNOSIS — R110 Nausea: Secondary | ICD-10-CM

## 2018-08-03 DIAGNOSIS — K30 Functional dyspepsia: Secondary | ICD-10-CM

## 2018-08-03 MED ORDER — RIFAXIMIN 550 MG PO TABS: 550.0000 mg | ORAL_TABLET | Freq: Three times a day (TID) | ORAL | 0 refills | Status: DC

## 2018-08-03 MED ORDER — PROMETHAZINE HCL 12.5 MG PO TABS: 12.5000 mg | ORAL_TABLET | Freq: Two times a day (BID) | ORAL | 1 refills | Status: DC | PRN

## 2018-08-03 NOTE — Progress Notes (Signed)
HPI :  22 year old female here for a follow-up visit, accompanied by her mother today. This is my first time seeing her, she was seen by Willette Cluster in September, please refer to that clinic note for full history of her case.  She has multiple chronic GI complaints dating back to childhood. Main symptoms appear to be chronic nausea, postprandial upper abdominal pain, periodic vomiting, altered bowel habits, abdominal bloating. She's had an extensive evaluation for the symptoms while living in [place] previously. Her workup as outlined below. She's had upper endoscopy, colonoscopy, ultrasound, multiple CT scans, gastric emptying study. She reports all of these studies have failed to yield a clear etiology other than being diagnosed with H. Pylori at age 57. She has fatty liver noted on CT imaging and a gallbladder polyp noted on [place].  She is extremely nauseated after eating much of anything. She is mostly nauseous after eating and is reliably occurs. She also feels quite full after eating and can have some regurgitation with occasional vomiting. She denies much irises. She has discomfort in her upper abdomen after she eats. She does have intermittent abdominal distention, she states she looks "pregnant" after eating at times. She has been tried on multitude of antacids in the past, none of which have helped. She been tried on Carafate which did not help. She was been given Zofran for nausea she doesn't help.  She otherwise reports her bowel habits go back and forth between constipation and loose stools. She thinks loose stools may slightly predominate the constipation. She has not really tried many options for treatment for this. No blood in the stools.  She is on multiple psychotropic drugs. Recently lamictal has been increased for treatment of seizures. She reports symptoms started long before she started lamictal. She also reports symptoms started before using brexpiprazole. She denies NSAIDs  or other OTCs.  Several minutes spent this visit reviewing records as outlined below:  EGD 02/15/2012 - at age 69 - normal exam - normal duodenal biopsies, H pylori biposies positive, mild gastritis EGD 01/20/2013 - normal exam - normal duodenal biopsies, gastric biopsies negative for H pylori, normal esophageal biopsies Colonoscopy 2014 - normal exam - random biopsies taken and normal  CT scan 08-24-2017 - fatty liver noted, small umbilical hernia,  [place] 03/16/2018 - 5mm gallbladder polyp, otherwise normal US pelvis 03/16/2018 - normal CT abdomen / pelvis - 06/05/2018 - small GB polyp Reported history of gastric emptying study but no records available TTG IgA negative  Past Medical History:  Diagnosis Date  . Anxiety   . Asthma   . Bipolar 2 disorder (HCC)   . Conversion disorder   . Depression   . DVT (deep venous thrombosis) (HCC)    LLE  . Fibromyalgia   . Gallbladder polyp   . H. pylori infection   . IBS (irritable bowel syndrome)   . Legal blindness    Right eye  . Migraine   . Mood disorder (HCC)   . Psychogenic nonepileptic seizure   . Urinary tract infection      Past Surgical History:  Procedure Laterality Date  . COLONOSCOPY WITH ESOPHAGOGASTRODUODENOSCOPY (EGD)    . HYMENECTOMY  2016   Family History  Problem Relation Age of Onset  . Anxiety disorder Mother   . Depression Mother   . Hyperlipidemia Mother   . Bipolar disorder Mother   . Thyroid disease Maternal Grandmother   . Deep vein thrombosis Maternal Grandmother   . Thyroid disease Maternal Grandfather   .  Anxiety disorder Father   . Depression Father   . Autism Sister   . Other Sister        [place] syndrome  . Factor V Leiden deficiency Maternal Aunt   . Breast cancer Maternal Aunt   . Bipolar disorder Maternal Aunt   . Diabetes Paternal Grandfather   . Deep vein thrombosis Maternal Aunt   . Bipolar disorder Maternal Aunt   . Colon cancer Neg Hx   . Esophageal cancer Neg Hx   . Rectal cancer  Neg Hx    Social History   Tobacco Use  . Smoking status: Never Smoker  . Smokeless tobacco: Never Used  Substance Use Topics  . Alcohol use: Never    Frequency: Never  . Drug use: Never   Current Outpatient Medications  Medication Sig Dispense Refill  . [START ON 08/07/2018] ALPRAZolam (XANAX) 0.5 MG tablet Take 1 tablet (0.5 mg total) by mouth 3 (three) times daily as needed for anxiety. 90 tablet 1  . ALPRAZolam (XANAX) 0.5 MG tablet Take 1 tablet (0.5 mg total) by mouth 2 (two) times daily as needed for anxiety ((take total of up to three times a day for anxiety)). 60 tablet 0  . Brexpiprazole (REXULTI) 0.5 MG TABS Take 1 tablet (0.5 mg total) by mouth every evening. 90 tablet 0  . escitalopram (LEXAPRO) 10 MG tablet Take 1 tablet (10 mg total) by mouth daily. 90 tablet 0  . hydroxypropyl methylcellulose / hypromellose (ISOPTO TEARS / GONIOVISC) 2.5 % ophthalmic solution Place 1 drop into both eyes 3 (three) times daily as needed for dry eyes.    . hyoscyamine (LEVSIN, ANASPAZ) 0.125 MG tablet Take 0.125 mg by mouth every 6 (six) hours as needed for bladder spasms or cramping.     . lamoTRIgine (LAMICTAL) 100 MG tablet Take 1 tablet (100 mg total) by mouth 2 (two) times daily. 60 tablet 11  . lamoTRIgine (LAMICTAL) 25 MG tablet Take 2 tablets (50 mg total) by mouth every morning. (Patient taking differently: Take 50 mg by mouth daily. ) 60 tablet 0  . ondansetron (ZOFRAN) 4 MG tablet Take 1 tablet (4 mg total) by mouth every 6 (six) hours as needed for nausea or vomiting. 12 tablet 0  . warfarin (COUMADIN) 7.5 MG tablet Take 7.5 mg by mouth daily.     No current facility-administered medications for this visit.    Allergies  Allergen Reactions  . Amitriptyline     Hallucinations  . Sulfa Antibiotics      Review of Systems: All systems reviewed and negative except where noted in HPI.   Lab Results  Component Value Date   WBC 6.9 06/26/2018   HGB 14.0 06/26/2018   HCT 41.8  06/26/2018   MCV 91.3 06/26/2018   PLT 257 06/26/2018    Lab Results  Component Value Date   CREATININE 0.62 06/26/2018   BUN 11 06/26/2018   NA 138 06/26/2018   K 3.7 06/26/2018   CL 104 06/26/2018   CO2 26 06/26/2018    Lab Results  Component Value Date   ALT 18 06/26/2018   AST 27 06/26/2018   ALKPHOS 68 06/26/2018   BILITOT 0.5 06/26/2018     Physical Exam: BP 106/62   Pulse 70   Ht 5\' 3"  (1.6 m)   Wt 154 lb (69.9 kg)   BMI 27.28 kg/m  Constitutional: Pleasant,well-developed, female in no acute distress. HEENT: Normocephalic and atraumatic. Conjunctivae are normal. No scleral icterus. Neck supple.  Cardiovascular: Normal rate, regular rhythm.  Pulmonary/chest: Effort normal and breath sounds normal. No wheezing, rales or rhonchi. Abdominal: Soft, nondistended, nontender. . There are no masses palpable. No hepatomegaly. Extremities: no edema Lymphadenopathy: No cervical adenopathy noted. Neurological: Alert and oriented to person place and time. Skin: Skin is warm and dry. No rashes noted. Psychiatric: Normal mood and affect. Behavior is normal.   ASSESSMENT AND PLAN: 22 year old female here for follow-up visit for reassessment of issues as outlined:  Chronic nausea / dyspepsia / gallbladder polyp / suspected IBS-D - main issue appears to be chronic nausea with dyspepsia. She also has chronic bowel symptoms that bother her. I discussed differential with the patient and her mother. I suspect she likely has functional symptoms given her workup to date. I don't see a prior gastric emptying study results but she states she has had this twice in the past and negative. While I suspect she probably has functional dyspepsia with irritable bowel syndrome, given her postprandial epigastric pain, will refer for a HIDA scan to ensure no evidence of gallbladder dyskinesia. In the interim we'll stop the Zofran given it's not helping, we'll try Phenergan 12.5 mg PRN and see if that  works better. Given her history of H. Pylori, we'll recheck a H. Pylori stool antigen to ensure negative. I otherwise offered her a trial of rifaximin 550 3 times a day for 2 weeks for treatment of IBS D in her bloating and see if that helps. If no improvement despite these measures, we discussed options for treatment of functional dyspepsia. I would consider a trial of FD Delene Ruffini but would otherwise need to speak with her psychiatrist about consideration for buspirone or Remeron, or TCA. For the gallbladder polyp may repeat an [place] in one year to ensure stable size if the HIDA is otherwise normal. She agreed with the plan.   Ileene Patrick, MD [facility]

## 2018-08-03 NOTE — Patient Instructions (Signed)
We have sent the following medications to your pharmacy for you to pick up at your convenience:  Xifaxan, Phenergan  Discontinue Zofran   You have been [facility] on 08/21/2018. Please arrive 30 minutes prior to your scheduled appointment at  0:45WU7:30am. Make certain not to have anything to eat or drink at least 6 hours prior to your test. Should this appointment date or time not work well for you, please call radiology scheduling at [phone].  _____________________________________________________________________ hepatobiliary (HIDA) scan is an imaging procedure used to diagnose problems in the liver, gallbladder and bile ducts. In the HIDA scan, a radioactive chemical or tracer is injected into a vein in your arm. The tracer is handled by the liver like bile. Bile is a fluid produced and excreted by your liver that helps your digestive system break down fats in the foods you eat. Bile is stored in your gallbladder and the gallbladder releases the bile when you eat a meal. A special nuclear medicine scanner (gamma camera) tracks the flow of the tracer from your liver into your gallbladder and small intestine.  During your HIDA scan  You'll be asked to change into a hospital gown before your HIDA scan begins. Your health care team will position you on a table, usually on your back. The radioactive tracer is then injected into a vein in your arm.The tracer travels through your bloodstream to your liver, where it's taken up by the bile-producing cells. The radioactive tracer travels with the bile from your liver into your gallbladder and through your bile ducts to your small intestine.You may feel some pressure while the radioactive tracer is injected into your vein. As you lie on the table, a special gamma camera is positioned over your abdomen taking pictures of the tracer as it moves through your body. The gamma camera takes pictures continually for about  an hour. You'll need to keep still during the HIDA scan. This can become uncomfortable, but you may find that you can lessen the discomfort by taking deep breaths and thinking about other things. Tell your health care team if you're uncomfortable. The radiologist will watch on a computer the progress of the radioactive tracer through your body. The HIDA scan may be stopped when the radioactive tracer is seen in the gallbladder and enters your small intestine. This typically takes about an hour. In some cases extra imaging will be performed if original images aren't satisfactory, if morphine is given to help visualize the gallbladder or if the medication CCK is given to look at the contraction of the gallbladder. This test typically takes 2 hours to complete. ________________________________________________________________________

## 2018-08-09 ENCOUNTER — Encounter: Payer: Self-pay | Admitting: Family Medicine

## 2018-08-09 ENCOUNTER — Ambulatory Visit: Payer: 59 | Admitting: Family Medicine

## 2018-08-09 ENCOUNTER — Encounter: Payer: 59 | Admitting: Family Medicine

## 2018-08-09 VITALS — BP 107/66 | HR 88 | Temp 97.0°F | Ht 63.0 in | Wt 152.0 lb

## 2018-08-09 DIAGNOSIS — Z7901 Long term (current) use of anticoagulants: Secondary | ICD-10-CM

## 2018-08-09 DIAGNOSIS — Z86718 Personal history of other venous thrombosis and embolism: Secondary | ICD-10-CM | POA: Diagnosis not present

## 2018-08-09 DIAGNOSIS — D6862 Lupus anticoagulant syndrome: Secondary | ICD-10-CM

## 2018-08-09 LAB — COAGUCHEK XS/INR WAIVED
INR: 4.2 — ABNORMAL HIGH (ref 0.9–1.1)
Prothrombin Time: 50.1 s

## 2018-08-09 NOTE — Progress Notes (Signed)
Subjective: CC: chronic anticoagulation PCP: Raliegh Ip, DO ZOX:WRUE D. Oelkers is a 22 y.o. female presenting to clinic today for:  1.  History of DVT/lupus anticoagulant Patient on chronic anticoagulation with Coumadin.  She takes 7.5 mg Monday Wednesday Friday and 5 mg all other days.  She notes compliance with the Coumadin.  She has noticed some bleeding of her gums when she brushes her teeth.  Denies any rectal bleeding, vaginal bleeding or hematuria.  She has not changed her diet but does note that she had a temporary increase in her seizure medication for a few days.  Last INR was therapeutic at 3.0.  She goes on to report that she has been referred to [facility] hematology because she has fluctuating Coumadin levels.   ROS: Per HPI  Allergies  Allergen Reactions  . Amitriptyline     Hallucinations  . Sulfa Antibiotics    Past Medical History:  Diagnosis Date  . Anxiety   . Asthma   . Bipolar 2 disorder (HCC)   . Conversion disorder   . Depression   . DVT (deep venous thrombosis) (HCC)    LLE  . Fibromyalgia   . Gallbladder polyp   . H. pylori infection   . IBS (irritable bowel syndrome)   . Legal blindness    Right eye  . Migraine   . Mood disorder (HCC)   . Psychogenic nonepileptic seizure   . Urinary tract infection     Current Outpatient Medications:  .  ALPRAZolam (XANAX) 0.5 MG tablet, Take 1 tablet (0.5 mg total) by mouth 3 (three) times daily as needed for anxiety., Disp: 90 tablet, Rfl: 1 .  Brexpiprazole (REXULTI) 0.5 MG TABS, Take 1 tablet (0.5 mg total) by mouth every evening., Disp: 90 tablet, Rfl: 0 .  escitalopram (LEXAPRO) 10 MG tablet, Take 1 tablet (10 mg total) by mouth daily., Disp: 90 tablet, Rfl: 0 .  hydroxypropyl methylcellulose / hypromellose (ISOPTO TEARS / GONIOVISC) 2.5 % ophthalmic solution, Place 1 drop into both eyes 3 (three) times daily as needed for dry eyes., Disp: , Rfl:  .  hyoscyamine (LEVSIN, ANASPAZ) 0.125 MG tablet, Take  0.125 mg by mouth every 6 (six) hours as needed for bladder spasms or cramping. , Disp: , Rfl:  .  lamoTRIgine (LAMICTAL) 100 MG tablet, Take 1 tablet (100 mg total) by mouth 2 (two) times daily., Disp: 60 tablet, Rfl: 11 .  promethazine (PHENERGAN) 12.5 MG tablet, Take 1 tablet (12.5 mg total) by mouth every 12 (twelve) hours as needed for nausea or vomiting. (Patient not taking: Reported on 08/09/2018), Disp: 60 tablet, Rfl: 1 .  rifaximin (XIFAXAN) 550 MG TABS tablet, Take 1 tablet (550 mg total) by mouth 3 (three) times daily. (Patient not taking: Reported on 08/09/2018), Disp: 42 tablet, Rfl: 0 .  warfarin (COUMADIN) 7.5 MG tablet, Take 7.5 mg by mouth daily., Disp: , Rfl:  Social History   Socioeconomic History  . Marital status: Single    Spouse name: Not on file  . Number of children: 0  . Years of education: Not on file  . Highest education level: Not on file  Occupational History  . Occupation: Consulting civil engineer  Social Needs  . Financial resource strain: Not on file  . Food insecurity:    Worry: Not on file    Inability: Not on file  . Transportation needs:    Medical: Not on file    Non-medical: Not on file  Tobacco Use  . Smoking  status: Never Smoker  . Smokeless tobacco: Never Used  Substance and Sexual Activity  . Alcohol use: Never    Frequency: Never  . Drug use: Never  . Sexual activity: Not Currently  Lifestyle  . Physical activity:    Days per week: Not on file    Minutes per session: Not on file  . Stress: Not on file  Relationships  . Social connections:    Talks on phone: Not on file    Gets together: Not on file    Attends religious service: Not on file    Active member of club or organization: Not on file    Attends meetings of clubs or organizations: Not on file    Relationship status: Not on file  . Intimate partner violence:    Fear of current or ex partner: Not on file    Emotionally abused: Not on file    Physically abused: Not on file    Forced  sexual activity: Not on file  Other Topics Concern  . Not on file  Social History Narrative   From [place].  Was receiving medical care at [facility].  Resides with mother.   Family History  Problem Relation Age of Onset  . Anxiety disorder Mother   . Depression Mother   . Hyperlipidemia Mother   . Bipolar disorder Mother   . Thyroid disease Maternal Grandmother   . Deep vein thrombosis Maternal Grandmother   . Thyroid disease Maternal Grandfather   . Anxiety disorder Father   . Depression Father   . Autism Sister   . Other Sister        [place] syndrome  . Factor V Leiden deficiency Maternal Aunt   . Breast cancer Maternal Aunt   . Bipolar disorder Maternal Aunt   . Diabetes Paternal Grandfather   . Deep vein thrombosis Maternal Aunt   . Bipolar disorder Maternal Aunt   . Colon cancer Neg Hx   . Esophageal cancer Neg Hx   . Rectal cancer Neg Hx     Objective: Office vital signs reviewed. BP 107/66   Pulse 88   Temp (!) 97 F (36.1 C) (Oral)   Ht 5\' 3"  (1.6 m)   Wt 152 lb (68.9 kg)   BMI 26.93 kg/m   Physical Examination:  General: Awake, alert, well nourished, No acute distress HEENT: MMM Cardio: regular rate and rhythm, S1S2 heard, no murmurs appreciated Pulm: clear to auscultation bilaterally, no wheezes, rhonchi or rales; normal work of breathing on room air  Assessment/ Plan: 22 y.o. female   1. Lupus anticoagulant disorder (HCC) INR supratherapeutic at 4.2 today.  She has not had any changes in diet.  Possibly related to temporary seizure medication change.  However, it has been several weeks since that change.  I have recommended that she hold today's Coumadin dose and resume normal dosing tomorrow.  I do think that this patient would be a good candidate for home INR monitoring.  I have completed the order for this and patient will come in to fill out her portion.  Hopefully we can get this set up in the next week to work around her frequent travels for the  holidays.  She will follow-up in 1 week to have INR rechecked.  2. History of DVT of lower extremity As above - CoaguChek XS/INR Waived  3. Chronic anticoagulation As above   Orders Placed This Encounter  Procedures  . CoaguChek XS/INR Waived   No orders of the defined  types were placed in this encounter.    Janora Norlander, DO [place] [phone]

## 2018-08-15 ENCOUNTER — Ambulatory Visit: Payer: 59 | Admitting: Family Medicine

## 2018-08-21 ENCOUNTER — Ambulatory Visit (HOSPITAL_COMMUNITY): Admission: RE | Admit: 2018-08-21 | Payer: 59 | Source: Ambulatory Visit

## 2018-08-22 ENCOUNTER — Ambulatory Visit (INDEPENDENT_AMBULATORY_CARE_PROVIDER_SITE_OTHER): Payer: 59 | Admitting: Family Medicine

## 2018-08-22 ENCOUNTER — Encounter: Payer: Self-pay | Admitting: Family Medicine

## 2018-08-22 VITALS — BP 108/69 | HR 84 | Temp 98.1°F | Ht 63.0 in | Wt 152.0 lb

## 2018-08-22 DIAGNOSIS — D6862 Lupus anticoagulant syndrome: Secondary | ICD-10-CM

## 2018-08-22 DIAGNOSIS — Z86718 Personal history of other venous thrombosis and embolism: Secondary | ICD-10-CM | POA: Diagnosis not present

## 2018-08-22 LAB — COAGUCHEK XS/INR WAIVED
INR: 2.3 — ABNORMAL HIGH (ref 0.9–1.1)
PROTHROMBIN TIME: 27.3 s

## 2018-08-22 MED ORDER — WARFARIN SODIUM 5 MG PO TABS: 5.0000 mg | ORAL_TABLET | Freq: Every day | ORAL | 1 refills | Status: DC

## 2018-08-22 NOTE — Patient Instructions (Signed)
Continue Coumadin 5mg  daily.  We will reach out to the INR home check people and see where they are at in the process.  See Marcelino DusterMichelle next week for recheck.

## 2018-08-22 NOTE — Progress Notes (Signed)
Subjective: CC: chronic anticoagulation PCP: Janora Norlander, DO Samantha Dougherty is a 22 y.o. female presenting to clinic today for:  1.  History of DVT/lupus anticoagulant Patient on chronic anticoagulation with Coumadin.  History of lupus anticoagulant and DVT.  Goal INR of 2-3.  At last visit 2 weeks ago, her INR was supratherapeutic.  She was told her Coumadin dose for 1 day and resume normal dosing the following day.  We had completed forms for home INR monitoring.  She reports today for recheck of INR, after missing last week's appointment.  She was unable to follow-up because of going out of town for the holiday.  She continues to have some metallic taste in her mouth which she thinks is intermittent bleeding of her gums.  No vaginal bleeding or rectal bleeding.  She has not heard about getting the home INR kit yet.   ROS: Per HPI  Allergies  Allergen Reactions  . Sulfa Antibiotics Anaphylaxis  . Amitriptyline     Hallucinations   Past Medical History:  Diagnosis Date  . Anxiety   . Asthma   . Bipolar 2 disorder ([place])   . Conversion disorder   . Depression   . DVT (deep venous thrombosis) (HCC)    LLE  . Fibromyalgia   . Gallbladder polyp   . H. pylori infection   . IBS (irritable bowel syndrome)   . Legal blindness    Right eye  . Migraine   . Mood disorder ([place])   . Psychogenic nonepileptic seizure   . Urinary tract infection     Current Outpatient Medications:  .  promethazine (PHENERGAN) 12.5 MG tablet, Take 1 tablet (12.5 mg total) by mouth every 12 (twelve) hours as needed for nausea or vomiting., Disp: 60 tablet, Rfl: 1 .  ALPRAZolam (XANAX) 0.5 MG tablet, Take 1 tablet (0.5 mg total) by mouth 3 (three) times daily as needed for anxiety., Disp: 90 tablet, Rfl: 1 .  Brexpiprazole (REXULTI) 0.5 MG TABS, Take 1 tablet (0.5 mg total) by mouth every evening., Disp: 90 tablet, Rfl: 0 .  escitalopram (LEXAPRO) 10 MG tablet, Take 1 tablet (10 mg total) by  mouth daily., Disp: 90 tablet, Rfl: 0 .  hydroxypropyl methylcellulose / hypromellose (ISOPTO TEARS / GONIOVISC) 2.5 % ophthalmic solution, Place 1 drop into both eyes 3 (three) times daily as needed for dry eyes., Disp: , Rfl:  .  hyoscyamine (LEVSIN, ANASPAZ) 0.125 MG tablet, Take 0.125 mg by mouth every 6 (six) hours as needed for bladder spasms or cramping. , Disp: , Rfl:  .  lamoTRIgine (LAMICTAL) 100 MG tablet, Take 1 tablet (100 mg total) by mouth 2 (two) times daily., Disp: 60 tablet, Rfl: 11 .  rifaximin (XIFAXAN) 550 MG TABS tablet, Take 1 tablet (550 mg total) by mouth 3 (three) times daily. (Patient not taking: Reported on 08/09/2018), Disp: 42 tablet, Rfl: 0 .  warfarin (COUMADIN) 7.5 MG tablet, Take 7.5 mg by mouth daily., Disp: , Rfl:  Social History   Socioeconomic History  . Marital status: Single    Spouse name: Not on file  . Number of children: 0  . Years of education: Not on file  . Highest education level: Not on file  Occupational History  . Occupation: Ship broker  Social Needs  . Financial resource strain: Not on file  . Food insecurity:    Worry: Not on file    Inability: Not on file  . Transportation needs:    Medical: Not  on file    Non-medical: Not on file  Tobacco Use  . Smoking status: Never Smoker  . Smokeless tobacco: Never Used  Substance and Sexual Activity  . Alcohol use: Never    Frequency: Never  . Drug use: Never  . Sexual activity: Not Currently  Lifestyle  . Physical activity:    Days per week: Not on file    Minutes per session: Not on file  . Stress: Not on file  Relationships  . Social connections:    Talks on phone: Not on file    Gets together: Not on file    Attends religious service: Not on file    Active member of club or organization: Not on file    Attends meetings of clubs or organizations: Not on file    Relationship status: Not on file  . Intimate partner violence:    Fear of current or ex partner: Not on file     Emotionally abused: Not on file    Physically abused: Not on file    Forced sexual activity: Not on file  Other Topics Concern  . Not on file  Social History Narrative   From [place].  Was receiving medical care at [facility].  Resides with mother.   Family History  Problem Relation Age of Onset  . Anxiety disorder Mother   . Depression Mother   . Hyperlipidemia Mother   . Bipolar disorder Mother   . Thyroid disease Maternal Grandmother   . Deep vein thrombosis Maternal Grandmother   . Thyroid disease Maternal Grandfather   . Anxiety disorder Father   . Depression Father   . Autism Sister   . Other Sister        [place] syndrome  . Factor V Leiden deficiency Maternal Aunt   . Breast cancer Maternal Aunt   . Bipolar disorder Maternal Aunt   . Diabetes Paternal Grandfather   . Deep vein thrombosis Maternal Aunt   . Bipolar disorder Maternal Aunt   . Colon cancer Neg Hx   . Esophageal cancer Neg Hx   . Rectal cancer Neg Hx     Objective: Office vital signs reviewed. There were no vitals taken for this visit.  Physical Examination:  General: Awake, alert, well nourished, No acute distress HEENT: MMM Cardio: regular rate and rhythm, S1S2 heard, no murmurs appreciated Pulm: clear to auscultation bilaterally, no wheezes, rhonchi or rales; normal work of breathing on room air  Assessment/ Plan: 22 y.o. female   1. Lupus anticoagulant disorder (HCC) INR therapeutic today at 2.3.  Continue Coumadin '5mg'$  daily.  Follow up in 1 week to recheck w/ Sharyn Lull.  Working on getting home INR checks.  2. History of DVT of lower extremity As above - CoaguChek XS/INR Waived   Orders Placed This Encounter  Procedures  . CoaguChek XS/INR Waived   No orders of the defined types were placed in this encounter.    Janora Norlander, DO [place] [phone]

## 2018-08-30 ENCOUNTER — Ambulatory Visit: Payer: 59 | Admitting: Family Medicine

## 2018-08-30 ENCOUNTER — Encounter: Payer: Self-pay | Admitting: Family Medicine

## 2018-08-30 VITALS — BP 110/63 | HR 85 | Temp 97.8°F | Ht 63.0 in | Wt 154.0 lb

## 2018-08-30 DIAGNOSIS — D6862 Lupus anticoagulant syndrome: Secondary | ICD-10-CM | POA: Diagnosis not present

## 2018-08-30 DIAGNOSIS — R6889 Other general symptoms and signs: Secondary | ICD-10-CM | POA: Diagnosis not present

## 2018-08-30 DIAGNOSIS — L858 Other specified epidermal thickening: Secondary | ICD-10-CM

## 2018-08-30 DIAGNOSIS — R5383 Other fatigue: Secondary | ICD-10-CM | POA: Diagnosis not present

## 2018-08-30 DIAGNOSIS — L700 Acne vulgaris: Secondary | ICD-10-CM | POA: Diagnosis not present

## 2018-08-30 LAB — COAGUCHEK XS/INR WAIVED
INR: 2.2 — AB (ref 0.9–1.1)
PROTHROMBIN TIME: 26.2 s

## 2018-08-30 LAB — BAYER DCA HB A1C WAIVED: HB A1C (BAYER DCA - WAIVED): 5.2 % (ref <7.0)

## 2018-08-30 NOTE — Patient Instructions (Signed)
Acne Plan  Products: Face Wash:  Use a gentle cleanser, such as Cetaphil (generic version of this is fine) Moisturizer:  Use an "oil-free" moisturizer with SPF  IF no improvement after 2 months of above, ADD Adalapene (differin gel) 2 days per week.  Morning: Wash face, then completely dry Apply Moisturizer to entire face  Bedtime: Wash face, then completely dry Apply moisturizer  Remember: - Your acne will probably get worse before it gets better - It takes at least 2 months for the medicines to start working - Use oil free soaps and lotions; these can be over the counter or store-brand - Don't use harsh scrubs or astringents, these can make skin irritation and acne worse - Moisturize daily with oil free lotion because the acne medicines will dry your skin  Call your doctor if you have: - Lots of skin dryness or redness that doesn't get better if you use a moisturizer or if you use the prescription cream or lotion every other day    Stop using the acne medicine immediately and see your doctor if you are or become pregnant or if you think you had an allergic reaction (itchy rash, difficulty breathing, nausea, vomiting) to your acne medication.

## 2018-08-30 NOTE — Progress Notes (Signed)
Subjective: CC: multiple concerns PCP: Samantha Ip, DO NWG:NFAO D. Beckmann is a 22 y.o. female presenting to clinic today for:  1. Chronic anticoagulation/ Lupus Anticoagulant Patient seen 2 weeks ago for chronic anticoagulation INR check.  At that time she was therapeutic.  She reports compliance with warfarin 5 mg daily.  She was approved for home INR checking machine today.  She has not been contacted for home visit yet.  Her hematology visit is scheduled for January at [facility].  2.  Arm rash Patient's mother reports an intermittent rash that appears on bilateral outsides of the arms and along the jaw.  No appreciable itching but her follicles look more prominent during flares.  3.  Acne Patient reports currently washing with soap and water.  No use of moisturization over-the-counter acne products.  Affected areas particularly on the face.  She wonders if this is hormone related.  4.  Fluctuating weight/yawning Mother reports fluctuating weight, citing that her abdomen will sometimes look like she is 8 months pregnant.  She is currently under the care of gastroenterology who is working up her symptoms.  They are doing stool studies.  She continues to have intermittent nausea and reduced appetite.  She often yawns.  Her mom is worried that this might have something to do with her hormones.  She also reports having heat and cold intolerance.  Her mother would like her thyroid checked.   ROS: Per HPI  Allergies  Allergen Reactions  . Sulfa Antibiotics Anaphylaxis  . Amitriptyline     Hallucinations   Past Medical History:  Diagnosis Date  . Anxiety   . Asthma   . Bipolar 2 disorder (HCC)   . Conversion disorder   . Depression   . DVT (deep venous thrombosis) (HCC)    LLE  . Fibromyalgia   . Gallbladder polyp   . H. pylori infection   . IBS (irritable bowel syndrome)   . Legal blindness    Right eye  . Migraine   . Mood disorder (HCC)   . Psychogenic nonepileptic  seizure   . Urinary tract infection     Current Outpatient Medications:  .  ALPRAZolam (XANAX) 0.5 MG tablet, Take 1 tablet (0.5 mg total) by mouth 3 (three) times daily as needed for anxiety., Disp: 90 tablet, Rfl: 1 .  Brexpiprazole (REXULTI) 0.5 MG TABS, Take 1 tablet (0.5 mg total) by mouth every evening., Disp: 90 tablet, Rfl: 0 .  escitalopram (LEXAPRO) 10 MG tablet, Take 1 tablet (10 mg total) by mouth daily., Disp: 90 tablet, Rfl: 0 .  hydroxypropyl methylcellulose / hypromellose (ISOPTO TEARS / GONIOVISC) 2.5 % ophthalmic solution, Place 1 drop into both eyes 3 (three) times daily as needed for dry eyes., Disp: , Rfl:  .  hyoscyamine (LEVSIN, ANASPAZ) 0.125 MG tablet, Take 0.125 mg by mouth every 6 (six) hours as needed for bladder spasms or cramping. , Disp: , Rfl:  .  lamoTRIgine (LAMICTAL) 100 MG tablet, Take 1 tablet (100 mg total) by mouth 2 (two) times daily., Disp: 60 tablet, Rfl: 11 .  promethazine (PHENERGAN) 12.5 MG tablet, Take 1 tablet (12.5 mg total) by mouth every 12 (twelve) hours as needed for nausea or vomiting., Disp: 60 tablet, Rfl: 1 .  warfarin (COUMADIN) 5 MG tablet, Take 1 tablet (5 mg total) by mouth daily., Disp: 90 tablet, Rfl: 1 Social History   Socioeconomic History  . Marital status: Single    Spouse name: Not on file  .  Number of children: 0  . Years of education: Not on file  . Highest education level: Not on file  Occupational History  . Occupation: Consulting civil engineer  Social Needs  . Financial resource strain: Not on file  . Food insecurity:    Worry: Not on file    Inability: Not on file  . Transportation needs:    Medical: Not on file    Non-medical: Not on file  Tobacco Use  . Smoking status: Never Smoker  . Smokeless tobacco: Never Used  Substance and Sexual Activity  . Alcohol use: Never    Frequency: Never  . Drug use: Never  . Sexual activity: Not Currently  Lifestyle  . Physical activity:    Days per week: Not on file    Minutes per  session: Not on file  . Stress: Not on file  Relationships  . Social connections:    Talks on phone: Not on file    Gets together: Not on file    Attends religious service: Not on file    Active member of club or organization: Not on file    Attends meetings of clubs or organizations: Not on file    Relationship status: Not on file  . Intimate partner violence:    Fear of current or ex partner: Not on file    Emotionally abused: Not on file    Physically abused: Not on file    Forced sexual activity: Not on file  Other Topics Concern  . Not on file  Social History Narrative   From [place].  Was receiving medical care at [facility].  Resides with mother.   Family History  Problem Relation Age of Onset  . Anxiety disorder Mother   . Depression Mother   . Hyperlipidemia Mother   . Bipolar disorder Mother   . Thyroid disease Maternal Grandmother   . Deep vein thrombosis Maternal Grandmother   . Thyroid disease Maternal Grandfather   . Anxiety disorder Father   . Depression Father   . Autism Sister   . Other Sister        [place] syndrome  . Factor V Leiden deficiency Maternal Aunt   . Breast cancer Maternal Aunt   . Bipolar disorder Maternal Aunt   . Diabetes Paternal Grandfather   . Deep vein thrombosis Maternal Aunt   . Bipolar disorder Maternal Aunt   . Colon cancer Neg Hx   . Esophageal cancer Neg Hx   . Rectal cancer Neg Hx     Objective: Office vital signs reviewed. BP 110/63   Pulse 85   Temp 97.8 F (36.6 C) (Oral)   Ht 5\' 3"  (1.6 m)   Wt 154 lb (69.9 kg)   BMI 27.28 kg/m   Physical Examination:  General: Awake, alert, well nourished, No acute distress HEENT: Normal, sclera white, moist mucous membranes, no exophthalmos.  No appreciable goiters. Extremities: warm, well perfused, No edema, cyanosis or clubbing; +2 pulses bilaterally Skin: dry; intact; keratosis pilaris noted along bilateral upper arms.  She has mixed open and closed comedones along the forehead  and face.  Assessment/ Plan: 22 y.o. female   1. Lupus anticoagulant disorder (HCC) INR is therapeutic.  Continue warfarin at 5 mg daily.  Home set up for home INR checks should be in place soon. - CoaguChek XS/INR Waived  2. Fluctuation of weight She is gained about 2 pounds since last visit.  Of note, Thanksgiving holiday has just passed.  Her weight has been stable  the 2 prior office visits.  We discussed physical activity.  Abdominal symptoms are being worked up by gastroenterology at this time.  We will check thyroid function to rule out endocrine etiology. - TSH  3. Fatigue, unspecified type Check thyroid - Bayer DCA Hb A1c Waived - TSH  4. Acne vulgaris Home skin care was reviewed with the patient.  Recommended starting a product with salicylic acid and applying moisturizer following face washes.  If symptoms do not improve over the next 2 months, can consider adding Differin gel.  We discussed that this may cause skin irritation and should be used sparingly.  5. Keratosis pilaris Benign.  Handout provided.   Orders Placed This Encounter  Procedures  . CoaguChek XS/INR Waived  . Bayer DCA Hb A1c Waived  . TSH   No orders of the defined types were placed in this encounter.    Samantha IpAshly M Paola Aleshire, DO [facility] [phone]

## 2018-08-31 ENCOUNTER — Telehealth: Payer: Self-pay | Admitting: Family Medicine

## 2018-08-31 LAB — TSH: TSH: 0.737 u[IU]/mL (ref 0.450–4.500)

## 2018-08-31 NOTE — Telephone Encounter (Signed)
Pt's mother advised of MD feedback and voiced understanding.

## 2018-08-31 NOTE — Telephone Encounter (Signed)
Combining benzodiazepines would not be recommended as this could cause her to stop breathing.  If she is having recurrent seizure activity, she needs to be seen ASAP by Neuro or go immediately to hospital.  She may need admission if they recur.

## 2018-08-31 NOTE — Telephone Encounter (Signed)
PT mom has called stating that they are just leaving [facility] due to pt having seizures. Mom state that pt has told her that Ativan has helped with her seizures in past. Mom is wanting to know if we can send in some 1MG  tablets of Ativan to pharmacy.  Pt mother had to call EMS around 430 this morning due to continuous seizures that was going on for close to an hour.    These seizures normally gets bad when she has her periods.   Doesn't see neurologist until Jan, the ER would not give any "rescue" medication to help with stop the seizures.   Mom would like to have a call back today  If you can't reach mom on her cell phone, please call their home number [phone], sometimes the cell phone doesn't pick up in the house.    [facility]

## 2018-08-31 NOTE — Telephone Encounter (Signed)
Pt's mother states daughter starts having seizures when her menses start for the past few months and wants Ativan so that she doesn't have seizures. Pt is on xanax TID from her psych doctor. Pt's mother is scared she will be back in the ED tonight without something. Is there anything you can do without an office visit?

## 2018-09-07 ENCOUNTER — Other Ambulatory Visit: Payer: 59 | Admitting: Adult Health

## 2018-09-11 ENCOUNTER — Encounter: Payer: Self-pay | Admitting: Family Medicine

## 2018-09-11 NOTE — Telephone Encounter (Signed)
Spoke with pt and she says they will send someone out to check her INR on the 27th. Please advise.

## 2018-09-15 ENCOUNTER — Telehealth (HOSPITAL_COMMUNITY): Payer: Self-pay

## 2018-09-15 NOTE — Telephone Encounter (Signed)
Medication management - Telephone call with pt's Mother to follow up on message they left pt is almost out of medications and will need refills upon their return from trip to [place]. Informed by record pt should have refills and enough medications until next appointment.  Pt's Mother reported [facility] [place] and they did not have Rexult or Alprazolam orders.  Agreed to call pt's pharmacy to follow-up.  [facility] and [facility] verified they had a new Alprazolam 0.5 mg order and Rexulti order ready for patient to pick up.  Agreed to call to inform patient. Called and left a message for patient to inform both [facility] and ready to be picked up upon her return from trip.  Requested patient call back if any more problems with needed refills.

## 2018-09-18 ENCOUNTER — Telehealth: Payer: Self-pay

## 2018-09-18 NOTE — Telephone Encounter (Signed)
09/16/18  INR 2.2   Range 2-3

## 2018-09-18 NOTE — Telephone Encounter (Signed)
Excellent.  Ok to continue current regimen

## 2018-09-22 NOTE — Telephone Encounter (Signed)
LMOVM. continue current dose

## 2018-09-25 ENCOUNTER — Telehealth: Payer: Self-pay | Admitting: *Deleted

## 2018-09-25 NOTE — Telephone Encounter (Signed)
Fax received [employer] PT/INR self testing service Test date/time 09/23/18 10:43 am INR 2.1

## 2018-09-25 NOTE — Telephone Encounter (Signed)
LMTCB

## 2018-09-26 ENCOUNTER — Ambulatory Visit (HOSPITAL_COMMUNITY): Payer: Self-pay | Admitting: Psychiatry

## 2018-10-02 ENCOUNTER — Telehealth: Payer: Self-pay | Admitting: *Deleted

## 2018-10-02 ENCOUNTER — Other Ambulatory Visit: Payer: Self-pay | Admitting: Physician Assistant

## 2018-10-02 NOTE — Telephone Encounter (Signed)
She is subtherapeutic.  If patient has not missed any medication, I want her to increase her dose to 1.5 tablets (7.5mg ) today and 5mg  tablets all other days and recheck in 1 week.

## 2018-10-02 NOTE — Progress Notes (Signed)
Patient aware and verbalizes understanding. 

## 2018-10-02 NOTE — Telephone Encounter (Signed)
Fax received [employer] PT/INR self testing service Test date/time 09/30/18 2:04 pm INR 1.3

## 2018-10-02 NOTE — Telephone Encounter (Signed)
Left message to please call our office. 

## 2018-10-02 NOTE — Progress Notes (Signed)
09/30/2018  INR today 1.3 (at goal) Left message on her cell phone om 10/01/2018 to call me back and that it was about her INR being low. Increase dose to 7.5 mg on Monday, Wednesday, Friday, Saturday and 5 mg on all other days.  Recheck 1 weeks.  Goal- INR 2-3

## 2018-10-03 NOTE — Telephone Encounter (Signed)
Refer to previous phone note 

## 2018-10-04 NOTE — Telephone Encounter (Signed)
Pt has multiple calls open, will close this encounter.

## 2018-10-07 NOTE — Progress Notes (Signed)
BH MD/PA/NP OP Progress Note  10/09/2018 12:02 PM Samantha Dougherty  MRN:  [id number]  Chief Complaint:  Chief Complaint    Depression; Follow-up     HPI:  -Per chart review, the patient was seen by GI; differential includes functional dyspepsia with pending further evaluation. TCA, buspirone, mirtazapine was recommended to consider.  - She was referred to epilepsy clinic. Normal neurologic examination per chart review  Patient presents for follow-up appointment for depression.  She states that she has been feeling frustrated due to her medical condition.  She has seizure-like episode almost every day.  She occasionally loses her consciousness and does not recall what happened.  Although she sometimes feels stressed, it can happen when she is staying in the bed.  She has myalgia; she occasionally finds it difficult to get out of the bed.  She now needs help to take a shower or cook food.  She felt that she needed to force herself to come to the appointment today.  She wishes to go to college and have a job if she were to have "pain free." She agrees that she tries to think options she can take even with pain. She tries to think positive. She notices that she tends to be more anxious when she is by herself. She enjoys being with her friends, who she met at church. She went to [place] on holidays, and enjoyed time with her grandparent.  She is planning to have evaluation for epilepsy at [facility].  She has fair sleep.  She denies feeling depressed.  She has fair motivation.  She has low energy.  She denies SI.  She feels anxious and tense at times.  She denies panic attacks.   Xanax filled on 09/11/2018   Visit Diagnosis:    ICD-10-CM   1. MDD (major depressive disorder), recurrent episode, mild ([place]) F33.0     Past Psychiatric History: Please see initial evaluation for full details. I have reviewed the history. No updates at this time.     Past Medical History:  Past Medical History:  Diagnosis  Date  . Anxiety   . Asthma   . Bipolar 2 disorder ([place])   . Conversion disorder   . Depression   . DVT (deep venous thrombosis) (H[facility])    LLE  . Fibromyalgia   . Gallbladder polyp   . H. pylori infection   . IBS (irritable bowel syndrome)   . Legal blindness    Right eye  . Migraine   . Mood disorder ([place])   . Psychogenic nonepileptic seizure   . Urinary tract infection     Past Surgical History:  Procedure Laterality Date  . COLONOSCOPY WITH ESOPHAGOGASTRODUODENOSCOPY (EGD)    . HYMENECTOMY  2016    Family Psychiatric History: Please see initial evaluation for full details. I have reviewed the history. No updates at this time.     Family History:  Family History  Problem Relation Age of Onset  . Anxiety disorder Mother   . Depression Mother   . Hyperlipidemia Mother   . Bipolar disorder Mother   . Thyroid disease Maternal Grandmother   . Deep vein thrombosis Maternal Grandmother   . Thyroid disease Maternal Grandfather   . Anxiety disorder Father   . Depression Father   . Autism Sister   . Other Sister        [place] syndrome  . Factor V Leiden deficiency Maternal Aunt   . Breast cancer Maternal Aunt   . Bipolar  disorder Maternal Aunt   . Diabetes Paternal Grandfather   . Deep vein thrombosis Maternal Aunt   . Bipolar disorder Maternal Aunt   . Colon cancer Neg Hx   . Esophageal cancer Neg Hx   . Rectal cancer Neg Hx     Social History:  Social History   Socioeconomic History  . Marital status: Single    Spouse name: Not on file  . Number of children: 0  . Years of education: Not on file  . Highest education level: Not on file  Occupational History  . Occupation: Ship broker  Social Needs  . Financial resource strain: Not on file  . Food insecurity:    Worry: Not on file    Inability: Not on file  . Transportation needs:    Medical: Not on file    Non-medical: Not on file  Tobacco Use  . Smoking status: Never Smoker  . Smokeless tobacco: Never  Used  Substance and Sexual Activity  . Alcohol use: Never    Frequency: Never  . Drug use: Never  . Sexual activity: Not Currently  Lifestyle  . Physical activity:    Days per week: Not on file    Minutes per session: Not on file  . Stress: Not on file  Relationships  . Social connections:    Talks on phone: Not on file    Gets together: Not on file    Attends religious service: Not on file    Active member of club or organization: Not on file    Attends meetings of clubs or organizations: Not on file    Relationship status: Not on file  Other Topics Concern  . Not on file  Social History Narrative   From [place].  Was receiving medical care at [facility].  Resides with mother.    Allergies:  Allergies  Allergen Reactions  . Sulfa Antibiotics Anaphylaxis  . Amitriptyline     Hallucinations    Metabolic Disorder Labs: Lab Results  Component Value Date   HGBA1C 5.2 08/30/2018   No results found for: PROLACTIN Lab Results  Component Value Date   CHOL 171 04/14/2018   TRIG 140 04/14/2018   HDL 50 04/14/2018   CHOLHDL 3.4 04/14/2018   LDLCALC 93 04/14/2018   Lab Results  Component Value Date   TSH 0.737 08/30/2018    Therapeutic Level Labs: No results found for: LITHIUM No results found for: VALPROATE No components found for:  CBMZ  Current Medications: Current Outpatient Medications  Medication Sig Dispense Refill  . ALPRAZolam (XANAX) 0.5 MG tablet Take 1 tablet (0.5 mg total) by mouth 3 (three) times daily as needed for anxiety. 90 tablet 2  . Brexpiprazole (REXULTI) 0.5 MG TABS Take 1 tablet (0.5 mg total) by mouth every evening. 90 tablet 0  . escitalopram (LEXAPRO) 10 MG tablet Take 1 tablet (10 mg total) by mouth daily. 90 tablet 0  . hydroxypropyl methylcellulose / hypromellose (ISOPTO TEARS / GONIOVISC) 2.5 % ophthalmic solution Place 1 drop into both eyes 3 (three) times daily as needed for dry eyes.    . hyoscyamine (LEVSIN, ANASPAZ) 0.125 MG tablet  Take 0.125 mg by mouth every 6 (six) hours as needed for bladder spasms or cramping.     . lamoTRIgine (LAMICTAL) 100 MG tablet Take 1 tablet (100 mg total) by mouth 2 (two) times daily. 60 tablet 11  . promethazine (PHENERGAN) 12.5 MG tablet Take 1 tablet (12.5 mg total) by mouth every 12 (twelve) hours  as needed for nausea or vomiting. 60 tablet 1  . warfarin (COUMADIN) 5 MG tablet Take 1 tablet (5 mg total) by mouth daily. 90 tablet 1   No current facility-administered medications for this visit.      Musculoskeletal: Strength & Muscle Tone: within normal limits Gait & Station: normal Patient leans: N/A  Psychiatric Specialty Exam: Review of Systems  Psychiatric/Behavioral: Negative for depression, hallucinations, memory loss, substance abuse and suicidal ideas. The patient is nervous/anxious. The patient does not have insomnia.   All other systems reviewed and are negative.   Blood pressure 96/68, pulse 83, height _0  (1.6 m), weight 149 lb (67.6 kg), peak flow 96 L/min.Body mass index is 26.39 kg/m.  General Appearance: Fairly Groomed  Eye Contact:  Good  Speech:  Clear and Coherent  Volume:  Normal  Mood:  Anxious  Affect:  Appropriate, Congruent and Full Range  Thought Process:  Coherent  Orientation:  Full (Time, Place, and Person)  Thought Content: Logical   Suicidal Thoughts:  No  Homicidal Thoughts:  No  Memory:  Immediate;   Good  Judgement:  Good  Insight:  Fair  Psychomotor Activity:  Normal  Concentration:  Concentration: Good and Attention Span: Good  Recall:  Good  Fund of Knowledge: Good  Language: Good  Akathisia:  No  Handed:  Right  AIMS (if indicated): not done  Assets:  Communication Skills Desire for Improvement  ADL's:  Intact  Cognition: WNL  Sleep:  Good   Screenings: PHQ2-9     Office Visit from 08/30/2018 in [place] Office Visit from 08/22/2018 in [place] Office Visit from 07/17/2018  in [place] Office Visit from 04/14/2018 in [place]  PHQ-2 Total Score  _1 PHQ-9 Total Score  _2 -       Assessment and Plan:  Tama High is a 23 y.o. 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  Psychosocial stressors including relocation from [place], seizure-like episodes and myalgia.  Will continue Lexapro to target depression.  Will continue rexulti as adjunctive treatment for depression.  Discussed potential metabolic side effect.  Will continue Xanax as needed for anxiety.  Discussed risk of dependence and oversedation.  Although she will greatly benefit from ACT, she is not interested in this option.   # r/o fibromyalgia The patient has some features of fibromyalgia.  Although she will greatly benefit from a graded exercise, she declines the referral.   Plan  I have reviewed and updated plans as below 1. Continue Lexapro 10 mg daily  2. Continue Rexulti 0.5 mg daily  3. Continue Xanax 0.5 mg three times a day for anxiety 4. Continue lamotrigine 100 mg twice a day (prescribed by neurologist) 5.Return to clinic in three month for 30 mins  6. Discuss with your mother about referral for Graded exercise (physical therapy) - obtain record from her prior psychiatrist   Past trials of medication:Lexapro, duloxetine (headache), elavil (hallucinations), rexulti, Abilify, Xanax, lamotrigine, temazepam  The patient demonstrates the following risk factors for suicide: Chronic risk factors  for suicide  include:psychiatric disorder ofdepressionand chronic pain. Acute risk factorsfor suicide include: unemployment and social withdrawal/isolation. Protective factorsfor this patient include: positive social support, coping skills and hope for the future. Considering these factors, the overall suicide risk at this point appears to below. Patientisappropriate for outpatient follow up.  The duration of this appointment visit was 30 minutes of face-to-face time with the patient.  Greater than 50% of this time was spent in counseling, explanation of  diagnosis, planning of further management, and coordination of care.  Norman [place], MD 10/09/2018, 12:02 PM

## 2018-10-08 ENCOUNTER — Encounter: Payer: Self-pay | Admitting: Family Medicine

## 2018-10-09 ENCOUNTER — Telehealth: Payer: Self-pay | Admitting: *Deleted

## 2018-10-09 ENCOUNTER — Encounter (HOSPITAL_COMMUNITY): Payer: Self-pay | Admitting: Psychiatry

## 2018-10-09 ENCOUNTER — Ambulatory Visit (INDEPENDENT_AMBULATORY_CARE_PROVIDER_SITE_OTHER): Payer: 59 | Admitting: Psychiatry

## 2018-10-09 VITALS — BP 96/68 | HR 83 | Ht 63.0 in | Wt 149.0 lb

## 2018-10-09 DIAGNOSIS — F330 Major depressive disorder, recurrent, mild: Secondary | ICD-10-CM | POA: Diagnosis not present

## 2018-10-09 MED ORDER — ESCITALOPRAM OXALATE 10 MG PO TABS: 10.0000 mg | ORAL_TABLET | Freq: Every day | ORAL | 0 refills | Status: DC

## 2018-10-09 MED ORDER — ALPRAZOLAM 0.5 MG PO TABS: 0.5000 mg | ORAL_TABLET | Freq: Three times a day (TID) | ORAL | 2 refills | Status: DC | PRN

## 2018-10-09 MED ORDER — BREXPIPRAZOLE 0.5 MG PO TABS: 0.5000 mg | ORAL_TABLET | Freq: Every evening | ORAL | 0 refills | Status: DC

## 2018-10-09 NOTE — Telephone Encounter (Signed)
Have her skip today's coumadin dose and resume Coumadin 5mg  daily tomorrow. Recheck in 1 week.

## 2018-10-09 NOTE — Patient Instructions (Signed)
1. Continue Lexapro 10 mg daily  2. Continue Rexulti 0.5 mg daily  3. Continue Xanax 0.5 mg three times a day for anxiety 4. Continue lamotrigine 100 mg twice a day 5.Return to clinic in three month for 30 mins

## 2018-10-09 NOTE — Telephone Encounter (Signed)
Fax received [employer] PT/INR self testing service Test date/time 10/08/18 3:24 pm INR 4.1  Pt comments on fax: 'I used 2 strips because I didn't know if the first result was accurate due to the big change in my INR number.'

## 2018-10-11 ENCOUNTER — Encounter: Payer: Self-pay | Admitting: Family Medicine

## 2018-10-12 ENCOUNTER — Telehealth: Payer: Self-pay | Admitting: Family Medicine

## 2018-10-12 ENCOUNTER — Ambulatory Visit: Payer: 59 | Admitting: Family Medicine

## 2018-10-15 ENCOUNTER — Other Ambulatory Visit: Payer: Self-pay

## 2018-10-15 ENCOUNTER — Emergency Department (HOSPITAL_COMMUNITY): Payer: 59

## 2018-10-15 ENCOUNTER — Emergency Department (HOSPITAL_COMMUNITY)
Admission: EM | Admit: 2018-10-15 | Discharge: 2018-10-15 | Disposition: A | Discharge status: Home / Self Care | Payer: 59 | Attending: Emergency Medicine | Admitting: Emergency Medicine

## 2018-10-15 ENCOUNTER — Encounter (HOSPITAL_COMMUNITY): Payer: Self-pay | Admitting: Emergency Medicine

## 2018-10-15 DIAGNOSIS — R51 Headache: Secondary | ICD-10-CM | POA: Insufficient documentation

## 2018-10-15 DIAGNOSIS — Z79899 Other long term (current) drug therapy: Secondary | ICD-10-CM | POA: Diagnosis not present

## 2018-10-15 DIAGNOSIS — S39012A Strain of muscle, fascia and tendon of lower back, initial encounter: Secondary | ICD-10-CM | POA: Insufficient documentation

## 2018-10-15 DIAGNOSIS — Y929 Unspecified place or not applicable: Secondary | ICD-10-CM | POA: Insufficient documentation

## 2018-10-15 DIAGNOSIS — J45909 Unspecified asthma, uncomplicated: Secondary | ICD-10-CM | POA: Diagnosis not present

## 2018-10-15 DIAGNOSIS — X58XXXA Exposure to other specified factors, initial encounter: Secondary | ICD-10-CM | POA: Insufficient documentation

## 2018-10-15 DIAGNOSIS — M25551 Pain in right hip: Secondary | ICD-10-CM

## 2018-10-15 DIAGNOSIS — Y939 Activity, unspecified: Secondary | ICD-10-CM | POA: Insufficient documentation

## 2018-10-15 DIAGNOSIS — Y999 Unspecified external cause status: Secondary | ICD-10-CM | POA: Insufficient documentation

## 2018-10-15 DIAGNOSIS — Z7901 Long term (current) use of anticoagulants: Secondary | ICD-10-CM | POA: Insufficient documentation

## 2018-10-15 DIAGNOSIS — S3992XA Unspecified injury of lower back, initial encounter: Secondary | ICD-10-CM | POA: Diagnosis present

## 2018-10-15 HISTORY — DX: Antiphospholipid syndrome: D68.61

## 2018-10-15 LAB — CBC
HCT: 41.3 % (ref 36.0–46.0)
Hemoglobin: 13.1 g/dL (ref 12.0–15.0)
MCH: 28.5 pg (ref 26.0–34.0)
MCHC: 31.7 g/dL (ref 30.0–36.0)
MCV: 89.8 fL (ref 80.0–100.0)
NRBC: 0 % (ref 0.0–0.2)
PLATELETS: 296 10*3/uL (ref 150–400)
RBC: 4.6 MIL/uL (ref 3.87–5.11)
RDW: 12.4 % (ref 11.5–15.5)
WBC: 8.8 10*3/uL (ref 4.0–10.5)

## 2018-10-15 LAB — BASIC METABOLIC PANEL
ANION GAP: 8 (ref 5–15)
BUN: 7 mg/dL (ref 6–20)
CALCIUM: 9.6 mg/dL (ref 8.9–10.3)
CO2: 26 mmol/L (ref 22–32)
CREATININE: 0.7 mg/dL (ref 0.44–1.00)
Chloride: 104 mmol/L (ref 98–111)
GFR calc non Af Amer: >60 mL/min (ref >60)
Glucose, Bld: 90 mg/dL (ref 70–99)
POTASSIUM: 3.7 mmol/L (ref 3.5–5.1)
Sodium: 138 mmol/L (ref 135–145)

## 2018-10-15 LAB — PROTIME-INR
INR: 2.82
Prothrombin Time: 29.3 seconds — ABNORMAL HIGH (ref 11.4–15.2)

## 2018-10-15 LAB — I-STAT BETA HCG BLOOD, ED (MC, WL, AP ONLY): I-stat hCG, quantitative: <5 m[IU]/mL (ref <5)

## 2018-10-15 MED ORDER — IOPAMIDOL (ISOVUE-300) INJECTION 61%
100.0000 mL | Freq: Once | INTRAVENOUS | Status: AC | PRN
  Administered 2018-10-15: 100 mL via INTRAVENOUS

## 2018-10-15 MED ORDER — SODIUM CHLORIDE 0.9 % IV SOLN
INTRAVENOUS | Status: DC
  Administered 2018-10-15: 21:00:00 via INTRAVENOUS

## 2018-10-15 MED ORDER — LAMOTRIGINE 25 MG PO TABS
100.0000 mg | ORAL_TABLET | Freq: Once | ORAL | Status: AC
  Administered 2018-10-15: 100 mg via ORAL
  Filled 2018-10-15: qty 4

## 2018-10-15 NOTE — ED Notes (Signed)
Patient back from CT scan.

## 2018-10-15 NOTE — ED Notes (Signed)
Patient transported to CT 
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
## 2018-10-15 NOTE — Discharge Instructions (Addendum)
CT scan head neck chest abdomen pelvis without any acute abnormalities.  X-rays of the right leg and right thigh without any bony injuries.  Expect to be sore and stiff for the next few days.  Your Coumadin level was therapeutic.  Make an appointment to follow-up with your regular doctor.
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
## 2018-10-16 NOTE — Telephone Encounter (Signed)
Patient had another INR done yesterday. Note filed.

## 2018-10-16 NOTE — Telephone Encounter (Signed)
Another INR was done yesterday. Note filed.

## 2018-10-19 ENCOUNTER — Encounter: Payer: Self-pay | Admitting: Adult Health

## 2018-10-19 ENCOUNTER — Other Ambulatory Visit (HOSPITAL_COMMUNITY)
Admission: RE | Admit: 2018-10-19 | Discharge: 2018-10-19 | Disposition: A | Discharge status: Home / Self Care | Payer: 59 | Source: Ambulatory Visit | Attending: Obstetrics and Gynecology | Admitting: Obstetrics and Gynecology

## 2018-10-19 ENCOUNTER — Ambulatory Visit (INDEPENDENT_AMBULATORY_CARE_PROVIDER_SITE_OTHER): Payer: 59 | Admitting: Adult Health

## 2018-10-19 VITALS — BP 106/65 | HR 72 | Ht 63.25 in | Wt 149.0 lb

## 2018-10-19 DIAGNOSIS — N83202 Unspecified ovarian cyst, left side: Secondary | ICD-10-CM

## 2018-10-19 DIAGNOSIS — N921 Excessive and frequent menstruation with irregular cycle: Secondary | ICD-10-CM | POA: Diagnosis not present

## 2018-10-19 DIAGNOSIS — Z01419 Encounter for gynecological examination (general) (routine) without abnormal findings: Secondary | ICD-10-CM | POA: Insufficient documentation

## 2018-10-19 DIAGNOSIS — Z7689 Persons encountering health services in other specified circumstances: Secondary | ICD-10-CM | POA: Diagnosis not present

## 2018-10-19 DIAGNOSIS — Z86718 Personal history of other venous thrombosis and embolism: Secondary | ICD-10-CM | POA: Diagnosis not present

## 2018-10-19 DIAGNOSIS — N83201 Unspecified ovarian cyst, right side: Secondary | ICD-10-CM | POA: Insufficient documentation

## 2018-10-19 DIAGNOSIS — N946 Dysmenorrhea, unspecified: Secondary | ICD-10-CM | POA: Insufficient documentation

## 2018-10-19 LAB — POCT URINE PREGNANCY: Preg Test, Ur: NEGATIVE

## 2018-10-19 MED ORDER — NORETHINDRONE 0.35 MG PO TABS: 1.0000 | ORAL_TABLET | Freq: Every day | ORAL | 11 refills | Status: DC

## 2018-10-19 NOTE — Progress Notes (Signed)
Patient ID: [name], female   DOB: 02/15/1996, 23 y.o.   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  PCP is Dr Nadine Counts.    Current Medications, Allergies, Past Medical History, Past Surgical History, Family History and Social History were reviewed in [employer] record.     Review of Systems: Patient denies any hearing loss, fatigue, blurred vision, shortness of breath, chest pain, problems with bowel movements, urination, or intercourse(never had sex). No joint pain or mood swings. Has migraines, and has nausea and vomiting with periods, and ?seizure activity before period, has sen neurologist and is on Lamictal. She started at age 58, and periods are irregular and can be heavy having to change pads every 1-2 hours. Has cramps and clots.  She is on coumadin has antiphospholipid antibodies, and hisotur of DVT behind knee. She has bilateral ovarian cyst on CT 10/15/2018.   Physical Exam:BP 106/65 (BP Location: Left Arm, Patient Position: Sitting, Cuff Size: Normal)   Pulse 72   Ht 5' 3.25" (1.607 m)   Wt 149 lb (67.6 kg)   LMP 10/04/2018   BMI 26.19 kg/m UPT negative.  General:  Well developed, well nourished, no acute distress Skin:  Warm and dry Neck:  Midline trachea, normal thyroid, good ROM, no lymphadenopathy Lungs; Clear to auscultation bilaterally Breast:  No dominant palpable mass, retraction, or nipple discharge Cardiovascular: Regular rate and rhythm Abdomen:  Soft, non tender, no hepatosplenomegaly Pelvic:  External genitalia is normal in appearance, no lesions.  The vagina is normal in appearance. Urethra has no lesions or masses. The cervix is nulliparous, pap with GC/CHL and reflex HPV performed. Uterus is felt to be normal size, shape, and contour.  No adnexal masses or tenderness  noted.Bladder is non tender, no masses felt. Extremities/musculoskeletal:  No swelling or varicosities noted, no clubbing or cyanosis Psych:  No mood changes, alert and cooperative,seems happy PHQ 9 score 15, is on meds, denies being suicidal and sees Dr Vanetta Shawl.  Fall risk is low. Examination chaperoned by Malachy Mood LPN. Discussed with Dr Emelda Fear, will try POP. Discussed with pt and her mom, options of POP, IUD or depo provera to control periods and suppress ovulation, and will start with POP, and will get back for F/U US in about 10 weeks to see if cysts resolved.   Impression:  1. Encounter for gynecological examination with Papanicolaou smear of cervix   2. Menorrhagia with irregular cycle   3. Cysts of both ovaries   4. Dysmenorrhea   5. Encounter for menstrual regulation   6. History of DVT of lower extremity      Plan:  Meds ordered this encounter  Medications  . norethindrone (MICRONOR,CAMILA,ERRIN) 0.35 MG tablet    Sig: Take 1 tablet (0.35 mg total) by mouth daily.    Dispense:  1 Package    Refill:  11    Order Specific Question:   Supervising Provider    Answer:   Lazaro Arms [[id number]]  Start Micronor today If has sex use condoms F/U in 10 weeks for GYN [place] and see me Physical in 1 year Pap in 3 if normal

## 2018-10-23 ENCOUNTER — Encounter: Payer: Self-pay | Admitting: Family Medicine

## 2018-10-23 LAB — CYTOLOGY - PAP
Chlamydia: NEGATIVE
DIAGNOSIS: NEGATIVE
Neisseria Gonorrhea: NEGATIVE

## 2018-10-31 ENCOUNTER — Telehealth: Payer: Self-pay | Admitting: *Deleted

## 2018-10-31 NOTE — Telephone Encounter (Signed)
Incoming call from pt's mother stating pt had seizure last night and today is having numbness in R hand with continued R sided weakness Mother also states pt doesn't feel well Mother instructed to take pt to ER for evaluation

## 2018-11-03 ENCOUNTER — Telehealth: Payer: Self-pay | Admitting: *Deleted

## 2018-11-03 NOTE — Telephone Encounter (Signed)
Fax received [employer] PT/INR self testing service Test date/time 11/03/18 2:07 am INR 2.0  Pt commented: 2nd strip used because 1st strip did not have enough blood on it, wanted to make sure it was accurate

## 2018-11-03 NOTE — Telephone Encounter (Signed)
This is therapeutic. Continue current Coumadin regimen.

## 2018-11-07 NOTE — Telephone Encounter (Signed)
LMOVM result therapeutic and continue on current coumadin dose

## 2018-11-08 NOTE — Telephone Encounter (Signed)
Left message, numbers are in therapeutic range, continue same dose.

## 2018-11-09 ENCOUNTER — Other Ambulatory Visit: Payer: Self-pay | Admitting: Family Medicine

## 2018-11-09 MED ORDER — WARFARIN SODIUM 5 MG PO TABS: 5.0000 mg | ORAL_TABLET | Freq: Every day | ORAL | 0 refills | Status: DC

## 2018-11-09 NOTE — Telephone Encounter (Signed)
LMOVM refill sent to pharmacy 

## 2018-11-09 NOTE — Telephone Encounter (Signed)
What is the name of the medication? Warfrin  Have you contacted your pharmacy to request a refill? yes  Which pharmacy would you like this sent to? [facility]    Patient notified that their request is being sent to the clinical staff for review and that they should receive a call once it is complete. If they do not receive a call within 24 hours they can check with their pharmacy or our office.

## 2018-11-10 ENCOUNTER — Encounter: Payer: Self-pay | Admitting: Family Medicine

## 2018-11-10 ENCOUNTER — Telehealth: Payer: Self-pay

## 2018-11-10 NOTE — Telephone Encounter (Signed)
At goal. Continue current regimen. 

## 2018-11-10 NOTE — Telephone Encounter (Signed)
11/10/2018  INR 2.6  Range 2-3

## 2018-11-11 NOTE — Telephone Encounter (Signed)
Pt was left a detailed VM + also will call back with further questions

## 2018-11-14 ENCOUNTER — Encounter (HOSPITAL_COMMUNITY): Payer: Self-pay | Admitting: Psychiatry

## 2018-11-14 ENCOUNTER — Other Ambulatory Visit: Payer: 59

## 2018-11-14 DIAGNOSIS — K824 Cholesterolosis of gallbladder: Secondary | ICD-10-CM

## 2018-11-14 DIAGNOSIS — K580 Irritable bowel syndrome with diarrhea: Secondary | ICD-10-CM

## 2018-11-14 DIAGNOSIS — R1013 Epigastric pain: Secondary | ICD-10-CM

## 2018-11-14 DIAGNOSIS — K30 Functional dyspepsia: Secondary | ICD-10-CM

## 2018-11-14 DIAGNOSIS — R110 Nausea: Secondary | ICD-10-CM

## 2018-11-14 NOTE — Telephone Encounter (Signed)
This encounter was created in error - please disregard.

## 2018-11-16 LAB — HELICOBACTER PYLORI  SPECIAL ANTIGEN
MICRO NUMBER:: 239560
SPECIMEN QUALITY: ADEQUATE

## 2018-11-17 ENCOUNTER — Telehealth: Payer: Self-pay | Admitting: *Deleted

## 2018-11-17 NOTE — Telephone Encounter (Signed)
Pt aware by detailed VM / na

## 2018-11-17 NOTE — Telephone Encounter (Signed)
Fax received [employer] PT/INR self testing service Test date/time 11/17/18 9:25 am INR 3.1

## 2018-11-17 NOTE — Telephone Encounter (Signed)
If no bleeding, can continue current dosing and recheck in 1 week.

## 2018-11-27 ENCOUNTER — Telehealth: Payer: Self-pay | Admitting: *Deleted

## 2018-11-27 NOTE — Telephone Encounter (Signed)
INR therapeutic. Continue current regimen. Recheck in 1 week 

## 2018-11-27 NOTE — Telephone Encounter (Signed)
Aware of recommendations  

## 2018-11-27 NOTE — Telephone Encounter (Signed)
Fax received [facility] PT/INR self testing service Test date/time 11/24/18 7:09 pm INR 2.1

## 2018-12-04 ENCOUNTER — Telehealth: Payer: Self-pay

## 2018-12-04 NOTE — Telephone Encounter (Signed)
LM on patient's VM with instructions.

## 2018-12-04 NOTE — Telephone Encounter (Signed)
12/01/2018  INR 1.9  Range 2-3

## 2018-12-04 NOTE — Telephone Encounter (Signed)
Ok to continue current regimen.  She fluctuates quite a bit.  Recheck in 1week

## 2018-12-11 ENCOUNTER — Telehealth: Payer: Self-pay | Admitting: *Deleted

## 2018-12-11 NOTE — Telephone Encounter (Signed)
Increase to 7.5mg  today (1.5 tablets of 5mg ) and then resume taking 5mg  daily.  Recheck in 1 week.

## 2018-12-11 NOTE — Telephone Encounter (Signed)
Pt aware to increase dose today, then resume & rck in 1 wk

## 2018-12-11 NOTE — Telephone Encounter (Signed)
Fax received [employer] PT/INR self testing service Test date/time 12/09/18 10:27 am INR 1.8

## 2018-12-13 ENCOUNTER — Ambulatory Visit (INDEPENDENT_AMBULATORY_CARE_PROVIDER_SITE_OTHER): Payer: Medicare Other | Admitting: Adult Health

## 2018-12-13 ENCOUNTER — Telehealth: Payer: Self-pay | Admitting: *Deleted

## 2018-12-13 ENCOUNTER — Other Ambulatory Visit: Payer: Self-pay

## 2018-12-13 ENCOUNTER — Encounter: Payer: Self-pay | Admitting: Adult Health

## 2018-12-13 ENCOUNTER — Ambulatory Visit (INDEPENDENT_AMBULATORY_CARE_PROVIDER_SITE_OTHER): Payer: 59

## 2018-12-13 VITALS — BP 111/62 | HR 76 | Ht 63.0 in | Wt 146.0 lb

## 2018-12-13 DIAGNOSIS — N83201 Unspecified ovarian cyst, right side: Secondary | ICD-10-CM | POA: Diagnosis not present

## 2018-12-13 DIAGNOSIS — N83202 Unspecified ovarian cyst, left side: Secondary | ICD-10-CM

## 2018-12-13 DIAGNOSIS — R102 Pelvic and perineal pain unspecified side: Secondary | ICD-10-CM

## 2018-12-13 DIAGNOSIS — Z87898 Personal history of other specified conditions: Secondary | ICD-10-CM

## 2018-12-13 DIAGNOSIS — N946 Dysmenorrhea, unspecified: Secondary | ICD-10-CM

## 2018-12-13 DIAGNOSIS — N921 Excessive and frequent menstruation with irregular cycle: Secondary | ICD-10-CM

## 2018-12-13 DIAGNOSIS — N80129 Deep endometriosis of ovary, unspecified ovary: Secondary | ICD-10-CM

## 2018-12-13 DIAGNOSIS — N801 Endometriosis of ovary: Secondary | ICD-10-CM | POA: Diagnosis not present

## 2018-12-13 DIAGNOSIS — Z86718 Personal history of other venous thrombosis and embolism: Secondary | ICD-10-CM

## 2018-12-13 NOTE — Telephone Encounter (Signed)
Left message letting pt know no visitors or children at today's appt. Also, advised if pt has come in contact with someone or suspected of having Covid-19 or if she is experiencing symptoms herself, please call the office before coming to appt. JSY

## 2018-12-13 NOTE — Progress Notes (Signed)
PELVIC US TA/TV:homogeneous anteverted uterus,wnl,right ovary contains two complex cyst low level echoes (ground glass appearance) (#1) 3.1 x 2.4 x 2.3 cm,(#2) 2.5 x 2.6 x 2.5 cm,left ovary contains a  complex lesion w/mixed echogenicity 5.3 x 4.9 x 3.4 cm (? Endometrioma),no internal color flow,no free fluid,pelvic pain during ultrasound,ovaries appear mobile

## 2018-12-13 NOTE — Progress Notes (Signed)
Patient ID: Samantha Dougherty, female   DOB: 12/02/1995, 23 y.o.   MRN: [id number] History of Present Illness: Samantha Dougherty is a 23 year old white female in for GYN [place] for bilateral ovarian cysts seen on CT in January.She has pain, and she stopped POPs due to increased seizure activity she says.Has appt in may with Dr Baldo Daub.    Current Medications, Allergies, Past Medical History, Past Surgical History, Family History and Social History were reviewed in [employer] record.     Review of Systems: +pelvic pain +pain with periods +irregualr periods, heavy at times    Physical Exam:BP 111/62 (BP Location: Right Arm, Patient Position: Sitting, Cuff Size: Normal)   Pulse 76   Ht 5\' 3"  (1.6 m)   Wt 146 lb (66.2 kg)   LMP 12/08/2018   BMI 25.86 kg/m  General:  Well developed, well nourished, no acute distress Skin:  Warm and dry Psych:  No mood changes, alert and cooperative,seems happy Discussed [place] with pt and mom on speaker phone.US shows normal uterus and EEC 4.8 mm with endometrioma left ovary 5.3 x 4.9 x 3.4 cms and 2 on right that are slightly smaller.  Impression: 1. Endometrioma of ovary   2. Pelvic pain   3. History of DVT of lower extremity   4. Dysmenorrhea   5. Menorrhagia with irregular cycle   6. History of seizures       Plan: Need to pt to ask Dr Baldo Daub about use of POP, IUD or depo and her seizures Return in 1 week to discuss with Dr Emelda Fear Sign release of records for Dr Baldo Daub

## 2018-12-15 ENCOUNTER — Telehealth: Payer: Self-pay | Admitting: *Deleted

## 2018-12-15 NOTE — Telephone Encounter (Signed)
Fax received [employer] PT/INR self testing service Test date/time 12/15/18 11:23 am INR 2.3

## 2018-12-15 NOTE — Telephone Encounter (Signed)
INR is therapeutic.  Ok to continue 5mg  Coumadin daily. Recheck in 1week.

## 2018-12-15 NOTE — Telephone Encounter (Signed)
Aware to continue dose and recheck next week

## 2018-12-20 ENCOUNTER — Encounter: Payer: Self-pay | Admitting: Obstetrics and Gynecology

## 2018-12-20 ENCOUNTER — Ambulatory Visit (INDEPENDENT_AMBULATORY_CARE_PROVIDER_SITE_OTHER): Payer: 59 | Admitting: Obstetrics and Gynecology

## 2018-12-20 ENCOUNTER — Other Ambulatory Visit: Payer: Self-pay

## 2018-12-20 DIAGNOSIS — N80129 Deep endometriosis of ovary, unspecified ovary: Secondary | ICD-10-CM

## 2018-12-20 DIAGNOSIS — Z87898 Personal history of other specified conditions: Secondary | ICD-10-CM | POA: Diagnosis not present

## 2018-12-20 DIAGNOSIS — D6862 Lupus anticoagulant syndrome: Secondary | ICD-10-CM | POA: Diagnosis not present

## 2018-12-20 DIAGNOSIS — N801 Endometriosis of ovary: Secondary | ICD-10-CM

## 2018-12-20 NOTE — Progress Notes (Addendum)
Patient ID: Samantha Dougherty, female   DOB: [birth year], 23 y.o.   MRN: [id number]    TELEHEALTH VIRTUAL GYNECOLOGY VISIT ENCOUNTER NOTE  I connected with Samantha Dougherty on 12/20/2018 at  11:45 AM EDT by telephone at home and verified that I am speaking with the correct person using two identifiers.   I discussed the limitations, risks, security and privacy concerns of performing an evaluation and management service by telephone and the availability of in person appointments. I also discussed with the patient that there may be a patient responsible charge related to this service. The patient expressed understanding and agreed to proceed.   History:  Samantha Dougherty is a 23 y.o. G0P0000 female being evaluated today ovarian cysts and endometrioma of ovary 2 in right 1 large one in the left. TV u/s done 12/13/2018. She has hx of DVT, did not go to lungs & seizures.  She has lupus anticoagulant disorder. Pain is chronic ranging from mild to severe. When pain is severe, she vomits and faints which cuases her to have seizures. Periods have been irregular. 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 When she has period is extremely painful. Has "stabbing, pulling"pelvic pain. She has difficulty voiding and pain is 10/10.  Has never been dx with endometriosis. Has gone to [facility] and [facility] for most of her medical care. Discussed the precautions of lupron injection and not containing estrogen or progesterone. Ramonda was made aware that cysts can come back after surgery. She was also made aware of menopausal symptoms of lupron injection;such as hot flashes, night sweats, mood changes, etc.  Cannot take estrogen. Cannot take Progesterone only, gives her flu like symptoms. After a few days she experiences muscle cramps, which eventually causes her to have seizures. She has tried the pill 4 different times with the same results  Mother has questions on [place] to do her own research on medication. Jozey will also do her research on Lupron.          Past Medical History:  Diagnosis Date  . Anti-phospholipid antibody syndrome (HCC)   . Anxiety   . Asthma   . Bipolar 2 disorder (HCC)   . Conversion disorder   . Depression   . DVT (deep venous thrombosis) (HCC)    LLE  . Fibromyalgia   . Gallbladder polyp   . H. pylori infection   . IBS (irritable bowel syndrome)   . Legal blindness    Right eye  . Migraine   . Mood disorder (HCC)   . Psychogenic nonepileptic seizure   . Urinary tract infection    Past Surgical History:  Procedure Laterality Date  . COLONOSCOPY WITH ESOPHAGOGASTRODUODENOSCOPY (EGD)    . HYMENECTOMY  2016   The following portions of the patient's history were reviewed and updated as appropriate: allergies, current medications, past family history, past medical history, past social history, past surgical history and problem list.   Health Maintenance:  Normal pap and negative HRHPV on 10/19/2018.  Review of Systems:  Pertinent items noted in HPI and remainder of comprehensive ROS otherwise negative.  Physical Exam:  Physical exam deferred due to nature of the encounter  Labs and Imaging No results found for this or any previous visit (from the past 336 hour(s)). US Pelvis Transvanginal Non-ob (tv Only)  Result Date: 12/13/2018 GYNECOLOGIC SONOGRAM Samantha Dougherty is a 23 y.o. G0P0000 LMP 12/08/2018,she is here for a pelvic sonogram for pelvic pain with hx of bilateral ovarian cysts. Uterus  6.1 x 3.6 x 4.3 cm, Total uterine volume 49 cc,homogeneous anteverted uterus,wnl Endometrium          4.8 mm, symmetrical, wnl Right ovary             4.9 x 3.4 x 3.4 cm, right ovary contains two complex cysts with low level echoes (ground glass appearance) (#1) 3.1 x 2.4 x 2.3 cm,(#2) 2.5 x 2.6 x 2.5 cm Left ovary                5.9 x 4.6 x 5.5 cm, left ovary contains a complex lesion w/mixed echogenicity 5.3 x 4.9 x 3.4 cm (? Endometrioma),no internal color flow Technician Comments: PELVIC US  TA/TV:homogeneous anteverted uterus,wnl,right ovary contains two complex cysts with low level echoes (ground glass appearance) (#1) 3.1 x 2.4 x 2.3 cm,(#2) 2.5 x 2.6 x 2.5 cm,left ovary contains a  complex lesion w/mixed echogenicity 5.3 x 4.9 x 3.4 cm (? Endometrioma),no internal color flow,no free fluid,pelvic pain during ultrasound,ovaries appear mobile [employer] 12/13/2018 12:40 PM Clinical Impression and recommendations: I have reviewed the sonogram results above. Gyn u/s for pelvic pain Combined with the patient's current clinical course, below are my impressions and any appropriate recommendations for management based on the sonographic findings: FINDINGS: 1. Tiny uterus with clear endometrial stripe and homogenous cyst x 2 consistent with endometrioma. 2. 'uniform ground glass type cysts, most consistent with endometroma. 3 review results ; mother is to be kept still. Cycle with OCP's continuously  US Pelvis (transabdominal Only)  Result Date: 12/13/2018 GYNECOLOGIC SONOGRAM Samantha Dougherty is a 23 y.o. G0P0000 LMP 12/08/2018,she is here for a pelvic sonogram for pelvic pain with hx of bilateral ovarian cysts. Uterus                      6.1 x 3.6 x 4.3 cm, Total uterine volume 49 cc,homogeneous anteverted uterus,wnl Endometrium          4.8 mm, symmetrical, wnl Right ovary             4.9 x 3.4 x 3.4 cm, right ovary contains two complex cysts with low level echoes (ground glass appearance) (#1) 3.1 x 2.4 x 2.3 cm,(#2) 2.5 x 2.6 x 2.5 cm Left ovary                5.9 x 4.6 x 5.5 cm, left ovary contains a complex lesion w/mixed echogenicity 5.3 x 4.9 x 3.4 cm (? Endometrioma),no internal color flow Technician Comments: PELVIC US TA/TV:homogeneous anteverted uterus,wnl,right ovary contains two complex cysts with low level echoes (ground glass appearance) (#1) 3.1 x 2.4 x 2.3 cm,(#2) 2.5 x 2.6 x 2.5 cm,left ovary contains a  complex lesion w/mixed echogenicity 5.3 x 4.9 x 3.4 cm (? Endometrioma),no internal  color flow,no free fluid,pelvic pain during ultrasound,ovaries appear mobile [employer] 12/13/2018 12:40 PM Clinical Impression and recommendations: I have reviewed the sonogram results above. Gyn u/s for pelvic pain Combined with the patient's current clinical course, below are my impressions and any appropriate recommendations for management based on the sonographic findings: FINDINGS: 1. Tiny uterus with clear endometrial stripe and homogenous cyst x 2 consistent with endometrioma. 2. 'uniform ground glass type cysts, most consistent with endometroma. 3 review results ; mother is to be kept still. Cycle with OCP's continuously     Assessment and Plan:     A:  Endometrioma of the ovaries Menorrhagia Dysmenorrhea History sounds consistent with endometriosis, I suspect  there will be widespread disease  P: 1. Referral to [facility] for considering laparoscopy of endometriomas and suspected endometriosis 2. F/u w/ 1 week televisit phonecall to allow time to contact [facility].  Patient and patient's mother also wish for any therapy recommendations of the approved by her  hematologist at [facility] as well as her neurologist at [facility] 3. Possible lupron injection (?)   I discussed the assessment and treatment plan with the patient. The patient was provided an opportunity to ask questions and all were answered. The patient agreed with the plan and demonstrated an understanding of the instructions.   The patient was advised to call back or seek an in-person evaluation/go to the ED if the symptoms worsen or if the condition fails to improve as anticipated.  I provided 28 minutes of non-face-to-face time during this encounter, on the phone.  Plus additional preparation time reviewing the chart and documenting post procedure total time over 45 minutes   By signing my name below, I, Arnette Norris, attest that this documentation has been prepared under the direction and in the presence of Tilda Burrow, MD. Electronically Signed: Arnette Norris Medical Scribe. 12/20/18. 12:04 PM.  I personally performed the services described in this documentation, which was SCRIBED in my presence. The recorded information has been reviewed and considered accurate. It has been edited as necessary during review. Tilda Burrow, MD

## 2018-12-25 ENCOUNTER — Telehealth: Payer: Self-pay | Admitting: *Deleted

## 2018-12-25 NOTE — Telephone Encounter (Signed)
Patient aware and verbalizes understanding. 

## 2018-12-25 NOTE — Telephone Encounter (Signed)
INR therapeutic.  Continue current regimen.  Recheck in 1-2 weeks.

## 2018-12-25 NOTE — Telephone Encounter (Signed)
Fax received [employer] PT/INR self testing service Test date/time 12/22/18 5:03 pm INR 2.3

## 2018-12-28 ENCOUNTER — Other Ambulatory Visit: Payer: Self-pay

## 2018-12-28 ENCOUNTER — Ambulatory Visit: Payer: Self-pay | Admitting: Adult Health
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
## 2019-01-01 ENCOUNTER — Telehealth: Payer: Self-pay

## 2019-01-01 NOTE — Telephone Encounter (Signed)
LM with instructions on VM

## 2019-01-01 NOTE — Telephone Encounter (Signed)
Good reading.  Continue current regimen.  Repeat in 1-2 weeks.

## 2019-01-01 NOTE — Telephone Encounter (Signed)
INR 12/30/2018  2.2  Normal range 2-3

## 2019-01-08 ENCOUNTER — Other Ambulatory Visit: Payer: Self-pay

## 2019-01-08 ENCOUNTER — Ambulatory Visit (INDEPENDENT_AMBULATORY_CARE_PROVIDER_SITE_OTHER): Payer: 59 | Admitting: Psychiatry

## 2019-01-08 ENCOUNTER — Encounter (HOSPITAL_COMMUNITY): Payer: Self-pay | Admitting: Psychiatry

## 2019-01-08 ENCOUNTER — Telehealth: Payer: Self-pay | Admitting: *Deleted

## 2019-01-08 DIAGNOSIS — F330 Major depressive disorder, recurrent, mild: Secondary | ICD-10-CM

## 2019-01-08 MED ORDER — ESCITALOPRAM OXALATE 10 MG PO TABS: 10.0000 mg | ORAL_TABLET | Freq: Every day | ORAL | 0 refills | Status: DC

## 2019-01-08 MED ORDER — ALPRAZOLAM 0.5 MG PO TABS: 0.5000 mg | ORAL_TABLET | Freq: Three times a day (TID) | ORAL | 2 refills | Status: DC | PRN

## 2019-01-08 MED ORDER — BREXPIPRAZOLE 0.5 MG PO TABS: 0.5000 mg | ORAL_TABLET | Freq: Every evening | ORAL | 0 refills | Status: DC

## 2019-01-08 NOTE — Telephone Encounter (Signed)
Pt informed by Mckinley Jewel

## 2019-01-08 NOTE — Patient Instructions (Signed)
1. ContinueLexapro10 mg daily  2.Continue Rexulti 0.5 mg daily 3. ContinueXanax 0.5 mg two to three times a day as needed for anxiety 4. Next appointment: 6/15 at 1 PM for 30 mins, video visit

## 2019-01-08 NOTE — Telephone Encounter (Signed)
Ok to continue current regimen. INR is at goal.

## 2019-01-08 NOTE — Telephone Encounter (Signed)
Fax received [employer] PT/INR self testing service Test date/time 01/07/19 7:31pm  INR 2.1

## 2019-01-19 ENCOUNTER — Telehealth: Payer: Self-pay | Admitting: *Deleted

## 2019-01-19 NOTE — Telephone Encounter (Signed)
lmtcb/ww 

## 2019-01-19 NOTE — Telephone Encounter (Signed)
Last note with directions was on 12/11/18   Note    Increase to 7.5mg  today (1.5 tablets of 5mg ) and then resume taking 5mg  daily.  Recheck in 1 week.      Is this still the dosing, 5 mg one daily? If so, hold today and then resume at 5 mg one daily. Recheck 1 week.

## 2019-01-19 NOTE — Telephone Encounter (Signed)
Fax received [employer] PT/INR self testing service Test date/time 01/18/19  INR 3.3

## 2019-01-23 ENCOUNTER — Encounter: Payer: Self-pay | Admitting: Family Medicine

## 2019-01-25 ENCOUNTER — Encounter: Payer: Self-pay | Admitting: Family Medicine

## 2019-01-26 ENCOUNTER — Telehealth: Payer: Self-pay | Admitting: Physician Assistant

## 2019-01-26 NOTE — Telephone Encounter (Signed)
Fax received  [employer] PT/INR self testing service Test date/time 01/25/19  INR 4.6

## 2019-01-26 NOTE — Telephone Encounter (Signed)
Pt called back and and confirmed correct dose changes - aware

## 2019-01-26 NOTE — Telephone Encounter (Signed)
LM = pt aware by detailed VM on new instructions.  I also requested that she either call or message on mychart stating that she received my message on the changes.

## 2019-01-30 NOTE — Telephone Encounter (Signed)
See call from 01/26/2019

## 2019-02-02 ENCOUNTER — Ambulatory Visit (INDEPENDENT_AMBULATORY_CARE_PROVIDER_SITE_OTHER): Payer: 59 | Admitting: Family Medicine

## 2019-02-02 ENCOUNTER — Other Ambulatory Visit: Payer: Self-pay

## 2019-02-02 ENCOUNTER — Telehealth: Payer: Self-pay | Admitting: *Deleted

## 2019-02-02 DIAGNOSIS — D6862 Lupus anticoagulant syndrome: Secondary | ICD-10-CM | POA: Diagnosis not present

## 2019-02-02 DIAGNOSIS — Z86718 Personal history of other venous thrombosis and embolism: Secondary | ICD-10-CM | POA: Diagnosis not present

## 2019-02-02 MED ORDER — WARFARIN SODIUM 5 MG PO TABS: 5.0000 mg | ORAL_TABLET | Freq: Every day | ORAL | 0 refills | Status: DC

## 2019-02-02 NOTE — Progress Notes (Signed)
Telephone visit  Subjective: CC: Chronic anticoagulation PCP: Raliegh Ip, DO XEN:MMHW D. Krahl is a 23 y.o. female calls for telephone consult today. Patient provides verbal consent for consult held via phone.  Location of patient: home (in [place]) Location of provider: [facility] Others present for call: none  1. Lupus anticogulation She reports compliance with Coumadin.  She notes she was told to take 1/2 tablet on Sunday, Wednesday and 5 mg all other days after being found to be supratherapeutic at 4.6.  She has been compliant with this regimen.  INR was subtherapeutic at 1.7 today.  She denies any chest pain, shortness of breath, lower extremity swelling or increased warmth.  No abnormal bleeding episodes.  ROS: Per HPI  Allergies  Allergen Reactions  . Sulfa Antibiotics Anaphylaxis  . Amitriptyline     Hallucinations   Past Medical History:  Diagnosis Date  . Anti-phospholipid antibody syndrome (HCC)   . Anxiety   . Asthma   . Bipolar 2 disorder (HCC)   . Conversion disorder   . Depression   . DVT (deep venous thrombosis) (HCC)    LLE  . Fibromyalgia   . Gallbladder polyp   . H. pylori infection   . IBS (irritable bowel syndrome)   . Legal blindness    Right eye  . Migraine   . Mood disorder (HCC)   . Psychogenic nonepileptic seizure   . Urinary tract infection     Current Outpatient Medications:  .  ALPRAZolam (XANAX) 0.5 MG tablet, Take 1 tablet (0.5 mg total) by mouth 3 (three) times daily as needed for anxiety., Disp: 90 tablet, Rfl: 2 .  Brexpiprazole (REXULTI) 0.5 MG TABS, Take 1 tablet (0.5 mg total) by mouth every evening., Disp: 90 tablet, Rfl: 0 .  escitalopram (LEXAPRO) 10 MG tablet, Take 1 tablet (10 mg total) by mouth daily., Disp: 90 tablet, Rfl: 0 .  hydroxypropyl methylcellulose / hypromellose (ISOPTO TEARS / GONIOVISC) 2.5 % ophthalmic solution, Place 1 drop into both eyes 3 (three) times daily as needed for dry eyes., Disp: , Rfl:  .  lamoTRIgine  (LAMICTAL) 100 MG tablet, Take 1 tablet (100 mg total) by mouth 2 (two) times daily., Disp: 60 tablet, Rfl: 11 .  warfarin (COUMADIN) 5 MG tablet, Take 1 tablet (5 mg total) by mouth daily., Disp: 90 tablet, Rfl: 0  Assessment/ Plan: 23 y.o. female   1. Lupus anticoagulant disorder (HCC) INR slightly subtherapeutic.  She has very labile INRs and therefore I recommend that she continue with 5 mg daily.  Goal INR 2-3.  She will repeat in 1 week.  We discussed that if her INR continues to be subtherapeutic she will increase to 7.5 mg for 1 day then resume 5 mg daily with repeat the following week.  She was good understanding of this plan will follow-up accordingly - warfarin (COUMADIN) 5 MG tablet; Take 1 tablet (5 mg total) by mouth daily.  Dispense: 90 tablet; Refill: 0  2. History of DVT of lower extremity As above - warfarin (COUMADIN) 5 MG tablet; Take 1 tablet (5 mg total) by mouth daily.  Dispense: 90 tablet; Refill: 0   Start time: 2:32pm End time: 2:38pm  Total time spent on patient care (including telephone call/ virtual visit): 10 minutes  Ashly Hulen Skains, DO [facility] [phone]

## 2019-02-02 NOTE — Telephone Encounter (Signed)
Fax received [employer] PT/INR self testing service Test date/time 02/02/19 9:03 am INR 1.7

## 2019-02-02 NOTE — Telephone Encounter (Signed)
I'm placing her on a phone visit this afternoon to discuss anticoagulation.

## 2019-02-06 ENCOUNTER — Other Ambulatory Visit (HOSPITAL_COMMUNITY): Payer: Self-pay

## 2019-02-09 DIAGNOSIS — R251 Tremor, unspecified: Secondary | ICD-10-CM | POA: Insufficient documentation

## 2019-02-12 DIAGNOSIS — R112 Nausea with vomiting, unspecified: Secondary | ICD-10-CM | POA: Insufficient documentation

## 2019-02-13 ENCOUNTER — Ambulatory Visit (HOSPITAL_COMMUNITY): Payer: Self-pay | Admitting: Hematology

## 2019-02-20 ENCOUNTER — Telehealth: Payer: Self-pay | Admitting: *Deleted

## 2019-02-20 NOTE — Telephone Encounter (Signed)
Fax received [employer] PT/INR self testing service Test date/time 02/20/19 10:29 am INR 1.9

## 2019-02-20 NOTE — Telephone Encounter (Signed)
Parents aware

## 2019-02-20 NOTE — Telephone Encounter (Signed)
Please have her go ahead and increase to 7.5 mg today and then resume 5 mg daily dosing tomorrow.  Recheck INR in 1 week

## 2019-02-27 NOTE — Progress Notes (Signed)
Virtual Visit via Video Note  I connected with Samantha Dougherty on 03/05/19 at  1:00 PM EDT by a video enabled telemedicine application and verified that I am speaking with the correct person using two identifiers.   I discussed the limitations of evaluation and management by telemedicine and the availability of in person appointments. The patient expressed understanding and agreed to proceed.     I discussed the assessment and treatment plan with the patient. The patient was provided an opportunity to ask questions and all were answered. The patient agreed with the plan and demonstrated an understanding of the instructions.   The patient was advised to call back or seek an in-person evaluation if the symptoms worsen or if the condition fails to improve as anticipated.  I provided 25 minutes of non-face-to-face time during this encounter.   Neysa Hottereina Davon Abdelaziz, MD     [facility] MD/PA/NP OP Progress Note  03/05/2019 1:34 PM Samantha Dougherty  MRN:  [id number]  Chief Complaint:  Chief Complaint    Depression; Follow-up     HPI:  - She is seen by neurology. R/o abdominal migraine  This is a follow-up appointment for depression.  She states that she continues to feel anxious.  She occasionally feels angry about this feeling, stating that she does not even know any trigger. It occasionally happened at night. She feels worried about future at times;she hopes to have "average" life, where she can go to college and work.  She finds it very difficult due to her medical condition of seizure.  She also states that she started to have loose stool after eating.  She has an upcoming appointment with a provider. She hopes "not be consumed" by her medical condition.   She agrees that she has been making steady progress, although she may feel it is slow. She is communicating with her neighbors and likes the interaction.  She has hypersomnia; she does not feel refreshed in the morning.  She has fair concentration.  She has  fair motivation and energy.  She denies SI.  She constantly feels tense and anxious. She denies panic attacks. She takes xanax for anxiety.    Visit Diagnosis:    ICD-10-CM   1. MDD (major depressive disorder), recurrent episode, mild (HCC)  F33.0   2. GAD (generalized anxiety disorder)  F41.1     Past Psychiatric History: Please see initial evaluation for full details. I have reviewed the history. No updates at this time.     Past Medical History:  Past Medical History:  Diagnosis Date  . Anti-phospholipid antibody syndrome (HCC)   . Anxiety   . Asthma   . Bipolar 2 disorder (HCC)   . Conversion disorder   . Depression   . DVT (deep venous thrombosis) (HCC)    LLE  . Fibromyalgia   . Gallbladder polyp   . H. pylori infection   . IBS (irritable bowel syndrome)   . Legal blindness    Right eye  . Migraine   . Mood disorder (HCC)   . Psychogenic nonepileptic seizure   . Urinary tract infection     Past Surgical History:  Procedure Laterality Date  . COLONOSCOPY WITH ESOPHAGOGASTRODUODENOSCOPY (EGD)    . HYMENECTOMY  2016    Family Psychiatric History: Please see initial evaluation for full details. I have reviewed the history. No updates at this time.     Family History:  Family History  Problem Relation Age of Onset  . Anxiety disorder Mother   .  Depression Mother   . Hyperlipidemia Mother   . Bipolar disorder Mother   . Thyroid disease Maternal Grandmother   . Deep vein thrombosis Maternal Grandmother   . Thyroid disease Maternal Grandfather   . Anxiety disorder Father   . Depression Father   . Autism Sister   . Other Sister        [place] syndrome  . Factor V Leiden deficiency Maternal Aunt   . Breast cancer Maternal Aunt   . Bipolar disorder Maternal Aunt   . Diabetes Paternal Grandfather   . Deep vein thrombosis Maternal Aunt   . Bipolar disorder Maternal Aunt   . Colon cancer Neg Hx   . Esophageal cancer Neg Hx   . Rectal cancer Neg Hx      Social History:  Social History   Socioeconomic History  . Marital status: Single    Spouse name: Not on file  . Number of children: 0  . Years of education: Not on file  . Highest education level: Not on file  Occupational History  . Occupation: Consulting civil engineerstudent  Social Needs  . Financial resource strain: Not on file  . Food insecurity    Worry: Not on file    Inability: Not on file  . Transportation needs    Medical: Not on file    Non-medical: Not on file  Tobacco Use  . Smoking status: Never Smoker  . Smokeless tobacco: Never Used  Substance and Sexual Activity  . Alcohol use: Never    Frequency: Never  . Drug use: Never  . Sexual activity: Yes    Birth control/protection: None, Condom  Lifestyle  . Physical activity    Days per week: Not on file    Minutes per session: Not on file  . Stress: Not on file  Relationships  . Social Musicianconnections    Talks on phone: Not on file    Gets together: Not on file    Attends religious service: Not on file    Active member of club or organization: Not on file    Attends meetings of clubs or organizations: Not on file    Relationship status: Not on file  Other Topics Concern  . Not on file  Social History Narrative   From [place].  Was receiving medical care at [facility].  Resides with mother.    Allergies:  Allergies  Allergen Reactions  . Sulfa Antibiotics Anaphylaxis  . Amitriptyline     Hallucinations    Metabolic Disorder Labs: Lab Results  Component Value Date   HGBA1C 5.2 08/30/2018   No results found for: PROLACTIN Lab Results  Component Value Date   CHOL 171 04/14/2018   TRIG 140 04/14/2018   HDL 50 04/14/2018   CHOLHDL 3.4 04/14/2018   LDLCALC 93 04/14/2018   Lab Results  Component Value Date   TSH 0.737 08/30/2018    Therapeutic Level Labs: No results found for: LITHIUM No results found for: VALPROATE No components found for:  CBMZ  Current Medications: Current Outpatient Medications  Medication  Sig Dispense Refill  . ALPRAZolam (XANAX) 0.5 MG tablet Take 1 tablet (0.5 mg total) by mouth 3 (three) times daily as needed for anxiety. 90 tablet 1  . [START ON 04/06/2019] Brexpiprazole (REXULTI) 0.5 MG TABS Take 1 tablet (0.5 mg total) by mouth every evening. 90 tablet 0  . [START ON 04/06/2019] escitalopram (LEXAPRO) 10 MG tablet Take 1 tablet (10 mg total) by mouth daily. 90 tablet 0  . hydroxypropyl  methylcellulose / hypromellose (ISOPTO TEARS / GONIOVISC) 2.5 % ophthalmic solution Place 1 drop into both eyes 3 (three) times daily as needed for dry eyes.    Marland Kitchen. lamoTRIgine (LAMICTAL) 100 MG tablet Take 1 tablet (100 mg total) by mouth 2 (two) times daily. 60 tablet 11  . warfarin (COUMADIN) 5 MG tablet Take 1 tablet (5 mg total) by mouth daily. 90 tablet 0   No current facility-administered medications for this visit.      Musculoskeletal: Strength & Muscle Tone: N/ Gait & Station: N/A Patient leans: N/A  Psychiatric Specialty Exam: Review of Systems  Psychiatric/Behavioral: Positive for depression. Negative for hallucinations, memory loss, substance abuse and suicidal ideas. The patient is nervous/anxious and has insomnia.   All other systems reviewed and are negative.   There were no vitals taken for this visit.There is no height or weight on file to calculate BMI.  General Appearance: Fairly Groomed  Eye Contact:  Good  Speech:  Clear and Coherent  Volume:  Normal  Mood:  Anxious  Affect:  Appropriate, Congruent and calmer, reactive  Thought Process:  Coherent  Orientation:  Full (Time, Place, and Person)  Thought Content: Logical   Suicidal Thoughts:  No  Homicidal Thoughts:  No  Memory:  Immediate;   Good  Judgement:  Good  Insight:  Fair  Psychomotor Activity:  Normal  Concentration:  Concentration: Good and Attention Span: Good  Recall:  Good  Fund of Knowledge: Good  Language: Good  Akathisia:  No  Handed:  Right  AIMS (if indicated): not done  Assets:   Communication Skills Desire for Improvement  ADL's:  Intact  Cognition: WNL  Sleep:  Fair   Screenings: PHQ2-9     [facility]2018 in [facility] Office Visit from 08/22/2018 in [facility] Office Visit from 07/17/2018 in [facility] Office Visit from 04/14/2018 in [facility]  PHQ-2 Total Score  3  2  3  6  1   PHQ-9 Total Score  15  12  13  20   -       Assessment and Plan:  Samantha EarlsLyla D. Morain is a 23 y.o. 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   # MDD, mild, recurrent without psychotic features # GAD Although there has been steady improvement in depressive symptoms, the patient continues to have anxiety without significant triggers.  Psychosocial stressors [place] from [place], and physical condition, which includes seizure-like episodes and myalgia.  Although she will benefit from up titration of Lexapro, she would like to stay on the current dose at this time and she will discuss it with her mother.  Discussed potential side effect of nausea and diarrhea.  Will continue Rexulti as adjunctive treatment for depression.  Discussed potential metabolic side effect.  Will continue Xanax as needed for anxiety.  Discussed risk of dependence and oversedation.  She will greatly benefit from CBT/ACT; she will also discuss this with her mother.   # r/ofibromyalgia Patient has some features of fibromyalgia.  Although she will greatly benefit from a graded exercise, she is not interested in this referral.   Plan I have reviewed and updated plans as below 1. ContinueLexapro10 mg daily  2.Continue Rexulti 0.5 mg daily 3. ContinueXanax 0.5 mg two to three times a day  as needed for  anxiety 4.Continue lamotrigine 100 mg twice a day(prescribed by neurologist) 5. Next appointment:  8/10 at 3 PM for 30 mins, video 6. Discuss with your mother regarding the following:  - Increase lexapro to 20 mg daily - Referral to psychotherapy  Past trials of medication:Lexapro,duloxetine (headache),elavil (hallucinations), rexulti, Abilify, Xanax, lamotrigine, temazepam  The patient demonstrates the following risk factors for suicide: Chronic risk factors for suicide include:psychiatric disorder ofdepressionand chronic pain. Acute risk factorsfor suicide include: unemployment and social withdrawal/isolation. Protective factorsfor this patient include: positive social support, coping skills and hope for the future. Considering these factors, the overall suicide risk at this point appears to below. Patientisappropriate for outpatient follow up.  The duration of this appointment visit was 25 minutes of non face-to-face time with the patient.  Greater than 50% of this time was spent in counseling, explanation of  diagnosis, planning of further management, and coordination of care.  Norman Clay, MD 03/05/2019, 1:34 PM

## 2019-03-01 ENCOUNTER — Ambulatory Visit: Payer: Self-pay | Admitting: Family Medicine

## 2019-03-01 ENCOUNTER — Telehealth: Payer: Self-pay | Admitting: *Deleted

## 2019-03-01 LAB — POCT INR: INR: 2.4 (ref 2.0–3.0)

## 2019-03-01 NOTE — Telephone Encounter (Signed)
At goal, continue current regimen 

## 2019-03-01 NOTE — Telephone Encounter (Signed)
LMOVM to continue current dosage

## 2019-03-01 NOTE — Telephone Encounter (Signed)
Fax received [employer] PT/INR self testing service Test date/time 02/28/19 7:39 pm INR 2.4

## 2019-03-05 ENCOUNTER — Other Ambulatory Visit: Payer: Self-pay

## 2019-03-05 ENCOUNTER — Encounter (HOSPITAL_COMMUNITY): Payer: Self-pay | Admitting: Psychiatry

## 2019-03-05 ENCOUNTER — Ambulatory Visit (INDEPENDENT_AMBULATORY_CARE_PROVIDER_SITE_OTHER): Payer: Medicare Other | Admitting: Psychiatry

## 2019-03-05 DIAGNOSIS — F330 Major depressive disorder, recurrent, mild: Secondary | ICD-10-CM

## 2019-03-05 DIAGNOSIS — F411 Generalized anxiety disorder: Secondary | ICD-10-CM

## 2019-03-05 MED ORDER — ALPRAZOLAM 0.5 MG PO TABS: 0.5000 mg | ORAL_TABLET | Freq: Three times a day (TID) | ORAL | 1 refills | Status: DC | PRN

## 2019-03-05 MED ORDER — REXULTI 0.5 MG PO TABS: 0.5000 mg | ORAL_TABLET | Freq: Every evening | ORAL | 0 refills | Status: DC

## 2019-03-05 MED ORDER — ESCITALOPRAM OXALATE 10 MG PO TABS: 10.0000 mg | ORAL_TABLET | Freq: Every day | ORAL | 0 refills | Status: DC

## 2019-03-05 NOTE — Patient Instructions (Signed)
1. ContinueLexapro10 mg daily  2.Continue Rexulti 0.5 mg daily 3. ContinueXanax 0.5 mg two to three times a day as needed for anxiety 4.Continue lamotrigine 100 mg twice a day(prescribed by neurologist) 5. Next appointment:  8/10 at 3 PM  6. Discuss with your mother regarding the following:  - Increase lexapro to 20 mg daily - Referral to psychotherapy

## 2019-03-08 ENCOUNTER — Telehealth: Payer: Self-pay | Admitting: *Deleted

## 2019-03-08 NOTE — Telephone Encounter (Signed)
Fax received [facility] PT/INR self testing service Test date/time 03/07/19 6:30 pm INR 2.2

## 2019-03-23 NOTE — Telephone Encounter (Signed)
Multiple attempts made to contact patient.  This encounter will now be closed  

## 2019-03-26 ENCOUNTER — Encounter: Payer: Self-pay | Admitting: Family Medicine

## 2019-04-04 ENCOUNTER — Telehealth: Payer: Self-pay | Admitting: *Deleted

## 2019-04-04 NOTE — Telephone Encounter (Signed)
Fax received [employer] PT/INR self testing service Test date/time 04/03/19 6:42 pm INR 2.0

## 2019-04-04 NOTE — Telephone Encounter (Signed)
Left message to please call our office. 

## 2019-04-04 NOTE — Telephone Encounter (Signed)
INR at goal. Continue current regimen. Recheck in 1 week

## 2019-04-06 NOTE — Telephone Encounter (Signed)
Patient aware.

## 2019-04-12 ENCOUNTER — Telehealth: Payer: Self-pay | Admitting: *Deleted

## 2019-04-12 NOTE — Telephone Encounter (Signed)
Fax received [employer] PT/INR self testing service Test date/time 03/22/19 4:17 pm INR 1.8

## 2019-04-12 NOTE — Telephone Encounter (Signed)
Pt aware.

## 2019-04-12 NOTE — Telephone Encounter (Signed)
Indication: Lupus anticoagulant disorder Goal INR: 2-3  Recommendations:  If she is not already taking 1.5 tablets on Monday and 1 tablet daily all other days, I would like her to go back to this regimen and recheck in 1 week. (previously taking 1 tablet daily but this was increased 2 weeks ago)  If she is on this regimen, I'd like her to increase to 1.5 tabs on Monday and Friday with 1 tablet all other days and recheck in 1 week.

## 2019-04-13 ENCOUNTER — Encounter: Payer: Self-pay | Admitting: Family Medicine

## 2019-04-20 ENCOUNTER — Telehealth: Payer: Self-pay | Admitting: *Deleted

## 2019-04-20 NOTE — Telephone Encounter (Signed)
Fax received [employer] PT/INR self testing service Test date/time 04/20/19 9:24 am INR 2.4

## 2019-04-20 NOTE — Telephone Encounter (Signed)
04/20/19:   2.4 Goal- INR 2-3  Continue maintenance 7.5 mg Mon Wed Fri, 5 mg all other days Recheck 1 week  Terald Sleeper PA-C [facility] [id number]  [place], [place] [address] [phone]

## 2019-04-20 NOTE — Telephone Encounter (Signed)
Patient aware and verbalized understanding. °

## 2019-04-26 NOTE — Progress Notes (Signed)
Virtual Visit via Video Note  I connected with Samantha EarlsLyla D. Dougherty on 04/30/19 at  3:00 PM EDT by a video enabled telemedicine application and verified that I am speaking with the correct person using two identifiers.   I discussed the limitations of evaluation and management by telemedicine and the availability of in person appointments. The patient expressed understanding and agreed to proceed.     I discussed the assessment and treatment plan with the patient. The patient was provided an opportunity to ask questions and all were answered. The patient agreed with the plan and demonstrated an understanding of the instructions.   The patient was advised to call back or seek an in-person evaluation if the symptoms worsen or if the condition fails to improve as anticipated.  I provided 15 minutes of non-face-to-face time during this encounter.   Neysa Hottereina Kha Hari, MD     [facility] MD/PA/NP OP Progress Note  04/30/2019 3:30 PM Samantha Dougherty  MRN:  [id number]  Chief Complaint:  Chief Complaint    Depression; Follow-up     HPI:  This is a follow-up appointment for depression and anxiety.  She states that she has been feeling more anxious.  She believes it is related to stress because they are in the process of [place].  [place] been [place] between [place], and has lost daily routine.  She has been trying to go outside and play basketball.  It has been sometimes difficult for her to do physical exercise as she has ovarian cyst; although she was supposed to have surgery in July, her provider is only and she will see a new provider next month.  She has occasional insomnia.  She feels fatigue.  She feels down at times.  She has fair concentration.  She denies SI.  She feels anxious sometimes.  She has 2 panic attacks since her last visit.. She takes Xanax almost every day/three times a day for anxiety.  `  Visit Diagnosis:    ICD-10-CM   1. MDD (major depressive  disorder), recurrent episode, mild (HCC)  F33.0   2. GAD (generalized anxiety disorder)  F41.1     Past Psychiatric History: Please see initial evaluation for full details. I have reviewed the history. No updates at this time.     Past Medical History:  Past Medical History:  Diagnosis Date  . Anti-phospholipid antibody syndrome (HCC)   . Anxiety   . Asthma   . Bipolar 2 disorder (HCC)   . Conversion disorder   . Depression   . DVT (deep venous thrombosis) (HCC)    LLE  . Fibromyalgia   . Gallbladder polyp   . H. pylori infection   . IBS (irritable bowel syndrome)   . Legal blindness    Right eye  . Migraine   . Mood disorder (HCC)   . Psychogenic nonepileptic seizure   . Urinary tract infection     Past Surgical History:  Procedure Laterality Date  . COLONOSCOPY WITH ESOPHAGOGASTRODUODENOSCOPY (EGD)    . HYMENECTOMY  2016    Family Psychiatric History: Please see initial evaluation for full details. I have reviewed the history. No updates at this time.     Family History:  Family History  Problem Relation Age of Onset  . Anxiety disorder Mother   . Depression Mother   . Hyperlipidemia Mother   . Bipolar disorder Mother   . Thyroid disease Maternal Grandmother   . Deep vein thrombosis Maternal Grandmother   .  Thyroid disease Maternal Grandfather   . Anxiety disorder Father   . Depression Father   . Autism Sister   . Other Sister        [place] syndrome  . Factor V Leiden deficiency Maternal Aunt   . Breast cancer Maternal Aunt   . Bipolar disorder Maternal Aunt   . Diabetes Paternal Grandfather   . Deep vein thrombosis Maternal Aunt   . Bipolar disorder Maternal Aunt   . Colon cancer Neg Hx   . Esophageal cancer Neg Hx   . Rectal cancer Neg Hx     Social History:  Social History   Socioeconomic History  . Marital status: Single    Spouse name: Not on file  . Number of children: 0  . Years of education: Not on file  . Highest education level:  Not on file  Occupational History  . Occupation: Consulting civil engineerstudent  Social Needs  . Financial resource strain: Not on file  . Food insecurity    Worry: Not on file    Inability: Not on file  . Transportation needs    Medical: Not on file    Non-medical: Not on file  Tobacco Use  . Smoking status: Never Smoker  . Smokeless tobacco: Never Used  Substance and Sexual Activity  . Alcohol use: Never    Frequency: Never  . Drug use: Never  . Sexual activity: Yes    Birth control/protection: None, Condom  Lifestyle  . Physical activity    Days per week: Not on file    Minutes per session: Not on file  . Stress: Not on file  Relationships  . Social Musicianconnections    Talks on phone: Not on file    Gets together: Not on file    Attends religious service: Not on file    Active member of club or organization: Not on file    Attends meetings of clubs or organizations: Not on file    Relationship status: Not on file  Other Topics Concern  . Not on file  Social History Narrative   From [place].  [facility].  Resides with mother.    Allergies:  Allergies  Allergen Reactions  . Sulfa Antibiotics Anaphylaxis  . Amitriptyline     Hallucinations    Metabolic Disorder Labs: Lab Results  Component Value Date   HGBA1C 5.2 08/30/2018   No results found for: PROLACTIN Lab Results  Component Value Date   CHOL 171 04/14/2018   TRIG 140 04/14/2018   HDL 50 04/14/2018   CHOLHDL 3.4 04/14/2018   LDLCALC 93 04/14/2018   Lab Results  Component Value Date   TSH 0.737 08/30/2018    Therapeutic Level Labs: No results found for: LITHIUM No results found for: VALPROATE No components found for:  CBMZ  Current Medications: Current Outpatient Medications  Medication Sig Dispense Refill  . [START ON 05/16/2019] ALPRAZolam (XANAX) 0.5 MG tablet Take 1 tablet (0.5 mg total) by mouth 3 (three) times daily as needed for anxiety. 90 tablet 1  . Brexpiprazole (REXULTI) 0.5 MG  TABS Take 1 tablet (0.5 mg total) by mouth every evening. 90 tablet 0  . escitalopram (LEXAPRO) 10 MG tablet Take 1 tablet (10 mg total) by mouth daily. 90 tablet 0  . hydroxypropyl methylcellulose / hypromellose (ISOPTO TEARS / GONIOVISC) 2.5 % ophthalmic solution Place 1 drop into both eyes 3 (three) times daily as needed for dry eyes.    Marland Kitchen. lamoTRIgine (LAMICTAL) 100 MG  tablet Take 1 tablet (100 mg total) by mouth 2 (two) times daily. 60 tablet 11  . warfarin (COUMADIN) 5 MG tablet Take 1 tablet (5 mg total) by mouth daily. 90 tablet 0   No current facility-administered medications for this visit.      Musculoskeletal: Strength & Muscle Tone: N/A Gait & Station: N/A Patient leans: N/A  Psychiatric Specialty Exam: Review of Systems  Psychiatric/Behavioral: Negative for depression, hallucinations, memory loss, substance abuse and suicidal ideas. The patient is nervous/anxious and has insomnia.   All other systems reviewed and are negative.   There were no vitals taken for this visit.There is no height or weight on file to calculate BMI.  General Appearance: Fairly Groomed  Eye Contact:  Good  Speech:  Clear and Coherent  Volume:  Normal  Mood:  Anxious  Affect:  Appropriate, Congruent and slighlty down  Thought Process:  Coherent  Orientation:  Full (Time, Place, and Person)  Thought Content: Logical   Suicidal Thoughts:  No  Homicidal Thoughts:  No  Memory:  Immediate;   Good  Judgement:  Good  Insight:  Fair  Psychomotor Activity:  Normal  Concentration:  Concentration: Good and Attention Span: Good  Recall:  Good  Fund of Knowledge: Good  Language: Good  Akathisia:  No  Handed:  Right  AIMS (if indicated): not done  Assets:  Communication Skills Desire for Improvement  ADL's:  Intact  Cognition: WNL  Sleep:  Poor   Screenings: PHQ2-9     [facility] from [facility] in [facility]  Visit from 08/22/2018 in [facility] Office Visit from 07/17/2018 in [facility] Office Visit from 04/14/2018 in [facility]  PHQ-2 Total Score  3  2  3  6  1   PHQ-9 Total Score  15  12  13  20   -       Assessment and Plan:  Samantha Dougherty is a 23 y.o. year old female with a history of depression, non epileptic seizure, hypercoagulable state, Lupus anticoagulant disorder with history of DVT , who presents for follow up appointment for depression.   # MDD, mild, recurrent without psychotic features # GAD She continues to report anxiety in the context of losing daily structure due to [place] between [place].  Other psychosocial stressors includes [place] from [place], and her physical condition, which includes seizure-like episodes and myalgia.  Although she will benefit from up titration of Lexapro, she has preference to stay on the current medication at this time.  Will continue Lexapro to target depression and anxiety.  Will continue Rexulti as adjunctive treatment for depression.  Discussed potential metabolic side effect.  Will continue Xanax as needed for anxiety.  Discussed risk of dependence and oversedation.  Although she will greatly benefit from CBT, she is not interested in this option.   Plan I have reviewed and updated plans as below 1. ContinueLexapro10 mg daily  2.Continue Rexulti 0.5 mg daily 3. ContinueXanax 0.5 mgtwo tothree times a dayas neededfor anxiety 4.Continue lamotrigine 100 mg twice a day(prescribed by neurologist) 5. Next appointment:  10/1 at 2 PM for 30 mins, video  Past trials of medication:Lexapro,duloxetine (headache),elavil (hallucinations), Rexulti, Abilify, Xanax, lamotrigine, temazepam  The patient demonstrates the following risk factors for suicide: Chronic risk factors for suicide include:psychiatric disorder ofdepressionand chronic pain. Acute risk  factorsfor suicide include: unemployment and social withdrawal/isolation. Protective factorsfor  this patient include: positive social support, coping skills and hope for the future. Considering these factors, the overall suicide risk at this point appears to below. Patientisappropriate for outpatient follow up.  Norman Clay, MD 04/30/2019, 3:30 PM

## 2019-04-30 ENCOUNTER — Ambulatory Visit (INDEPENDENT_AMBULATORY_CARE_PROVIDER_SITE_OTHER): Payer: 59 | Admitting: Psychiatry

## 2019-04-30 ENCOUNTER — Other Ambulatory Visit: Payer: Self-pay

## 2019-04-30 ENCOUNTER — Encounter (HOSPITAL_COMMUNITY): Payer: Self-pay | Admitting: Psychiatry

## 2019-04-30 DIAGNOSIS — F330 Major depressive disorder, recurrent, mild: Secondary | ICD-10-CM | POA: Diagnosis not present

## 2019-04-30 DIAGNOSIS — F411 Generalized anxiety disorder: Secondary | ICD-10-CM

## 2019-04-30 MED ORDER — ALPRAZOLAM 0.5 MG PO TABS: 0.5000 mg | ORAL_TABLET | Freq: Three times a day (TID) | ORAL | 1 refills | Status: DC | PRN

## 2019-04-30 NOTE — Patient Instructions (Signed)
1. ContinueLexapro10 mg daily  2.Continue Rexulti 0.5 mg daily 3. ContinueXanax 0.5 mgtwo tothree times a dayas neededfor anxiety 4.Continue lamotrigine 100 mg twice a day 5. Next appointment:10/1 at 2 PM

## 2019-05-04 ENCOUNTER — Telehealth: Payer: Self-pay | Admitting: *Deleted

## 2019-05-04 NOTE — Telephone Encounter (Signed)
Attempted to reach patient.  Mother will give message to call office.  When she calls, please confirm dosing.  Was last instructed to take 1.5 tabs Monday and 1 tablet all other days.  She is slightly subtherapeutic.  Would recommend increasing to 1.5 tabs Monday and Friday w 1 tablet daily all other days.  Recheck in 1 week.  Indication: Lupus Anticoagulant disorder Goal INR: 2-3

## 2019-05-04 NOTE — Telephone Encounter (Signed)
Fax received [employer] PT/INR self testing service Test date/time 05/03/19 1:45 pm INR 1.8

## 2019-05-10 ENCOUNTER — Telehealth: Payer: Self-pay | Admitting: *Deleted

## 2019-05-10 NOTE — Telephone Encounter (Signed)
Aware of coumadin dosage  

## 2019-05-10 NOTE — Telephone Encounter (Signed)
She is at therapeutic goal.  Continue current regimen.

## 2019-05-10 NOTE — Telephone Encounter (Signed)
Fax received [employer] connected PT/INR self testing service Test date/time 05/10/19 8:46 am INR 2.0

## 2019-05-10 NOTE — Telephone Encounter (Signed)
Multiple attempts made to contact patient.  This encounter will now be closed  

## 2019-05-17 ENCOUNTER — Telehealth: Payer: Self-pay | Admitting: *Deleted

## 2019-05-17 NOTE — Telephone Encounter (Signed)
Fax received [employer] PT/INR self testing service Test date/time 05/16/19  INR 1.5

## 2019-05-18 NOTE — Telephone Encounter (Signed)
Description   inr 1.5,  Goal- INR 2-3   Increase maintenance to take 5 mg Mon Wed Fri, 7.5 mg all other days Recheck 1 week     Caryl Pina, MD [facility] 05/18/2019, 8:28 AM

## 2019-05-18 NOTE — Telephone Encounter (Signed)
Attempted to contact patient - NA °

## 2019-05-23 NOTE — Telephone Encounter (Signed)
No answer

## 2019-05-25 DIAGNOSIS — N906 Unspecified hypertrophy of vulva: Secondary | ICD-10-CM | POA: Insufficient documentation

## 2019-05-30 NOTE — Telephone Encounter (Signed)
Multiple attempts made to contact patient.  This encounter will now be closed  

## 2019-06-01 ENCOUNTER — Telehealth: Payer: Self-pay | Admitting: *Deleted

## 2019-06-01 NOTE — Telephone Encounter (Signed)
Fax received [employer] PT/INR self testing service Test date/time 05/31/19 10:52 pm INR 1.8

## 2019-06-01 NOTE — Telephone Encounter (Signed)
Was last instructed to take 1.5 tabs Monday, Wed and Friday w 1 tablet daily all other days.    Can you see if she started this increased dose?    If not please proceed.  If so, please increase to 1.5 tabets M,W,F, Sun. And 1 tablet daily all other days.  Recheck in 1 week.  Indication: Lupus Anticoagulant disorder Goal INR: 2-3

## 2019-06-01 NOTE — Telephone Encounter (Signed)
Patient states that she has been taking coumadin 5mg .  Instructed to increase to 7.5mg  on MWF, Sun and 5mg  all other days.  Patient verbalized understanding.

## 2019-06-07 ENCOUNTER — Ambulatory Visit (INDEPENDENT_AMBULATORY_CARE_PROVIDER_SITE_OTHER): Payer: 59 | Admitting: Family Medicine

## 2019-06-07 ENCOUNTER — Telehealth: Payer: Self-pay | Admitting: *Deleted

## 2019-06-07 DIAGNOSIS — Z7901 Long term (current) use of anticoagulants: Secondary | ICD-10-CM

## 2019-06-07 DIAGNOSIS — G8929 Other chronic pain: Secondary | ICD-10-CM

## 2019-06-07 DIAGNOSIS — N801 Endometriosis of ovary: Secondary | ICD-10-CM | POA: Diagnosis not present

## 2019-06-07 DIAGNOSIS — R102 Pelvic and perineal pain: Secondary | ICD-10-CM

## 2019-06-07 DIAGNOSIS — N80129 Deep endometriosis of ovary, unspecified ovary: Secondary | ICD-10-CM

## 2019-06-07 NOTE — Telephone Encounter (Signed)
Spoke to patient on phone today for televisit.  She is to continue her current regimen until she reaches out to her hematologist with regards to holding Coumadin for laboratory testing.

## 2019-06-07 NOTE — Progress Notes (Signed)
Telephone visit  Subjective: CC: surgery? PCP: Janora Norlander, DO BZJ:IRCV D. Pelaez is a 23 y.o. female calls for telephone consult today. Patient provides verbal consent for consult held via phone.  Location of patient: home in [place] Location of provider: Working remotely from home Others present for call: mom  1.  Endometrioma/chronic pelvic pain Patient reports that she saw the GYN for endometriomas and chronic pelvic pain.  There was concern for possible endometriosis and the plan was to wait until November for repeat u/s before making a plan.  However, today she reports that she is having excruciating pain and has been bed ridden for several days.  She is wondering if we should proceed with ultrasound sooner than November as she would like to expedite surgery prior to the new year.  She reports poor quality of life and notes that she is in pain more days than not, citing that she only has 3 pain-free days per month.  2.  Hypercoagulable state Patient saw her hematologist in July and at that time the recommendation was to discontinue her Coumadin on 31 July and return in August for laboratory work-up.  She notes that she was put on isolation precautions as she was out of state and did not perform the work-up as directed.  She is calling to get my opinion with regards to his recommendations.  Her INR today was 2.6.  She is compliant with Coumadin currently and is not having any abnormal bleeding or concerns for DVT.    ROS: Per HPI  Allergies  Allergen Reactions  . Sulfa Antibiotics Anaphylaxis  . Amitriptyline     Hallucinations   Past Medical History:  Diagnosis Date  . Anti-phospholipid antibody syndrome (HCC)   . Anxiety   . Asthma   . Bipolar 2 disorder ([place])   . Conversion disorder   . Depression   . DVT (deep venous thrombosis) (HCC)    LLE  . Fibromyalgia   . Gallbladder polyp   . H. pylori infection   . IBS (irritable bowel syndrome)   . Legal blindness    Right  eye  . Migraine   . Mood disorder ([place])   . Psychogenic nonepileptic seizure   . Urinary tract infection     Current Outpatient Medications:  .  ALPRAZolam (XANAX) 0.5 MG tablet, Take 1 tablet (0.5 mg total) by mouth 3 (three) times daily as needed for anxiety., Disp: 90 tablet, Rfl: 1 .  Brexpiprazole (REXULTI) 0.5 MG TABS, Take 1 tablet (0.5 mg total) by mouth every evening., Disp: 90 tablet, Rfl: 0 .  escitalopram (LEXAPRO) 10 MG tablet, Take 1 tablet (10 mg total) by mouth daily., Disp: 90 tablet, Rfl: 0 .  hydroxypropyl methylcellulose / hypromellose (ISOPTO TEARS / GONIOVISC) 2.5 % ophthalmic solution, Place 1 drop into both eyes 3 (three) times daily as needed for dry eyes., Disp: , Rfl:  .  lamoTRIgine (LAMICTAL) 100 MG tablet, Take 1 tablet (100 mg total) by mouth 2 (two) times daily., Disp: 60 tablet, Rfl: 11 .  warfarin (COUMADIN) 5 MG tablet, Take 1 tablet (5 mg total) by mouth daily., Disp: 90 tablet, Rfl: 0  Assessment/ Plan: 23 y.o. female   1. Chronic pelvic pain in female Given the level of pain that she is then, we will go ahead and proceed with repeat ultrasound and I will CC the results to her gynecologist for further evaluation and management. - US Transvaginal Non-OB; Future - US PELVIS (TRANSABDOMINAL ONLY); Future  2. Endometrioma of ovary - US Transvaginal Non-OB; Future - US PELVIS (TRANSABDOMINAL ONLY); Future  3. Chronic anticoagulation We spent quite a bit of time discussing the recommendations and the risk for clot off of Coumadin.  I read her hematologist note and I am in agreement with assessment and plan.  I did ask that they go ahead and reach out to him with regards to how long she should wait after discontinuing anticoagulation to obtain her labs.  Apparently these were delayed because she was at a state and was on isolation precautions.   Start time: 2:57pm End time: 3:18pm  Total time spent on patient care (including telephone call/ virtual  visit): 26 minutes  Kaliope Quinonez Hulen SkainsM Lujuana Kapler, DO [facility] [phone]

## 2019-06-07 NOTE — Telephone Encounter (Signed)
Fax received [employer] PT/INR self testing service Test date/time 06/07/19 10:19 am INR 2.6

## 2019-06-14 ENCOUNTER — Other Ambulatory Visit: Payer: Self-pay | Admitting: Neurology

## 2019-06-14 ENCOUNTER — Other Ambulatory Visit: Payer: Self-pay

## 2019-06-14 ENCOUNTER — Telehealth: Payer: Self-pay | Admitting: Family Medicine

## 2019-06-14 ENCOUNTER — Other Ambulatory Visit: Payer: 59

## 2019-06-14 NOTE — Progress Notes (Signed)
BH MD/PA/NP OP Progress Note  06/21/2019 2:35 PM Samantha Dougherty  MRN:  [id number] Virtual Visit via Video Note  I connected with Samantha Dougherty on 06/21/19 at  2:00 PM EDT by a video enabled telemedicine application and verified that I am speaking with the correct person using two identifiers.   I discussed the limitations of evaluation and management by telemedicine and the availability of in person appointments. The patient expressed understanding and agreed to proceed.     I discussed the assessment and treatment plan with the patient. The patient was provided an opportunity to ask questions and all were answered. The patient agreed with the plan and demonstrated an understanding of the instructions.   The patient was advised to call back or seek an in-person evaluation if the symptoms worsen or if the condition fails to improve as anticipated.  I provided 25 minutes of non-face-to-face time during this encounter.   Samantha Hotter, MD   Chief Complaint:  Chief Complaint    Depression; Follow-up     HPI:  This is a follow-up appointment for depression.  She states that she has been feeling depressed.   She was found to have endometrioma, and she may need a surgery.  The earliest appointment with OB/GYN is in a month. She struggles with pain, nausea, light dizziness, chest pain, and stays in the bed most of the time. She feels it is "discouraging" as she likes to be outside. She also reports stress about housing issues. There has been argument among family members as there were full of people in the house (her uncle, cousin, grandfather, sister, father and her mother). They decided to live apart, and she currently lives with her parents, sister at her aunt's house. They have not been able to buy a house yet as the house in [place] is still on the market. She has fair sleep.  She has mild anhedonia.  She has fair concentration.  She has fair energy.  She denies SI.  She feels anxious at times  and occasionally has intense anxiety, although she has been able to respond well by listening to music. She denies panic attacks. She takes Xanax 2-3 times per day for anxiety. She agrees to try sitting in the porch and have daily routine if it is difficult for her to take a walk due to physical condition.    [place] on 05/2019 BILATERAL ovarian masses, 2 on RIGHT and 1 on LEFT which likely represent endometriomas.  Additional LEFT ovarian mass 5.3 cm in greatest size question endometrioma versus hemorrhagic cyst.  Third LEFT ovarian lesion appears represent a hemorrhagic cyst 4.9 cm in greatest size.  RIGHT ovarian lesions have slightly increased in size since the previous exam.  LEFT ovary on the previous study appears to represented a large hemorrhagic cyst but LEFT ovary now is larger and has a significantly different appearance, appearing as 3 separate entities, 2 of which may represent evolving hemorrhagic cystic components.   Visit Diagnosis:    ICD-10-CM   1. MDD (major depressive disorder), recurrent episode, mild (HCC)  F33.0   2. GAD (generalized anxiety disorder)  F41.1     Past Psychiatric History: Please see initial evaluation for full details. I have reviewed the history. No updates at this time.     Past Medical History:  Past Medical History:  Diagnosis Date  . Anti-phospholipid antibody syndrome (HCC)   . Anxiety   . Asthma   . Bipolar 2 disorder (HCC)   .  Conversion disorder   . Depression   . DVT (deep venous thrombosis) (HCC)    LLE  . Fibromyalgia   . Gallbladder polyp   . H. pylori infection   . IBS (irritable bowel syndrome)   . Legal blindness    Right eye  . Migraine   . Mood disorder (HCC)   . Psychogenic nonepileptic seizure   . Urinary tract infection     Past Surgical History:  Procedure Laterality Date  . COLONOSCOPY WITH ESOPHAGOGASTRODUODENOSCOPY (EGD)    . HYMENECTOMY  2016    Family Psychiatric History: Please see initial  evaluation for full details. I have reviewed the history. No updates at this time.     Family History:  Family History  Problem Relation Age of Onset  . Anxiety disorder Mother   . Depression Mother   . Hyperlipidemia Mother   . Bipolar disorder Mother   . Thyroid disease Maternal Grandmother   . Deep vein thrombosis Maternal Grandmother   . Thyroid disease Maternal Grandfather   . Anxiety disorder Father   . Depression Father   . Autism Sister   . Other Sister        [place] syndrome  . Factor V Leiden deficiency Maternal Aunt   . Breast cancer Maternal Aunt   . Bipolar disorder Maternal Aunt   . Diabetes Paternal Grandfather   . Deep vein thrombosis Maternal Aunt   . Bipolar disorder Maternal Aunt   . Colon cancer Neg Hx   . Esophageal cancer Neg Hx   . Rectal cancer Neg Hx     Social History:  Social History   Socioeconomic History  . Marital status: Single    Spouse name: Not on file  . Number of children: 0  . Years of education: Not on file  . Highest education level: Not on file  Occupational History  . Occupation: Consulting civil engineerstudent  Social Needs  . Financial resource strain: Not on file  . Food insecurity    Worry: Not on file    Inability: Not on file  . Transportation needs    Medical: Not on file    Non-medical: Not on file  Tobacco Use  . Smoking status: Never Smoker  . Smokeless tobacco: Never Used  Substance and Sexual Activity  . Alcohol use: Never    Frequency: Never  . Drug use: Never  . Sexual activity: Yes    Birth control/protection: None, Condom  Lifestyle  . Physical activity    Days per week: Not on file    Minutes per session: Not on file  . Stress: Not on file  Relationships  . Social Musicianconnections    Talks on phone: Not on file    Gets together: Not on file    Attends religious service: Not on file    Active member of club or organization: Not on file    Attends meetings of clubs or organizations: Not on file    Relationship status:  Not on file  Other Topics Concern  . Not on file  Social History Narrative   From [place].  [facility]  Resides with mother.    Allergies:  Allergies  Allergen Reactions  . Sulfa Antibiotics Anaphylaxis  . Amitriptyline     Hallucinations    Metabolic Disorder Labs: Lab Results  Component Value Date   HGBA1C 5.2 08/30/2018   No results found for: PROLACTIN Lab Results  Component Value Date   CHOL 171 04/14/2018  TRIG 140 04/14/2018   HDL 50 04/14/2018   CHOLHDL 3.4 04/14/2018   LDLCALC 93 04/14/2018   Lab Results  Component Value Date   TSH 0.737 08/30/2018    Therapeutic Level Labs: No results found for: LITHIUM No results found for: VALPROATE No components found for:  CBMZ  Current Medications: Current Outpatient Medications  Medication Sig Dispense Refill  . [START ON 07/16/2019] ALPRAZolam (XANAX) 0.5 MG tablet Take 1 tablet (0.5 mg total) by mouth 3 (three) times daily as needed for anxiety. 90 tablet 3  . [START ON 07/05/2019] Brexpiprazole (REXULTI) 0.5 MG TABS Take 1 tablet (0.5 mg total) by mouth every evening. 90 tablet 1  . escitalopram (LEXAPRO) 10 MG tablet Take 1 tablet (10 mg total) by mouth daily. 90 tablet 0  . hydroxypropyl methylcellulose / hypromellose (ISOPTO TEARS / GONIOVISC) 2.5 % ophthalmic solution Place 1 drop into both eyes 3 (three) times daily as needed for dry eyes.    Marland Kitchen lamoTRIgine (LAMICTAL) 100 MG tablet Take 1 tablet (100 mg total) by mouth 2 (two) times daily. 60 tablet 11  . warfarin (COUMADIN) 5 MG tablet Take 1 tablet (5 mg total) by mouth daily. 90 tablet 0   No current facility-administered medications for this visit.      Musculoskeletal: Strength & Muscle Tone: N/A Gait & Station: N/A Patient leans: N/A  Psychiatric Specialty Exam: Review of Systems  Psychiatric/Behavioral: Positive for depression. Negative for hallucinations, memory loss, substance abuse and suicidal ideas. The patient is  nervous/anxious. The patient does not have insomnia.   All other systems reviewed and are negative.   There were no vitals taken for this visit.There is no height or weight on file to calculate BMI.  General Appearance: Fairly Groomed  Eye Contact:  Good  Speech:  Clear and Coherent  Volume:  Normal  Mood:  Depressed  Affect:  Appropriate, Congruent and calm  Thought Process:  Coherent  Orientation:  Full (Time, Place, and Person)  Thought Content: Logical   Suicidal Thoughts:  No  Homicidal Thoughts:  No  Memory:  Immediate;   Good  Judgement:  Good  Insight:  Fair  Psychomotor Activity:  Normal  Concentration:  Concentration: Good and Attention Span: Good  Recall:  Good  Fund of Knowledge: Good  Language: Good  Akathisia:  No  Handed:  Right  AIMS (if indicated): not done  Assets:  Communication Skills Desire for Improvement  ADL's:  Intact  Cognition: WNL  Sleep:  Fair   Screenings: PHQ2-9     Office Visit from 10/19/2018 in [facility] Office Visit from 08/30/2018 in [facility] Office Visit from 08/22/2018 in [facility] Office Visit from 07/17/2018 in [facility] Office Visit from 04/14/2018 in [facility]  PHQ-2 Total Score  3  2  3  6  1   PHQ-9 Total Score  15  12  13  20   -       Assessment and Plan:  is a 23 y.o. 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  She also does  have other physical condition, which includes seizure-like  episodes and myalgia.  She is advised again to uptitrate Lexapro to target residual mood symptoms.  She would like to discuss it with her mother first.  Will continue Lexapro to target depression and anxiety.  Will continue Rexulti as adjunctive treatment for depression.  Discussed potential metabolic side effect.  Will continue Xanax as needed for anxiety.  Discussed risk of dependence and oversedation. Discussed behavioral activation. Although she will greatly benefit from CBT, she would like to hold this option at this time until she is seen by neuropsychologist [facility] which was deferred by her neurologist.   Plan I have reviewed and updated plans as below 1. Discussed uptitration of lexapro 20 mg daily. She will contact the office after discussing with her mother (if not interested, will continue 10 mg daily) 2.Continue Rexulti 0.5 mg daily 3. ContinueXanax 0.5 mgtwo tothree times a dayas neededfor anxiety 4.Continue lamotrigine 100 mg twice a day(prescribed by neurologist) 5. Next appointment:in 3 months - She has an upcoming appointment at [facility] neuropsychology, which was referred by her neurologist  Past trials of medication:Lexapro,duloxetine (headache),elavil (hallucinations), Rexulti, Abilify, Xanax, lamotrigine, temazepam  The patient demonstrates the following risk factors for suicide: Chronic risk factors for suicide include:psychiatric disorder ofdepressionand chronic pain. Acute risk factorsfor suicide include: unemployment and social withdrawal/isolation. Protective factorsfor this patient include: positive social support, coping skills and hope for the future. Considering these factors, the overall suicide risk at this point appears to below. Patientisappropriate for outpatient follow up.  The duration of this appointment visit was 25 minutes of non face-to-face time with the patient.  Greater than 50% of this time was spent in counseling, explanation of   diagnosis, planning of further management, and coordination of care.   Norman Clay, MD 06/21/2019, 2:35 PM

## 2019-06-15 ENCOUNTER — Ambulatory Visit (HOSPITAL_COMMUNITY)
Admission: RE | Admit: 2019-06-15 | Discharge: 2019-06-15 | Disposition: A | Discharge status: Home / Self Care | Payer: 59 | Source: Ambulatory Visit | Attending: Family Medicine | Admitting: Family Medicine

## 2019-06-15 ENCOUNTER — Encounter: Payer: Self-pay | Admitting: Family Medicine

## 2019-06-15 DIAGNOSIS — N80129 Deep endometriosis of ovary, unspecified ovary: Secondary | ICD-10-CM

## 2019-06-15 DIAGNOSIS — R102 Pelvic and perineal pain: Secondary | ICD-10-CM | POA: Diagnosis not present

## 2019-06-15 DIAGNOSIS — G8929 Other chronic pain: Secondary | ICD-10-CM | POA: Diagnosis present

## 2019-06-15 DIAGNOSIS — N801 Endometriosis of ovary: Secondary | ICD-10-CM | POA: Diagnosis present

## 2019-06-21 ENCOUNTER — Ambulatory Visit (INDEPENDENT_AMBULATORY_CARE_PROVIDER_SITE_OTHER): Payer: 59 | Admitting: Psychiatry

## 2019-06-21 ENCOUNTER — Other Ambulatory Visit: Payer: Self-pay

## 2019-06-21 ENCOUNTER — Encounter (HOSPITAL_COMMUNITY): Payer: Self-pay | Admitting: Psychiatry

## 2019-06-21 DIAGNOSIS — F330 Major depressive disorder, recurrent, mild: Secondary | ICD-10-CM

## 2019-06-21 DIAGNOSIS — F411 Generalized anxiety disorder: Secondary | ICD-10-CM

## 2019-06-21 MED ORDER — ALPRAZOLAM 0.5 MG PO TABS: 0.5000 mg | ORAL_TABLET | Freq: Three times a day (TID) | ORAL | 3 refills | Status: DC | PRN

## 2019-06-21 MED ORDER — REXULTI 0.5 MG PO TABS: 0.5000 mg | ORAL_TABLET | Freq: Every evening | ORAL | 1 refills | Status: DC

## 2019-06-21 NOTE — Patient Instructions (Signed)
1. Please contact our office about lexapro dose 2.Continue Rexulti 0.5 mg daily 3. ContinueXanax 0.5 mgtwo tothree times a dayas neededfor anxiety 4.Continue lamotrigine 100 mg twice a day(prescribed by neurologist) 5. Next appointment:in 3 months

## 2019-06-25 ENCOUNTER — Telehealth (HOSPITAL_COMMUNITY): Payer: Self-pay | Admitting: Psychiatry

## 2019-06-25 NOTE — Telephone Encounter (Signed)
per provider: Could you contact and ask the patient if she decided to continue LEXAPRO 10 mg daily, or increase to 20 mg daily so that I can send the order to the pharmacy . LM  (with dad for a call back to Dr Modesta Messing for med check)

## 2019-06-25 NOTE — Telephone Encounter (Signed)
Could you contact and ask the patient if she decided to continue lexapro 10 mg daily, or increase to 20 mg daily so that I can send the order to the pharmacy (unless she left a voice message to [place]?) Thanks.

## 2019-06-26 NOTE — Telephone Encounter (Signed)
LVM:  Mom's phone [phone] to f/u and ask the patient if she decided to continue LEXAPRO 10 mg daily, or increase to 20 mg daily so that I can send the order to the pharmacy .

## 2019-07-16 ENCOUNTER — Telehealth (HOSPITAL_COMMUNITY): Payer: Self-pay | Admitting: *Deleted

## 2019-07-16 ENCOUNTER — Other Ambulatory Visit (HOSPITAL_COMMUNITY): Payer: Self-pay | Admitting: Psychiatry

## 2019-07-16 MED ORDER — ESCITALOPRAM OXALATE 20 MG PO TABS: 20.0000 mg | ORAL_TABLET | Freq: Every day | ORAL | 1 refills | Status: DC

## 2019-07-16 NOTE — Telephone Encounter (Signed)
ordered

## 2019-07-16 NOTE — Telephone Encounter (Signed)
Patient has returned call  after numerous attempts & LVM & has requested the Lexapro 20 mg

## 2019-07-18 ENCOUNTER — Other Ambulatory Visit: Payer: Self-pay | Admitting: Neurology

## 2019-07-20 ENCOUNTER — Other Ambulatory Visit: Payer: Self-pay | Admitting: Neurology

## 2019-09-19 IMAGING — US US EXTREM LOW VENOUS*L*
1 series · 14 of 24 positions shown · non-contrast
Comparison: None

CLINICAL DATA: Pain and swelling x2 days

EXAM:
LEFT LOWER EXTREMITY VENOUS DOPPLER ULTRASOUND
TECHNIQUE: Gray-scale sonography with compression, as well as color and duplex
ultrasound, were performed to evaluate the deep venous system from
the level of the common femoral vein through the popliteal and
proximal calf veins.

[Series 1: us extrem low venous*left* · 0.07mm/px · 14 of 51 slices shown]
[im 1/51]
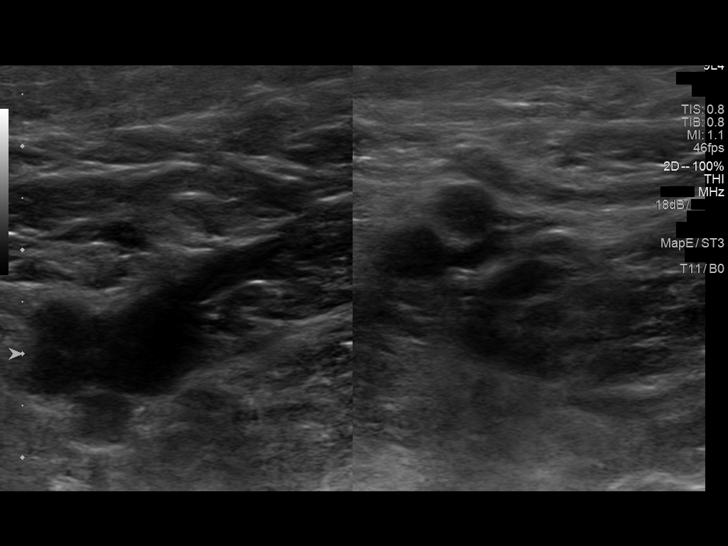
[im 5/51]
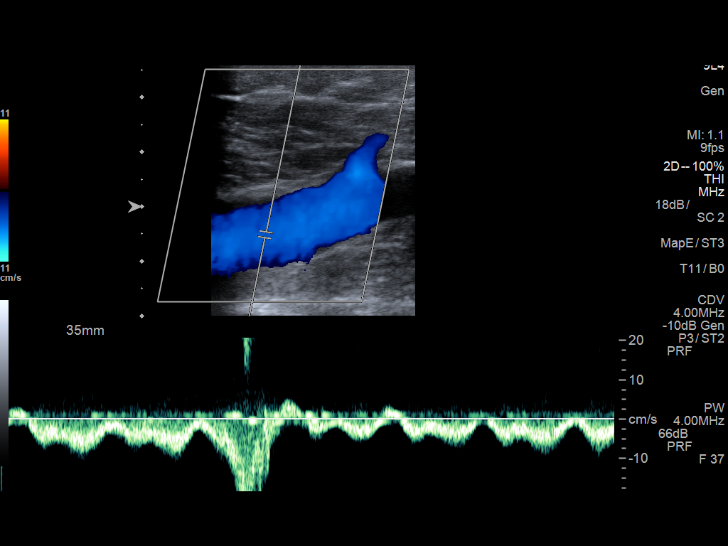
[im 9/51]
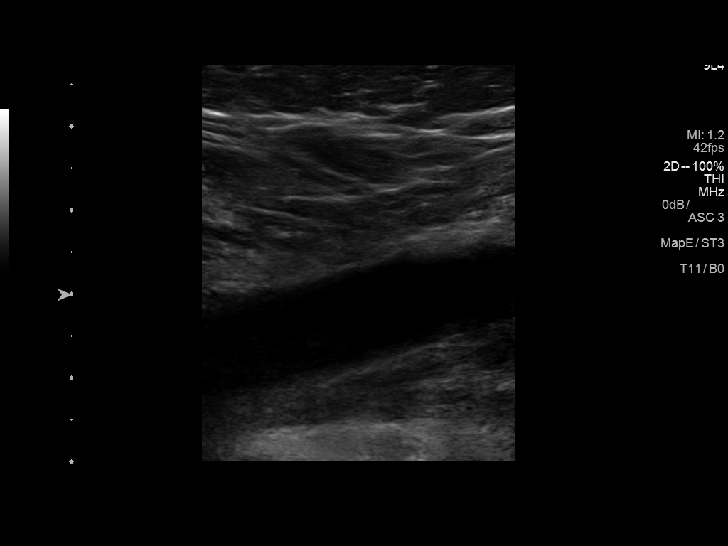
[im 14/51]
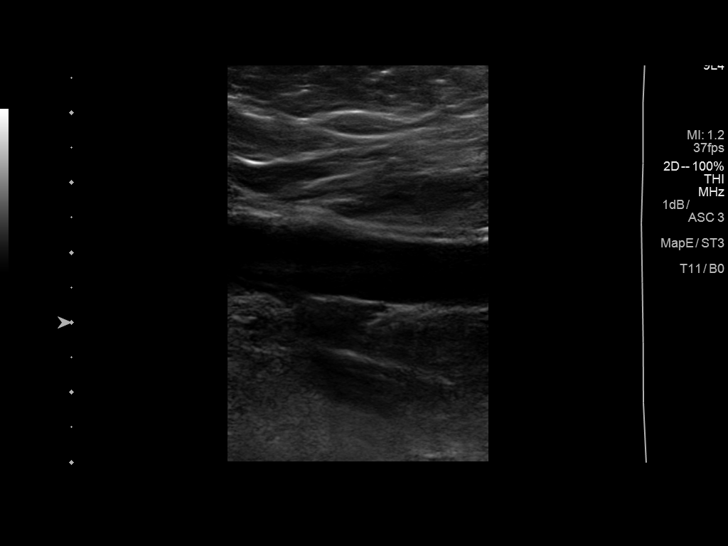
[im 16/51]
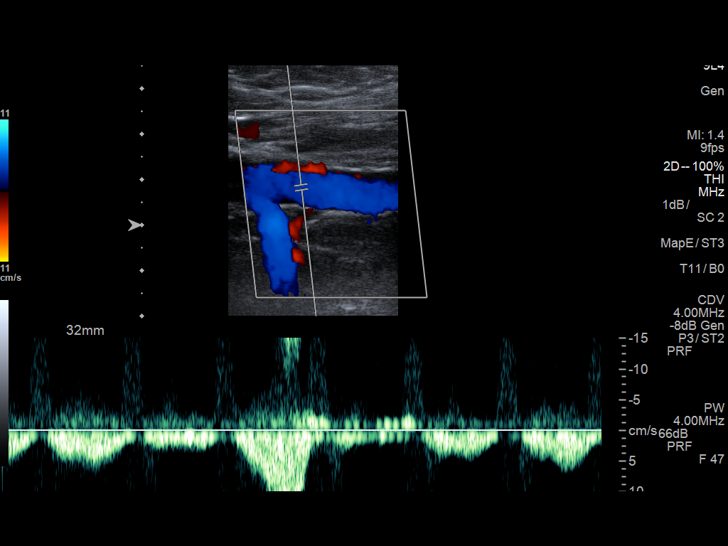
[im 20/51]
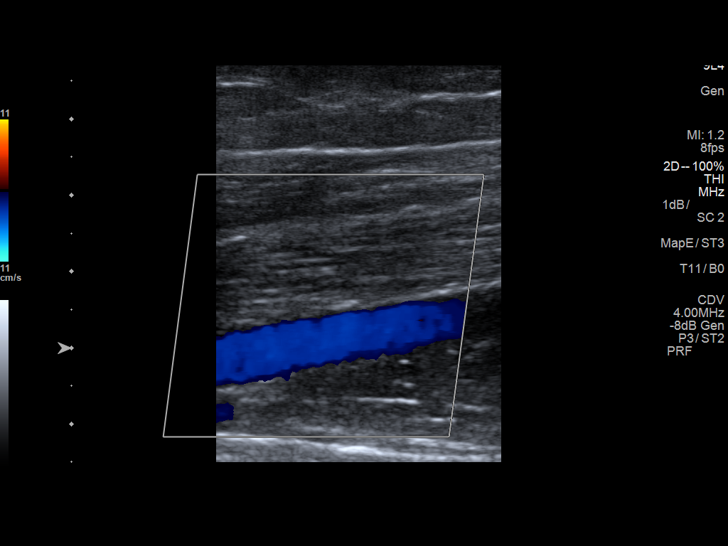
[im 24/51]
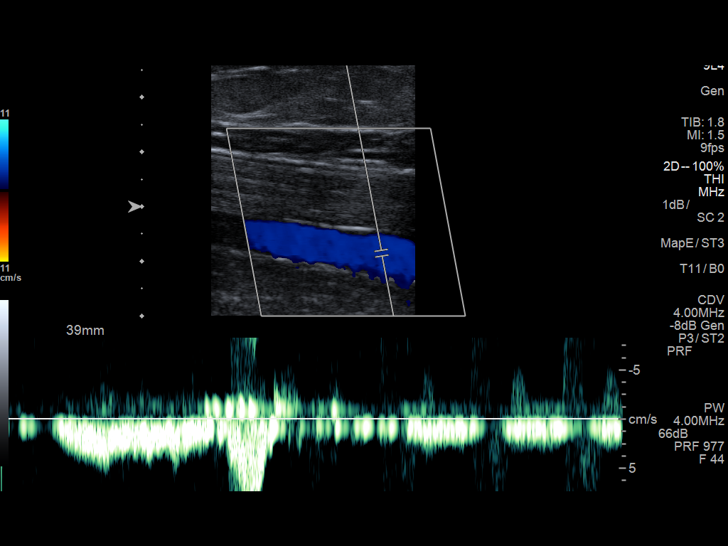
[im 27/51]
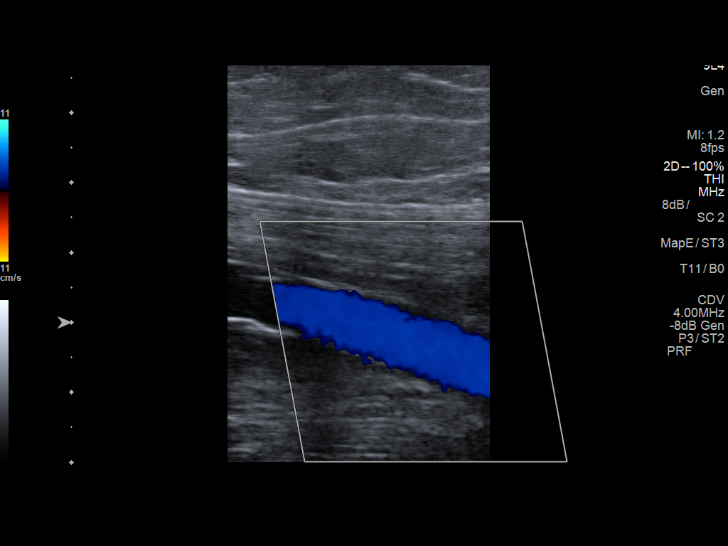
[im 31/51]
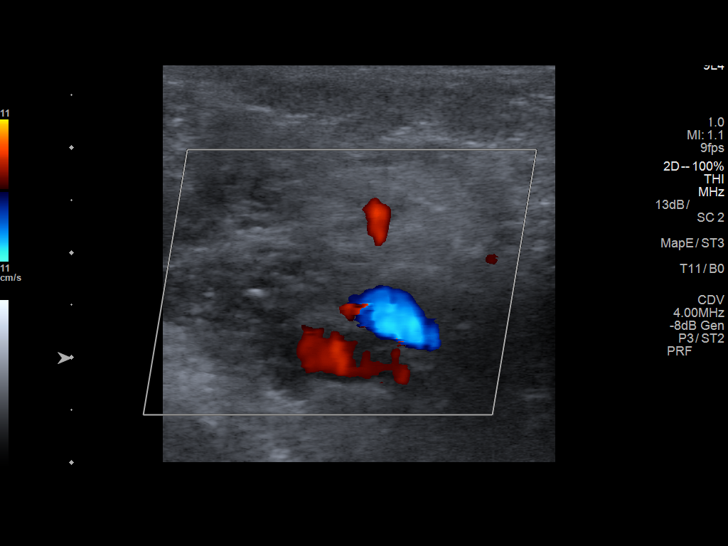
[im 35/51]
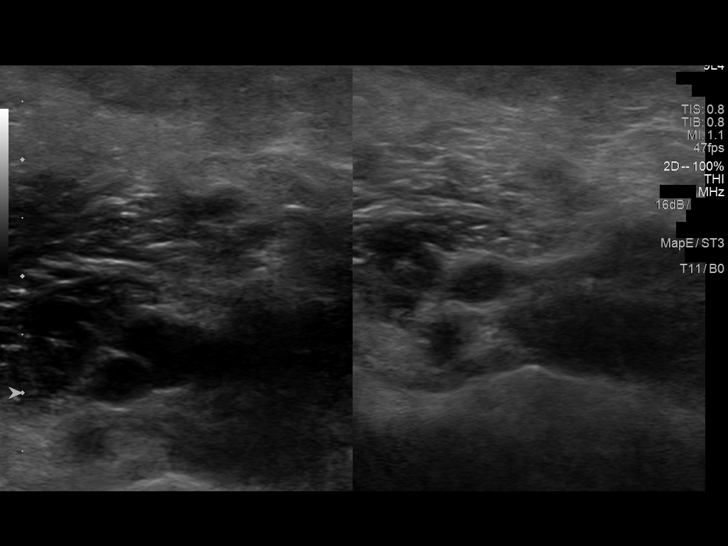
[im 40/51]
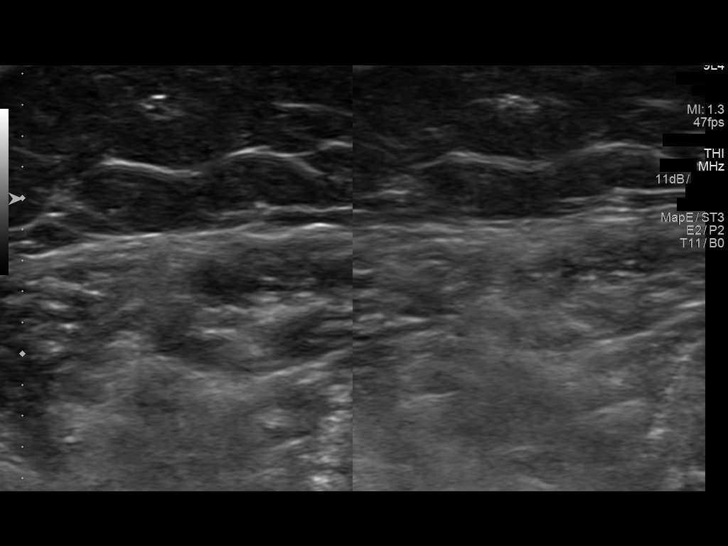
[im 42/51]
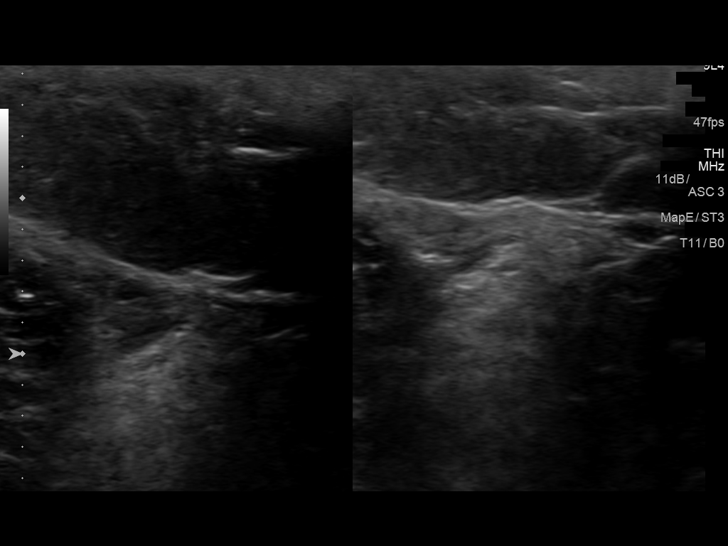
[im 46/51]
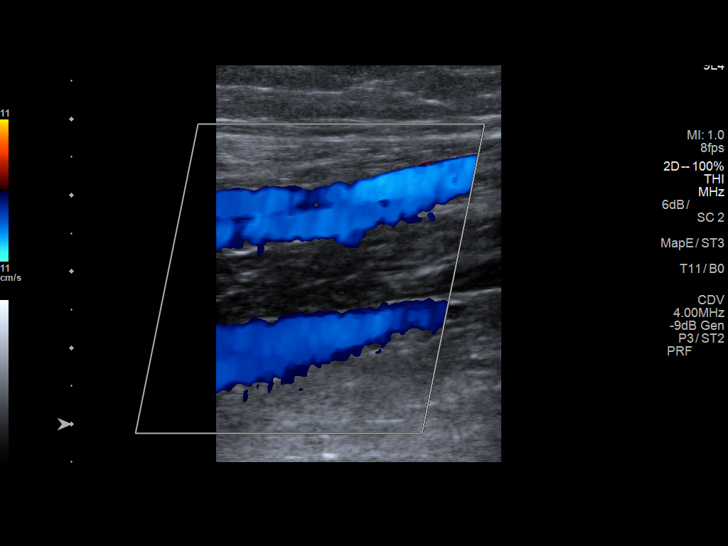
[im 51/51]
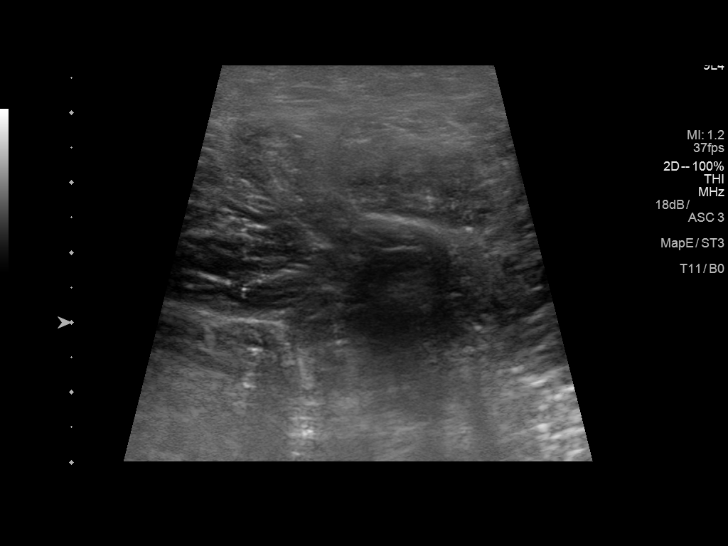

[14 of 24 positions shown; findings below may reference images not displayed]

FINDINGS: Normal compressibility of the common femoral, superficial femoral,
and popliteal veins, as well as the proximal calf veins. No filling
defects to suggest DVT on grayscale or color Doppler imaging.
Doppler waveforms show normal direction of venous flow, normal
respiratory phasicity and response to augmentation. Survey views of
the contralateral common femoral vein are unremarkable.
IMPRESSION: No evidence of left lower extremity deep vein thrombosis.

## 2019-09-26 ENCOUNTER — Other Ambulatory Visit: Payer: Self-pay | Admitting: Neurology

## 2019-10-02 ENCOUNTER — Ambulatory Visit (HOSPITAL_COMMUNITY): Payer: 59 | Admitting: Psychiatry

## 2019-10-16 ENCOUNTER — Ambulatory Visit: Payer: 59 | Admitting: Psychiatry

## 2019-10-19 ENCOUNTER — Ambulatory Visit (INDEPENDENT_AMBULATORY_CARE_PROVIDER_SITE_OTHER): Payer: Medicare Other | Admitting: Family Medicine

## 2019-10-19 ENCOUNTER — Encounter: Payer: Self-pay | Admitting: Family Medicine

## 2019-10-19 ENCOUNTER — Other Ambulatory Visit: Payer: Self-pay

## 2019-10-19 DIAGNOSIS — N3090 Cystitis, unspecified without hematuria: Secondary | ICD-10-CM

## 2019-10-19 MED ORDER — CIPROFLOXACIN HCL 500 MG PO TABS: 500.0000 mg | ORAL_TABLET | Freq: Two times a day (BID) | ORAL | 0 refills | Status: DC

## 2019-10-19 NOTE — Progress Notes (Signed)
No chief complaint on file.   HPI  Patient presents today for burning with urination and frequency for several days. Denies fever . No flank pain. No nausea, vomiting.   PMH: Smoking status noted ROS: Review of Systems  Constitutional: Negative for chills, diaphoresis and fever.  HENT: Negative for congestion.   Eyes: Negative for visual disturbance.  Respiratory: Negative for cough and shortness of breath.   Cardiovascular: Negative for chest pain and palpitations.  Gastrointestinal: Negative for constipation, diarrhea and nausea.  Genitourinary: Positive for dysuria, frequency and urgency. Negative for decreased urine volume, flank pain, hematuria, menstrual problem and pelvic pain.  Musculoskeletal: Negative for arthralgias and joint swelling.  Skin: Negative for rash.  Neurological: Negative for dizziness and numbness.     Exam deferred. Pt. Harboring due to COVID 19. Phone visit performed.   Assessment and plan:  1. Cystitis     Meds ordered this encounter  Medications  . ciprofloxacin (CIPRO) 500 MG tablet    Sig: Take 1 tablet (500 mg total) by mouth 2 (two) times daily.    Dispense:  14 tablet    Refill:  0    Virtual Visit via telephone Note  I discussed the limitations, risks, security and privacy concerns of performing an evaluation and management service by telephone and the availability of in person appointments. I also discussed with the patient that there may be a patient responsible charge related to this service. The patient expressed understanding and agreed to proceed. Pt. Is at home. Dr. Darlyn Read is in his office.  Follow Up Instructions:   I discussed the assessment and treatment plan with the patient. The patient was provided an opportunity to ask questions and all were answered. The patient agreed with the plan and demonstrated an understanding of the instructions.   The patient was advised to call back or seek an in-person evaluation if the  symptoms worsen or if the condition fails to improve as anticipated.  Total minutes including chart review and phone contact time: 9   Follow up as needed.  Mechele Claude, MD

## 2019-10-20 ENCOUNTER — Other Ambulatory Visit: Payer: Self-pay | Admitting: Neurology

## 2019-11-07 ENCOUNTER — Ambulatory Visit: Payer: Medicare Other | Admitting: Psychiatry

## 2019-11-14 NOTE — Progress Notes (Signed)
Virtual Visit via Video Note  I connected with Samantha Dougherty on 11/19/19 at  4:00 PM EST by a video enabled telemedicine application and verified that I am speaking with the correct person using two identifiers.   I discussed the limitations of evaluation and management by telemedicine and the availability of in person appointments. The patient expressed understanding and agreed to proceed.    I discussed the assessment and treatment plan with the patient. The patient was provided an opportunity to ask questions and all were answered. The patient agreed with the plan and demonstrated an understanding of the instructions.   The patient was advised to call back or seek an in-person evaluation if the symptoms worsen or if the condition fails to improve as anticipated.  I provided 25 minutes of non-face-to-face time during this encounter.   Neysa Hotter, MD    [facility] MD/PA/NP OP Progress Note  11/19/2019 4:27 PM Samantha Dougherty  MRN:  [id number]  Chief Complaint:  Chief Complaint    Depression; Anxiety; Follow-up     HPI:  - Per chart review, she was seen by Dr. Avelino Leeds at [facility]. Lexapro was switched to duloxetine.   This is a follow-up appointment for depression and anxiety.  She states that things has been rough.  She has been having "generalized and social anxiety."  On further elaboration, she states that she has been stressed due to not being able to have her own space.  She and her family continues to live at her aunt's while trying to find a place to live. She states that although she is naturally having a lot of anxiety, she believes it has been getting worse. She was seen by neuropsychologist at [facility], and duloxetine was started. She finds it more helpful for anxiety. She attributes headache (which she claimed when she tried duloxetine in the past) to blood thinner which she used to take together. She complains of significant anhedonia and low motivation.  She has not  played basketball as much compared to before.  She tends to spend time in the bed.  She has initial insomnia.  She has fair concentration.  She has fair appetite.  She denies SI.  She feels anxious and tense.  She has occasional panic attacks, a few times per week.  She takes Xanax total of 2 mg per day. She agrees to limit it to 1.5 mg per day.   Discussed with her mother with patient consent.  Her mother states that lamotrigine may need to be prescribed by psychiatry in the future as the neurologist thinks that it is used more for treatment of her mood symptoms rather than seizure. Samantha Dougherty will be seen by neurologist in a few weeks. Her mother agrees to send their record to [place] after this visit.  Visit Diagnosis:    ICD-10-CM   1. MDD (major depressive disorder), recurrent episode, mild (HCC)  F33.0   2. GAD (generalized anxiety disorder)  F41.1     Past Psychiatric History: Please see initial evaluation for full details. I have reviewed the history. No updates at this time.     Past Medical History:  Past Medical History:  Diagnosis Date  . Anti-phospholipid antibody syndrome (HCC)   . Anxiety   . Asthma   . Bipolar 2 disorder (HCC)   . Conversion disorder   . Depression   . DVT (deep venous thrombosis) (HCC)    LLE  . Fibromyalgia   . Gallbladder polyp   .  H. pylori infection   . IBS (irritable bowel syndrome)   . Legal blindness    Right eye  . Migraine   . Mood disorder ([place])   . Psychogenic nonepileptic seizure   . Urinary tract infection     Past Surgical History:  Procedure Laterality Date  . COLONOSCOPY WITH ESOPHAGOGASTRODUODENOSCOPY (EGD)    . HYMENECTOMY  2016    Family Psychiatric History: Please see initial evaluation for full details. I have reviewed the history. No updates at this time.     Family History:  Family History  Problem Relation Age of Onset  . Anxiety disorder Mother   . Depression Mother   . Hyperlipidemia Mother   . Bipolar disorder  Mother   . Thyroid disease Maternal Grandmother   . Deep vein thrombosis Maternal Grandmother   . Thyroid disease Maternal Grandfather   . Anxiety disorder Father   . Depression Father   . Autism Sister   . Other Sister        [place] syndrome  . Factor V Leiden deficiency Maternal Aunt   . Breast cancer Maternal Aunt   . Bipolar disorder Maternal Aunt   . Diabetes Paternal Grandfather   . Deep vein thrombosis Maternal Aunt   . Bipolar disorder Maternal Aunt   . Colon cancer Neg Hx   . Esophageal cancer Neg Hx   . Rectal cancer Neg Hx     Social History:  Social History   Socioeconomic History  . Marital status: Single    Spouse name: Not on file  . Number of children: 0  . Years of education: Not on file  . Highest education level: Not on file  Occupational History  . Occupation: Ship broker  Tobacco Use  . Smoking status: Never Smoker  . Smokeless tobacco: Never Used  Substance and Sexual Activity  . Alcohol use: Never  . Drug use: Never  . Sexual activity: Yes    Birth control/protection: None, Condom  Other Topics Concern  . Not on file  Social History Narrative   From [place].  Was receiving medical care at [facility].  Resides with mother.   Social Determinants of Health   Financial Resource Strain:   . Difficulty of Paying Living Expenses: Not on file  Food Insecurity:   . Worried About Charity fundraiser in the Last Year: Not on file  . Ran Out of Food in the Last Year: Not on file  Transportation Needs:   . Lack of Transportation (Medical): Not on file  . Lack of Transportation (Non-Medical): Not on file  Physical Activity:   . Days of Exercise per Week: Not on file  . Minutes of Exercise per Session: Not on file  Stress:   . Feeling of Stress : Not on file  Social Connections:   . Frequency of Communication with Friends and Family: Not on file  . Frequency of Social Gatherings with Friends and Family: Not on file  . Attends Religious Services: Not on file   . Active Member of Clubs or Organizations: Not on file  . Attends Archivist Meetings: Not on file  . Marital Status: Not on file    Allergies:  Allergies  Allergen Reactions  . Sulfa Antibiotics Anaphylaxis  . Amitriptyline     Hallucinations    Metabolic Disorder Labs: Lab Results  Component Value Date   HGBA1C 5.2 08/30/2018   No results found for: PROLACTIN Lab Results  Component Value Date   CHOL  171 04/14/2018   TRIG 140 04/14/2018   HDL 50 04/14/2018   CHOLHDL 3.4 04/14/2018   LDLCALC 93 04/14/2018   Lab Results  Component Value Date   TSH 0.737 08/30/2018    Therapeutic Level Labs: No results found for: LITHIUM No results found for: VALPROATE No components found for:  CBMZ  Current Medications: Current Outpatient Medications  Medication Sig Dispense Refill  . DULoxetine (CYMBALTA) 30 MG capsule Take 30 mg by mouth daily.    Marland Kitchen ALPRAZolam (XANAX) 0.5 MG tablet Take 1 tablet (0.5 mg total) by mouth 3 (three) times daily as needed for anxiety. 90 tablet 3  . Brexpiprazole (REXULTI) 0.5 MG TABS Take 1 tablet (0.5 mg total) by mouth every evening. 90 tablet 1  . ciprofloxacin (CIPRO) 500 MG tablet Take 1 tablet (500 mg total) by mouth 2 (two) times daily. 14 tablet 0  . hydroxypropyl methylcellulose / hypromellose (ISOPTO TEARS / GONIOVISC) 2.5 % ophthalmic solution Place 1 drop into both eyes 3 (three) times daily as needed for dry eyes.    Marland Kitchen lamoTRIgine (LAMICTAL) 100 MG tablet Take 1 tablet (100 mg total) by mouth 2 (two) times daily. Please call [phone] to schedule yearly appt or may request from PCP. 60 tablet 1   No current facility-administered medications for this visit.     Musculoskeletal: Strength & Muscle Tone: N/A Gait & Station: N/A Patient leans: N/A  Psychiatric Specialty Exam: Review of Systems  Psychiatric/Behavioral: Positive for sleep disturbance. Negative for agitation, behavioral problems, confusion, decreased  concentration, dysphoric mood, hallucinations, self-injury and suicidal ideas. The patient is nervous/anxious. The patient is not hyperactive.   All other systems reviewed and are negative.   There were no vitals taken for this visit.There is no height or weight on file to calculate BMI.  General Appearance: Fairly Groomed  Eye Contact:  Good  Speech:  Clear and Coherent  Volume:  Normal  Mood:  Anxious and Depressed  Affect:  Appropriate, Congruent and slightly down  Thought Process:  Coherent  Orientation:  Full (Time, Place, and Person)  Thought Content: Logical   Suicidal Thoughts:  No  Homicidal Thoughts:  No  Memory:  Immediate;   Good  Judgement:  Good  Insight:  Fair  Psychomotor Activity:  Normal  Concentration:  Concentration: Good and Attention Span: Good  Recall:  Good  Fund of Knowledge: Good  Language: Good  Akathisia:  No  Handed:  Right  AIMS (if indicated): not done  Assets:  Communication Skills Desire for Improvement  ADL's:  Intact  Cognition: WNL  Sleep:  Poor   Screenings: PHQ2-9     Office Visit from 10/19/2018 in [facility] Office Visit from 08/30/2018 in [facility] Office Visit from 08/22/2018 in [facility] Office Visit from 07/17/2018 in [facility] Office Visit from 04/14/2018 in [facility]  PHQ-2 Total Score  3  2  3  6  1   PHQ-9 Total Score  15  12  13  20   --       Assessment and Plan:  is a 24 y.o. 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  She also does have others physical condition, which includes seizure-like episodes and myalgia.  She was recently seen by neuropsychologist at outside of the facility, and was started on duloxetine in replace of Lexapro.  Will continue current dose of duloxetine at this time to see if it exerts its full benefit for depression.  Will consider up titration in the future to optimize its benefit.  Will continue Rexulti as adjunctive treatment for depression.  Discussed potential metabolic side effect.  Will continue Xanax as needed for anxiety.  Discussed risk of dependence and oversedation. Discussed behavioral activation. She will greatly benefit from CBT; she has a plan to schedule for therapy.    Plan I have reviewed and updated plans as below 1. Continue duloxetine 30 mg daily  2.Continue Rexulti 0.5 mg daily 3. ContinueXanax 0.5 mgtwo tothree times  4. Obtain record from Dr.David Easterday 5. Next appointment: 4/5 at 4 PM for 30 mins, video  Past trials of medication:Lexapro,duloxetine (headache),elavil (hallucinations), Rexulti, Abilify, Xanax, lamotrigine, temazepam  The patient demonstrates the following risk factors for suicide: Chronic risk factors for suicide include:psychiatric disorder ofdepressionand chronic pain. Acute risk factorsfor suicide include: unemployment and social withdrawal/isolation. Protective factorsfor this patient include: positive social support, coping skills and hope for the future. Considering these factors, the overall suicide risk at this point appears to below. Patientisappropriate for outpatient follow up.  Neysa Hotter, MD 11/19/2019, 4:27 PM

## 2019-11-19 ENCOUNTER — Ambulatory Visit (INDEPENDENT_AMBULATORY_CARE_PROVIDER_SITE_OTHER): Payer: Medicare Other | Admitting: Psychiatry

## 2019-11-19 ENCOUNTER — Other Ambulatory Visit: Payer: Self-pay

## 2019-11-19 ENCOUNTER — Encounter (HOSPITAL_COMMUNITY): Payer: Self-pay | Admitting: Psychiatry

## 2019-11-19 DIAGNOSIS — F330 Major depressive disorder, recurrent, mild: Secondary | ICD-10-CM | POA: Diagnosis not present

## 2019-11-19 DIAGNOSIS — F411 Generalized anxiety disorder: Secondary | ICD-10-CM

## 2019-11-19 NOTE — Patient Instructions (Signed)
1. Continue duloxetine 30 mg daily  2.Continue Rexulti 0.5 mg daily 3. ContinueXanax 0.5 mgtwo tothree times  4. Obtain record from Dr.David Easterday 5. Next appointment: 4/5 at 4 PM

## 2019-11-20 ENCOUNTER — Other Ambulatory Visit (HOSPITAL_COMMUNITY): Payer: Self-pay | Admitting: Psychiatry

## 2019-11-20 MED ORDER — ALPRAZOLAM 0.5 MG PO TABS: 0.5000 mg | ORAL_TABLET | Freq: Three times a day (TID) | ORAL | 1 refills | Status: DC | PRN

## 2019-12-18 NOTE — Progress Notes (Deleted)
[facility] MD/PA/NP OP Progress Note  12/18/2019 9:47 AM Samantha Dougherty  MRN:  [id number]  Chief Complaint:  HPI: *** Visit Diagnosis: No diagnosis found.  Past Psychiatric History: ***  Past Medical History:  Past Medical History:  Diagnosis Date  . Anti-phospholipid antibody syndrome (HCC)   . Anxiety   . Asthma   . Bipolar 2 disorder (HCC)   . Conversion disorder   . Depression   . DVT (deep venous thrombosis) (HCC)    LLE  . Fibromyalgia   . Gallbladder polyp   . H. pylori infection   . IBS (irritable bowel syndrome)   . Legal blindness    Right eye  . Migraine   . Mood disorder (HCC)   . Psychogenic nonepileptic seizure   . Urinary tract infection     Past Surgical History:  Procedure Laterality Date  . COLONOSCOPY WITH ESOPHAGOGASTRODUODENOSCOPY (EGD)    . HYMENECTOMY  2016    Family Psychiatric History: ***  Family History:  Family History  Problem Relation Age of Onset  . Anxiety disorder Mother   . Depression Mother   . Hyperlipidemia Mother   . Bipolar disorder Mother   . Thyroid disease Maternal Grandmother   . Deep vein thrombosis Maternal Grandmother   . Thyroid disease Maternal Grandfather   . Anxiety disorder Father   . Depression Father   . Autism Sister   . Other Sister        [place] syndrome  . Factor V Leiden deficiency Maternal Aunt   . Breast cancer Maternal Aunt   . Bipolar disorder Maternal Aunt   . Diabetes Paternal Grandfather   . Deep vein thrombosis Maternal Aunt   . Bipolar disorder Maternal Aunt   . Colon cancer Neg Hx   . Esophageal cancer Neg Hx   . Rectal cancer Neg Hx     Social History:  Social History   Socioeconomic History  . Marital status: Single    Spouse name: Not on file  . Number of children: 0  . Years of education: Not on file  . Highest education level: Not on file  Occupational History  . Occupation: Consulting civil engineer  Tobacco Use  . Smoking status: Never Smoker  . Smokeless tobacco: Never Used  Substance and  Sexual Activity  . Alcohol use: Never  . Drug use: Never  . Sexual activity: Yes    Birth control/protection: None, Condom  Other Topics Concern  . Not on file  Social History Narrative   From [place].  Was receiving medical care at [facility].  Resides with mother.   Social Determinants of Health   Financial Resource Strain:   . Difficulty of Paying Living Expenses:   Food Insecurity:   . Worried About Programme researcher, broadcasting/film/video in the Last Year:   . Barista in the Last Year:   Transportation Needs:   . Freight forwarder (Medical):   Marland Kitchen Lack of Transportation (Non-Medical):   Physical Activity:   . Days of Exercise per Week:   . Minutes of Exercise per Session:   Stress:   . Feeling of Stress :   Social Connections:   . Frequency of Communication with Friends and Family:   . Frequency of Social Gatherings with Friends and Family:   . Attends Religious Services:   . Active Member of Clubs or Organizations:   . Attends Banker Meetings:   Marland Kitchen Marital Status:     Allergies:  Allergies  Allergen Reactions  . Sulfa Antibiotics Anaphylaxis  . Amitriptyline     Hallucinations    Metabolic Disorder Labs: Lab Results  Component Value Date   HGBA1C 5.2 08/30/2018   No results found for: PROLACTIN Lab Results  Component Value Date   CHOL 171 04/14/2018   TRIG 140 04/14/2018   HDL 50 04/14/2018   CHOLHDL 3.4 04/14/2018   LDLCALC 93 04/14/2018   Lab Results  Component Value Date   TSH 0.737 08/30/2018    Therapeutic Level Labs: No results found for: LITHIUM No results found for: VALPROATE No components found for:  CBMZ  Current Medications: Current Outpatient Medications  Medication Sig Dispense Refill  . ALPRAZolam (XANAX) 0.5 MG tablet Take 1 tablet (0.5 mg total) by mouth 3 (three) times daily as needed for anxiety. 90 tablet 1  . Brexpiprazole (REXULTI) 0.5 MG TABS Take 1 tablet (0.5 mg total) by mouth every evening. 90 tablet 1  .  ciprofloxacin (CIPRO) 500 MG tablet Take 1 tablet (500 mg total) by mouth 2 (two) times daily. 14 tablet 0  . DULoxetine (CYMBALTA) 30 MG capsule Take 30 mg by mouth daily.    . hydroxypropyl methylcellulose / hypromellose (ISOPTO TEARS / GONIOVISC) 2.5 % ophthalmic solution Place 1 drop into both eyes 3 (three) times daily as needed for dry eyes.    Marland Kitchen lamoTRIgine (LAMICTAL) 100 MG tablet Take 1 tablet (100 mg total) by mouth 2 (two) times daily. Please call [phone] to schedule yearly appt or may request from PCP. 60 tablet 1   No current facility-administered medications for this visit.     Musculoskeletal: Strength & Muscle Tone: {desc; muscle tone:32375} Gait & Station: {PE GAIT ED GDJM:42683} Patient leans: {Patient Leans:21022755}  Psychiatric Specialty Exam: Review of Systems  There were no vitals taken for this visit.There is no height or weight on file to calculate BMI.  General Appearance: {Appearance:22683}  Eye Contact:  {BHH EYE CONTACT:22684}  Speech:  {Speech:22685}  Volume:  {Volume (PAA):22686}  Mood:  {BHH MOOD:22306}  Affect:  {Affect (PAA):22687}  Thought Process:  {Thought Process (PAA):22688}  Orientation:  {BHH ORIENTATION (PAA):22689}  Thought Content: {Thought Content:22690}   Suicidal Thoughts:  {ST/HT (PAA):22692}  Homicidal Thoughts:  {ST/HT (PAA):22692}  Memory:  {BHH MEMORY:22881}  Judgement:  {Judgement (PAA):22694}  Insight:  {Insight (PAA):22695}  Psychomotor Activity:  {Psychomotor (PAA):22696}  Concentration:  {Concentration:21399}  Recall:  {BHH GOOD/FAIR/POOR:22877}  Fund of Knowledge: {BHH GOOD/FAIR/POOR:22877}  Language: {BHH GOOD/FAIR/POOR:22877}  Akathisia:  {BHH YES OR NO:22294}  Handed:  {Handed:22697}  AIMS (if indicated): {Desc; done/not:10129}  Assets:  {Assets (PAA):22698}  ADL's:  {BHH MHD'Q:22297}  Cognition: {chl bhh cognition:304700322}  Sleep:  {BHH GOOD/FAIR/POOR:22877}   Screenings: PHQ2-9     Office Visit from  10/19/2018 in [place] Office Visit from 08/30/2018 in [place] Visit from 08/22/2018 in [place] Visit from 07/17/2018 in [place] Visit from 04/14/2018 in [place]  PHQ-2 Total Score  3  2  3  6  1   PHQ-9 Total Score  15  12  13  20   --       Assessment and Plan: ***   Norman Clay, MD 12/18/2019, 9:47 AM

## 2019-12-21 ENCOUNTER — Other Ambulatory Visit (HOSPITAL_COMMUNITY): Payer: Self-pay | Admitting: Psychiatry

## 2019-12-21 ENCOUNTER — Telehealth (HOSPITAL_COMMUNITY): Payer: Self-pay | Admitting: *Deleted

## 2019-12-21 MED ORDER — REXULTI 0.5 MG PO TABS: 0.5000 mg | ORAL_TABLET | Freq: Every evening | ORAL | 1 refills | Status: DC

## 2019-12-21 NOTE — Telephone Encounter (Signed)
Patient of  Dr Vanetta [facility]  faxed refill request  Brexpiprazole (REXULTI) 0.5 MG TABS 90 tablet

## 2019-12-21 NOTE — Telephone Encounter (Signed)
sent 

## 2019-12-24 ENCOUNTER — Other Ambulatory Visit: Payer: Self-pay

## 2019-12-24 ENCOUNTER — Ambulatory Visit (HOSPITAL_COMMUNITY): Payer: Medicare Other | Admitting: Psychiatry

## 2019-12-24 NOTE — Progress Notes (Signed)
Virtual Visit via Video Note  I connected with Samantha Dougherty on 12/26/19 at 11:30 AM EDT by a video enabled telemedicine application and verified that I am speaking with the correct person using two identifiers.   I discussed the limitations of evaluation and management by telemedicine and the availability of in person appointments. The patient expressed understanding and agreed to proceed.   I discussed the assessment and treatment plan with the patient. The patient was provided an opportunity to ask questions and all were answered. The patient agreed with the plan and demonstrated an understanding of the instructions.   The patient was advised to call back or seek an in-person evaluation if the symptoms worsen or if the condition fails to improve as anticipated.  I provided 20 minutes of non-face-to-face time during this encounter.   Samantha Hotter, MD     [facility] MD/PA/NP OP Progress Note  12/26/2019 12:04 PM Samantha Dougherty  MRN:  [id number]  Chief Complaint:  Chief Complaint    Anxiety; Depression; Follow-up     HPI:  This is a follow-up appointment for depression, anxiety and seizure-like episode.  She states that she has been struggling with anxiety and depression.  She discontinued duloxetine a few days ago because it caused her nightmares and headache.  Although she initially thought the medication has been helping her, she does not see any benefit from this medication anymore. She tends to "shut down" when she feels anxious.  She has occasional intense anxiety with chest pain.  It happens at home or in any place almost every day.  She continues to have seizure-like episode; she did not lose consciousness, feels shaky, vomits, which lasts for a few seconds. It occurs every day, a few times per day. She does not go outside as often as she knows her "body." She complains of migraine,  photosensitivity, muscle weakness, nausea. She has middle insomnia.  She feels fatigue.  She has anhedonia.   She has fair concentration.  She has occasional decreased appetite.  She denies SI.  She denies decreased need for sleep or euphoria.  She agrees to taper down lamotrigine.    Reviewed notes from [place] [place] [facility], which includes the following - Pt is a 24 y.o. 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 In regards to potential physical triggers, I believe her poorly controlled migraine headaches may also be a trigger and may improve with better headache control. Discussed with patient that given overall low suspicion for epilepsy as she has had extensive monitoring most recently in 2020 with recorded events and normal EEG off medications, the LTG should be managed by psychiatry as I am concerned that if I taper it off it could exacerbate mood symptoms (per outside neurology note in 06/2018- LTG was initially started by psychiatry)   Visit Diagnosis:    ICD-10-CM   1. Moderate episode of recurrent major depressive disorder (HCC)  F33.1   2. GAD (generalized anxiety disorder)  F41.1   3. Convulsions, unspecified convulsion type (HCC)  R56.9     Past Psychiatric History: Please see initial evaluation for full details. I have reviewed the history. No updates at this time.     Past Medical History:  Past Medical History:  Diagnosis Date  . Anti-phospholipid antibody syndrome (HCC)   . Anxiety   .  Asthma   . Bipolar 2 disorder (HCC)   . Conversion disorder   . Depression   . DVT (deep venous thrombosis) (HCC)    LLE  . Fibromyalgia   . Gallbladder polyp   . H. pylori infection   . IBS (irritable bowel syndrome)   . Legal blindness    Right eye  . Migraine   . Mood disorder (HCC)   . Psychogenic nonepileptic seizure   . Urinary tract infection     Past  Surgical History:  Procedure Laterality Date  . COLONOSCOPY WITH ESOPHAGOGASTRODUODENOSCOPY (EGD)    . HYMENECTOMY  2016    Family Psychiatric History: Please see initial evaluation for full details. I have reviewed the history. No updates at this time.     Family History:  Family History  Problem Relation Age of Onset  . Anxiety disorder Mother   . Depression Mother   . Hyperlipidemia Mother   . Bipolar disorder Mother   . Thyroid disease Maternal Grandmother   . Deep vein thrombosis Maternal Grandmother   . Thyroid disease Maternal Grandfather   . Anxiety disorder Father   . Depression Father   . Autism Sister   . Other Sister        [place] syndrome  . Factor V Leiden deficiency Maternal Aunt   . Breast cancer Maternal Aunt   . Bipolar disorder Maternal Aunt   . Diabetes Paternal Grandfather   . Deep vein thrombosis Maternal Aunt   . Bipolar disorder Maternal Aunt   . Colon cancer Neg Hx   . Esophageal cancer Neg Hx   . Rectal cancer Neg Hx     Social History:  Social History   Socioeconomic History  . Marital status: Single    Spouse name: Not on file  . Number of children: 0  . Years of education: Not on file  . Highest education level: Not on file  Occupational History  . Occupation: Consulting civil engineer  Tobacco Use  . Smoking status: Never Smoker  . Smokeless tobacco: Never Used  Substance and Sexual Activity  . Alcohol use: Never  . Drug use: Never  . Sexual activity: Yes    Birth control/protection: None, Condom  Other Topics Concern  . Not on file  Social History Narrative   From [place].  Was receiving medical care at [facility].  Resides with mother.   Social Determinants of Health   Financial Resource Strain:   . Difficulty of Paying Living Expenses:   Food Insecurity:   . Worried About Programme researcher, broadcasting/film/video in the Last Year:   . Barista in the Last Year:   Transportation Needs:   . Freight forwarder (Medical):   Marland Kitchen Lack of Transportation  (Non-Medical):   Physical Activity:   . Days of Exercise per Week:   . Minutes of Exercise per Session:   Stress:   . Feeling of Stress :   Social Connections:   . Frequency of Communication with Friends and Family:   . Frequency of Social Gatherings with Friends and Family:   . Attends Religious Services:   . Active Member of Clubs or Organizations:   . Attends Banker Meetings:   Marland Kitchen Marital Status:     Allergies:  Allergies  Allergen Reactions  . Sulfa Antibiotics Anaphylaxis  . Amitriptyline     Hallucinations    Metabolic Disorder Labs: Lab Results  Component Value Date   HGBA1C 5.2 08/30/2018   No results  found for: PROLACTIN Lab Results  Component Value Date   CHOL 171 04/14/2018   TRIG 140 04/14/2018   HDL 50 04/14/2018   CHOLHDL 3.4 04/14/2018   LDLCALC 93 04/14/2018   Lab Results  Component Value Date   TSH 0.737 08/30/2018    Therapeutic Level Labs: No results found for: LITHIUM No results found for: VALPROATE No components found for:  CBMZ  Current Medications: Current Outpatient Medications  Medication Sig Dispense Refill  . ALPRAZolam (XANAX) 0.5 MG tablet Take 1 tablet (0.5 mg total) by mouth 3 (three) times daily as needed for anxiety. 90 tablet 1  . Brexpiprazole (REXULTI) 0.5 MG TABS Take 1 tablet (0.5 mg total) by mouth every evening. 90 tablet 1  . ciprofloxacin (CIPRO) 500 MG tablet Take 1 tablet (500 mg total) by mouth 2 (two) times daily. 14 tablet 0  . hydroxypropyl methylcellulose / hypromellose (ISOPTO TEARS / GONIOVISC) 2.5 % ophthalmic solution Place 1 drop into both eyes 3 (three) times daily as needed for dry eyes.    Marland Kitchen lamoTRIgine (LAMICTAL) 100 MG tablet Take 1 tablet (100 mg total) by mouth 2 (two) times daily. Please call [phone] to schedule yearly appt or may request from PCP. 60 tablet 1  . lamoTRIgine (LAMICTAL) 100 MG tablet 100 mg daily, and 50 mg at night for two weeks, then 100 mg daily 60 tablet 0  .  venlafaxine XR (EFFEXOR-XR) 37.5 MG 24 hr capsule 37.5 mg daily for 7 days 7 capsule 0  . venlafaxine XR (EFFEXOR-XR) 75 MG 24 hr capsule 75 mg daily. Start after completing 37.5 mg daily for one week 30 capsule 1   No current facility-administered medications for this visit.     Musculoskeletal: Strength & Muscle Tone: N/A Gait & Station: N/A Patient leans: N/A  Psychiatric Specialty Exam: Review of Systems  Psychiatric/Behavioral: Positive for dysphoric mood and sleep disturbance. Negative for agitation, behavioral problems, confusion, decreased concentration, hallucinations, self-injury and suicidal ideas. The patient is nervous/anxious. The patient is not hyperactive.   All other systems reviewed and are negative.   There were no vitals taken for this visit.There is no height or weight on file to calculate BMI.  General Appearance: Fairly Groomed  Eye Contact:  Good  Speech:  Clear and Coherent  Volume:  Normal  Mood:  Anxious and Depressed  Affect:  Appropriate, Congruent and Depressed  Thought Process:  Coherent  Orientation:  Full (Time, Place, and Person)  Thought Content: Logical   Suicidal Thoughts:  No  Homicidal Thoughts:  No  Memory:  Immediate;   Good  Judgement:  Good  Insight:  Fair  Psychomotor Activity:  Normal  Concentration:  Concentration: Good and Attention Span: Good  Recall:  Good  Fund of Knowledge: Good  Language: Good  Akathisia:  No  Handed:  Right  AIMS (if indicated): not done  Assets:  Communication Skills Desire for Improvement  ADL's:  Intact  Cognition: WNL  Sleep:  Poor   Screenings: PHQ2-9     Office Visit from 10/19/2018 in [facility] Office Visit from 08/30/2018 in [facility] Office Visit from 08/22/2018 in [facility] Office Visit from 07/17/2018 in [facility] Office Visit from 04/14/2018 in [facility]  PHQ-2 Total Score  3  2  3   6  1   PHQ-9 Total Score  15  12  13  20   --       Assessment and  Plan:  Samantha Dougherty is a 24 y.o. 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  Psychosocial stressors includes demoralization due to her physical condition, which includes seizure-like episodes, myalgia, and headache.  She self discontinued duloxetine.  Discussed importance of medication adherence, and reaching out to the clinic before self adjusting the medication.  Will start venlafaxine to target depression and anxiety.  Discussed potential risk of headache.  We will continue Rexulti as adjunctive treatment for depression.  Discussed potential metabolic side effect.  Will continue Xanax as needed for anxiety.  Discussed risk for dependence and oversedation. Noted that she does have somatic symptoms, which includes seizure like episode as below. She will greatly benefit from CBT.  She has a plan to schedule for therapy.   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  Will taper down lamotrigine to avoid polypharmacy.  She is in agreement with plans.   Plan 1. Start venlafaxine 37.5 mg daily for a week, 75 mg daily  2. Continue Rexulti 0.5 mg daily 3. Decrease lamotrigine 100 mg daily, 50 mg at night for two weeks, then lamotrigine 100 mg daily  4. Continue Xanax 0.5 mg twice to three times a day as needed for anxiety 4. Obtain record from Dr.David Easterday- pending 5. Next appointment: 5/24 at 3:50 for 30 mins, video  Past trials  of medication:sertraline,Lexapro,duloxetine (headache, nightmares),elavil (hallucinations), Rexulti, Abilify, Xanax, lamotrigine, temazepam  The patient demonstrates the following risk factors for suicide: Chronic risk factors for suicide include:psychiatric disorder ofdepressionand chronic pain. Acute risk factorsfor suicide include: unemployment and social withdrawal/isolation. Protective factorsfor this patient include: positive social support, coping skills and hope for the future. Considering these factors, the overall suicide risk at this point appears to below. Patientisappropriate for outpatient follow up.  Norman Clay, MD 12/26/2019, 12:04 PM

## 2019-12-26 ENCOUNTER — Other Ambulatory Visit: Payer: Self-pay

## 2019-12-26 ENCOUNTER — Ambulatory Visit (INDEPENDENT_AMBULATORY_CARE_PROVIDER_SITE_OTHER): Payer: Medicare Other | Admitting: Psychiatry

## 2019-12-26 ENCOUNTER — Encounter (HOSPITAL_COMMUNITY): Payer: Self-pay | Admitting: Psychiatry

## 2019-12-26 DIAGNOSIS — R569 Unspecified convulsions: Secondary | ICD-10-CM | POA: Diagnosis not present

## 2019-12-26 DIAGNOSIS — F411 Generalized anxiety disorder: Secondary | ICD-10-CM | POA: Diagnosis not present

## 2019-12-26 DIAGNOSIS — F331 Major depressive disorder, recurrent, moderate: Secondary | ICD-10-CM

## 2019-12-26 MED ORDER — VENLAFAXINE HCL ER 37.5 MG PO CP24: ORAL_CAPSULE | ORAL | 0 refills | Status: DC

## 2019-12-26 MED ORDER — LAMOTRIGINE 100 MG PO TABS: ORAL_TABLET | ORAL | 0 refills | Status: DC

## 2019-12-26 MED ORDER — VENLAFAXINE HCL ER 75 MG PO CP24: ORAL_CAPSULE | ORAL | 1 refills | Status: DC

## 2019-12-26 NOTE — Patient Instructions (Signed)
1. Start venlafaxine 37.5 mg daily for a week, 75 mg daily  2. Continue Rexulti 0.5 mg daily 3. Decrease lamotrigine 100 mg daily, 50 mg at night for two weeks, then lamotrigine 100 mg daily  4. Continue Xanax 0.5 mg twice to three times a day as needed for anxiety 5. Next appointment: 5/24 at 3:50

## 2020-02-05 ENCOUNTER — Other Ambulatory Visit (HOSPITAL_COMMUNITY): Payer: Self-pay | Admitting: Psychiatry

## 2020-02-05 ENCOUNTER — Telehealth (HOSPITAL_COMMUNITY): Payer: Self-pay | Admitting: Psychiatry

## 2020-02-05 MED ORDER — LAMOTRIGINE 25 MG PO TABS: 50.0000 mg | ORAL_TABLET | Freq: Every day | ORAL | 0 refills | Status: DC

## 2020-02-05 NOTE — Telephone Encounter (Signed)
Left the voice message to contact the office. If she calls [place] back, please verify lamotrigine dose she is taking now to see if she needs refill.

## 2020-02-05 NOTE — Progress Notes (Signed)
Virtual Visit via Video Note  I connected with Samantha Dougherty on 02/11/20 at  3:50 PM EDT by a video enabled telemedicine application and verified that I am speaking with the correct person using two identifiers.   I discussed the limitations of evaluation and management by telemedicine and the availability of in person appointments. The patient expressed understanding and agreed to proceed.    I discussed the assessment and treatment plan with the patient. The patient was provided an opportunity to ask questions and all were answered. The patient agreed with the plan and demonstrated an understanding of the instructions.   The patient was advised to call back or seek an in-person evaluation if the symptoms worsen or if the condition fails to improve as anticipated.  I provided 20 minutes of non-face-to-face time during this encounter.  Location: patient- car, provider- office    Norman Clay, MD     [facility] MD/PA/NP OP Progress Note  02/11/2020 4:26 PM Samantha Dougherty  MRN:  [id number]  Chief Complaint:  Chief Complaint    Depression; Anxiety; Follow-up     HPI:  Per chart review, she was seen by Dr. 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  - continue LTG 100mg  bid and advising discussing with psychiatry whether should continue, will obtain ECG out of an overabundance of caution given its potential class 1b antiarrhythmic properties  - regardless of etiology of symptoms advised patient to avoid driving until no events for at least 6 mo and continue precautions: no swimming alone, avoid elevated heights, take shower instead of bath, use burners in back of stove instead of front, etc - Referral for Botox as she has tried and failed TPM, GBP,TCA, beta blocker - RTC In 4 mo    This is a follow-up appointment for depression and anxiety.  She states that she continues to struggle with anxiety.  She tends to get overwhelmed.  She feels stressed by living with other  family members, stating that they have different mindset.  She will move to another place with her entire family in a month  (her parents and sister.)  She states that she has had severe constipation since starting venlafaxine.  She would like her medication to be changed.  She tried sertraline up to 50 mg, and fluoxetine up to 20 mg. She is amenable to try sertraline with plan to try higher dose.  She has not played basketball due to pain.  She has strong preference to stay on lamotrigine 100 mg twice a day. She believes that it has helped her not feeling upset or crying. She had a seizure like episode the day before yesterday. She will have visit at [facility] for second opinion for seizure.  She has middle insomnia.  She feels fatigue.  She has fair concentration.  She denies SI. She feels anxious, tense. She denies irritability.  She has panic attacks every other day.   Visit Diagnosis:    ICD-10-CM   1. GAD (generalized anxiety disorder)  F41.1   2. Mild episode of recurrent major depressive disorder (HCC)  F33.0     Past Psychiatric History: Please see initial evaluation for full details. I have reviewed the history. No updates at this time.     Past Medical History:  Past Medical History:  Diagnosis Date  . Anti-phospholipid antibody syndrome (HCC)   . Anxiety   . Asthma   . Bipolar 2 disorder ([place])   . Conversion disorder   .  Depression   . DVT (deep venous thrombosis) (HCC)    LLE  . Fibromyalgia   . Gallbladder polyp   . H. pylori infection   . IBS (irritable bowel syndrome)   . Legal blindness    Right eye  . Migraine   . Mood disorder (HCC)   . Psychogenic nonepileptic seizure   . Urinary tract infection     Past Surgical History:  Procedure Laterality Date  . COLONOSCOPY WITH ESOPHAGOGASTRODUODENOSCOPY (EGD)    . HYMENECTOMY  2016    Family Psychiatric History: Please see initial evaluation for full details. I have reviewed the history. No updates at this time.      Family History:  Family History  Problem Relation Age of Onset  . Anxiety disorder Mother   . Depression Mother   . Hyperlipidemia Mother   . Bipolar disorder Mother   . Thyroid disease Maternal Grandmother   . Deep vein thrombosis Maternal Grandmother   . Thyroid disease Maternal Grandfather   . Anxiety disorder Father   . Depression Father   . Autism Sister   . Other Sister        [place] syndrome  . Factor V Leiden deficiency Maternal Aunt   . Breast cancer Maternal Aunt   . Bipolar disorder Maternal Aunt   . Diabetes Paternal Grandfather   . Deep vein thrombosis Maternal Aunt   . Bipolar disorder Maternal Aunt   . Colon cancer Neg Hx   . Esophageal cancer Neg Hx   . Rectal cancer Neg Hx     Social History:  Social History   Socioeconomic History  . Marital status: Single    Spouse name: Not on file  . Number of children: 0  . Years of education: Not on file  . Highest education level: Not on file  Occupational History  . Occupation: Consulting civil engineer  Tobacco Use  . Smoking status: Never Smoker  . Smokeless tobacco: Never Used  Substance and Sexual Activity  . Alcohol use: Never  . Drug use: Never  . Sexual activity: Yes    Birth control/protection: None, Condom  Other Topics Concern  . Not on file  Social History Narrative   From [place].  Was receiving medical care at [facility].  Resides with mother.   Social Determinants of Health   Financial Resource Strain:   . Difficulty of Paying Living Expenses:   Food Insecurity:   . Worried About Programme researcher, broadcasting/film/video in the Last Year:   . Barista in the Last Year:   Transportation Needs:   . Freight forwarder (Medical):   Marland Kitchen Lack of Transportation (Non-Medical):   Physical Activity:   . Days of Exercise per Week:   . Minutes of Exercise per Session:   Stress:   . Feeling of Stress :   Social Connections:   . Frequency of Communication with Friends and Family:   . Frequency of Social Gatherings with  Friends and Family:   . Attends Religious Services:   . Active Member of Clubs or Organizations:   . Attends Banker Meetings:   Marland Kitchen Marital Status:     Allergies:  Allergies  Allergen Reactions  . Sulfa Antibiotics Anaphylaxis  . Amitriptyline     Hallucinations    Metabolic Disorder Labs: Lab Results  Component Value Date   HGBA1C 5.2 08/30/2018   No results found for: PROLACTIN Lab Results  Component Value Date   CHOL 171 04/14/2018  TRIG 140 04/14/2018   HDL 50 04/14/2018   CHOLHDL 3.4 04/14/2018   LDLCALC 93 04/14/2018   Lab Results  Component Value Date   TSH 0.737 08/30/2018    Therapeutic Level Labs: No results found for: LITHIUM No results found for: VALPROATE No components found for:  CBMZ  Current Medications: Current Outpatient Medications  Medication Sig Dispense Refill  . ALPRAZolam (XANAX) 0.5 MG tablet Take 1 tablet (0.5 mg total) by mouth 3 (three) times daily as needed for anxiety. 90 tablet 1  . Brexpiprazole (REXULTI) 0.5 MG TABS Take 1 tablet (0.5 mg total) by mouth every evening. 90 tablet 1  . ciprofloxacin (CIPRO) 500 MG tablet Take 1 tablet (500 mg total) by mouth 2 (two) times daily. 14 tablet 0  . hydroxypropyl methylcellulose / hypromellose (ISOPTO TEARS / GONIOVISC) 2.5 % ophthalmic solution Place 1 drop into both eyes 3 (three) times daily as needed for dry eyes.    Marland Kitchen lamoTRIgine (LAMICTAL) 100 MG tablet Take 1 tablet (100 mg total) by mouth 2 (two) times daily. Please call [phone] to schedule yearly appt or may request from PCP. 60 tablet 1  . lamoTRIgine (LAMICTAL) 100 MG tablet Take 1 tablet (100 mg total) by mouth 2 (two) times daily. 60 tablet 1  . sertraline (ZOLOFT) 50 MG tablet 25 mg daily for one week, then 50 mg daily 30 tablet 1   No current facility-administered medications for this visit.     Musculoskeletal: Strength & Muscle Tone: N/A Gait & Station: N/A Patient leans: N/A  Psychiatric Specialty  Exam: Review of Systems  Psychiatric/Behavioral: Positive for dysphoric mood and sleep disturbance. Negative for agitation, behavioral problems, confusion, decreased concentration, hallucinations, self-injury and suicidal ideas. The patient is nervous/anxious. The patient is not hyperactive.   All other systems reviewed and are negative.   There were no vitals taken for this visit.There is no height or weight on file to calculate BMI.  General Appearance: Fairly Groomed  Eye Contact:  Good  Speech:  Clear and Coherent  Volume:  Normal  Mood:  Anxious and Depressed  Affect:  Appropriate, Congruent and down, slightly restricted  Thought Process:  Coherent  Orientation:  Full (Time, Place, and Person)  Thought Content: Logical   Suicidal Thoughts:  No  Homicidal Thoughts:  No  Memory:  Immediate;   Good  Judgement:  Good  Insight:  Fair  Psychomotor Activity:  Normal  Concentration:  Concentration: Good and Attention Span: Good  Recall:  Good  Fund of Knowledge: Good  Language: Good  Akathisia:  No  Handed:  Right  AIMS (if indicated): not done  Assets:  Communication Skills Desire for Improvement  ADL's:  Intact  Cognition: WNL  Sleep:  Poor   Screenings: PHQ2-9     Office Visit from 10/19/2018 in [facility] Office Visit from 08/30/2018 in [facility] Office Visit from 08/22/2018 in [facility] Office Visit from 07/17/2018 in [facility] Office Visit from 04/14/2018 in [facility]  PHQ-2 Total Score  3  2  3  6  1   PHQ-9 Total Score  15  12  13  20   --       Assessment and Plan:  is a 24 y.o. 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 GAD (generalized anxiety disorder) 2. Mild episode of recurrent major depressive disorder (HCC) She continues to report anxiety and depressive symptoms since the last visit.  Psychosocial stressors includes living with other family, and demoralization due to her physical condition of seizure-like episodes, myalgia and headache.  She reports constipation since starting venlafaxine.  We will cross taper from venlafaxine to sertraline to target anxiety and depression.  Will continue Rexulti as adjunctive treatment for depression. Discussed potential metabolic side effect.  Will continue lamotrigine at this time given patient strong preference for mood stabilization.  Discussed with the patient that this medication to be tapered off in the future to avoid polypharmacy.  Noted that this medication is not indicated for seizure despite it has been prescribed by neurology according to the chart review. Discussed risk of steven;s johnson syndrome. Will continue Xanax as needed for anxiety.  Discussed risk of dependence and oversedation.   Plan 1.  Week 1: decrease venlafaxine 37.5 mg daily, start sertraline 25 mg daily  Week 2: discontinue venlafaxine, increase sertraline 50 mg daily  2. Continue Rexulti 0.5 mg daily 3. Continue lamotrigine 100 mg twice a day  4. Continue Xanax 0.5 mg twice to three times a day as needed for anxiety 5. Next appointment: 7/6 at 4 PM for 30 mins, video  Past trials of medication:sertraline,fluoxetine, Lexapro,duloxetine (headache, nightmares),elavil (hallucinations), Rexulti, Abilify, Xanax, lamotrigine, temazepam  The patient demonstrates the following risk factors for suicide: Chronic risk factors for suicide include:psychiatric disorder ofdepressionand chronic pain. Acute risk factorsfor suicide include: unemployment and social withdrawal/isolation. Protective factorsfor this patient include: positive social support, coping skills and hope for the  future. Considering these factors, the overall suicide risk at this point appears to below. Patientisappropriate for outpatient follow up.  Neysa Hotter, MD 02/11/2020, 4:26 PM

## 2020-02-06 IMAGING — CT CT ABD-PELV W/ CM
2 of 4 series · 13 of 36 positions shown, 16 images · IV contrast (iopamidol)
Comparison: None.

CLINICAL DATA: Shortness of breath, generalized abdominal pain,
chronic

EXAM:
CT CHEST, ABDOMEN, AND PELVIS WITH CONTRAST
TECHNIQUE: Multidetector CT imaging of the chest, abdomen and pelvis was
performed following the standard protocol during bolus
administration of intravenous contrast.
CONTRAST:  100mL SQOYZI-QRR IOPAMIDOL (SQOYZI-QRR) INJECTION 61%

[Series 2: cap with · axial · 0.69mm/px · z∈[+906,+1431]mm · 10 of 127 slices shown, 13 images]
[im 11/127  mediastinal]
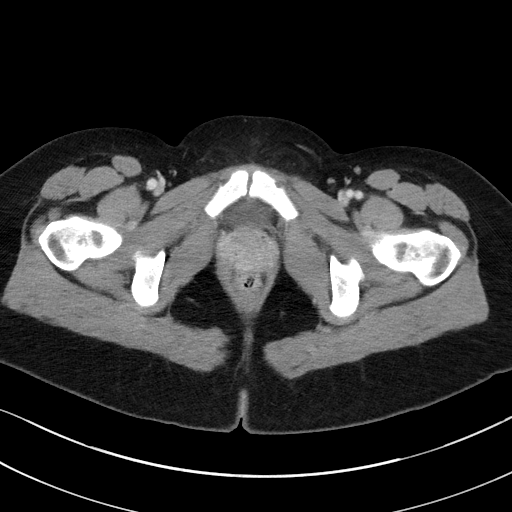
[im 11/127  lung]
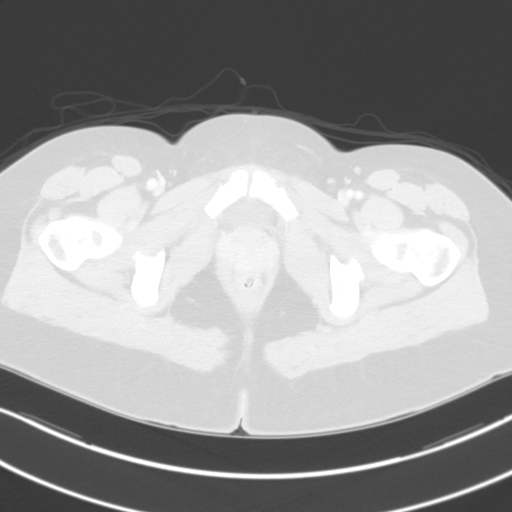
[im 22/127  lung]
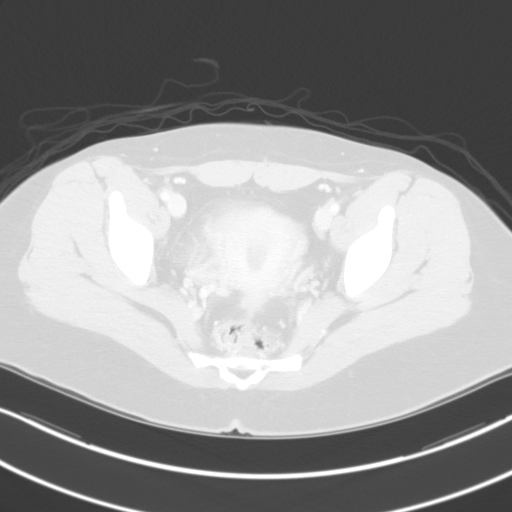
[im 32/127  lung]
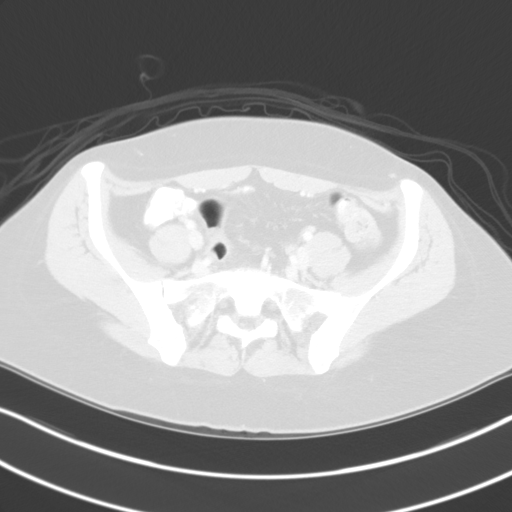
[im 43/127  lung]
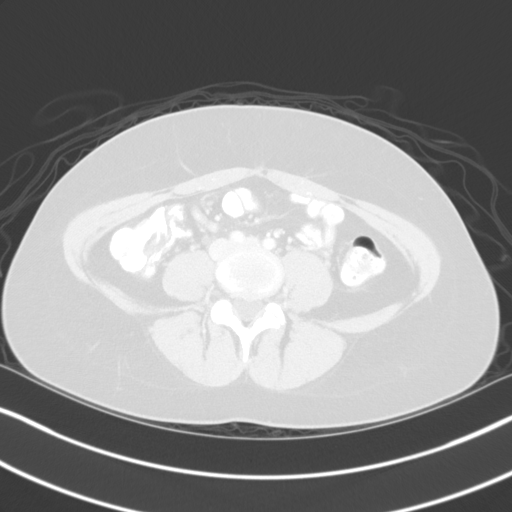
[im 53/127  mediastinal]
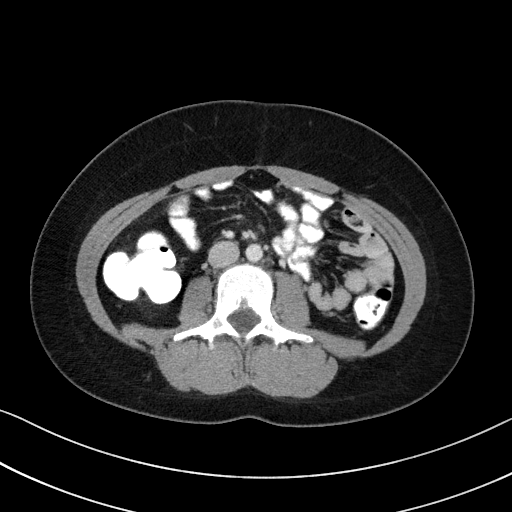
[im 53/127  lung]
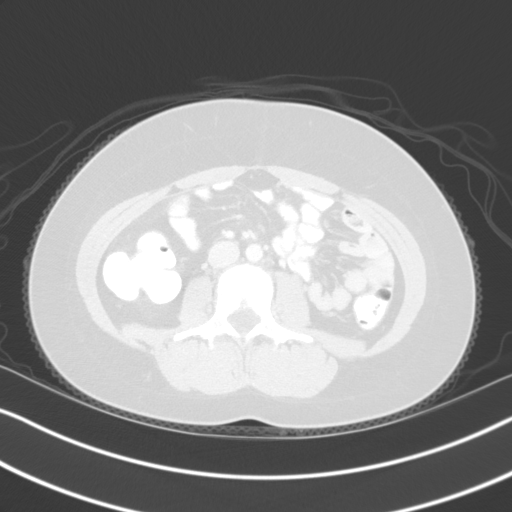
[im 74/127  lung]
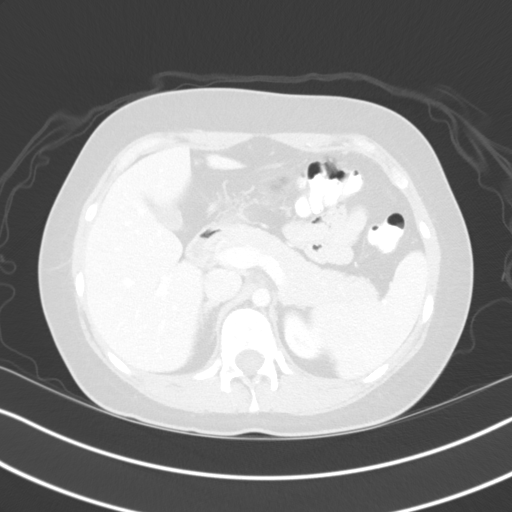
[im 85/127  lung]
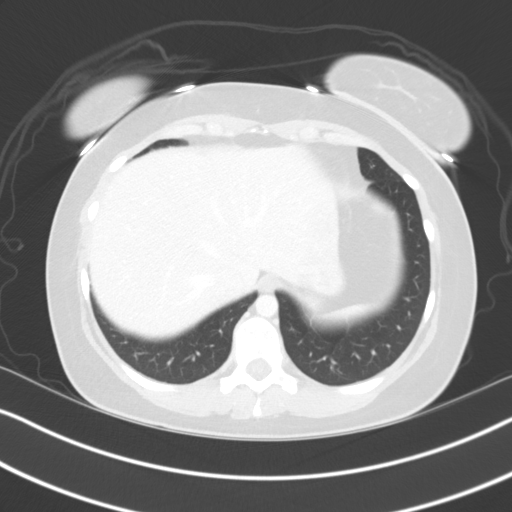
[im 95/127  lung]
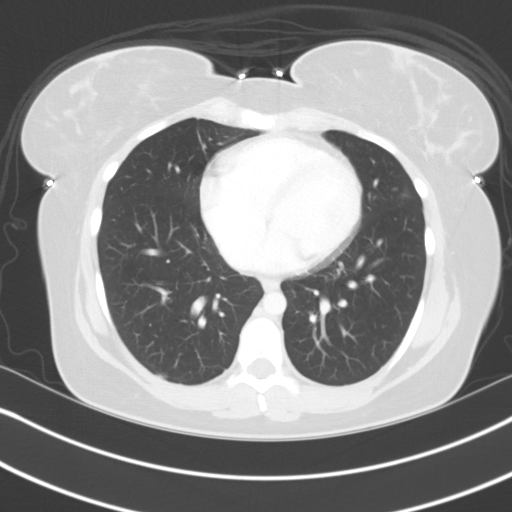
[im 106/127  mediastinal]
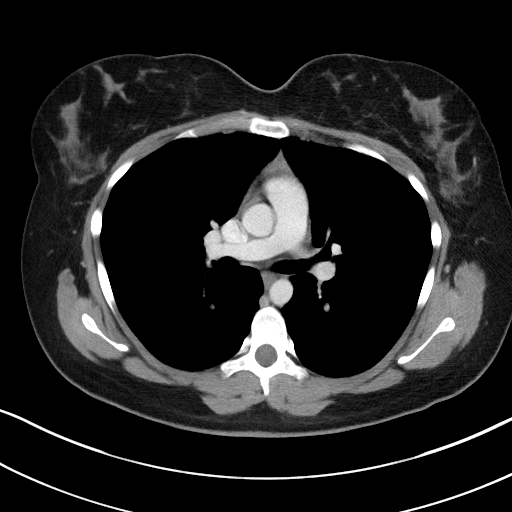
[im 106/127  lung]
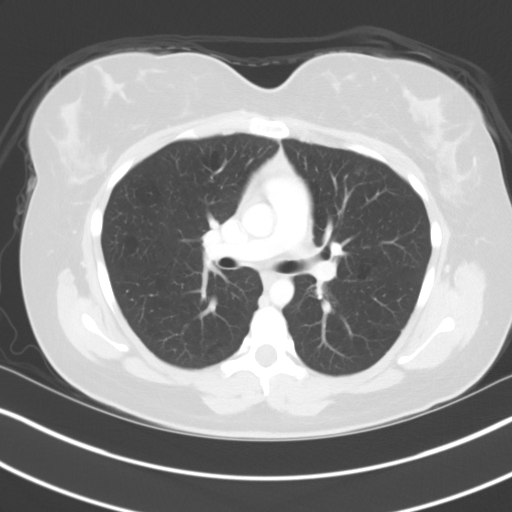
[im 116/127  lung]
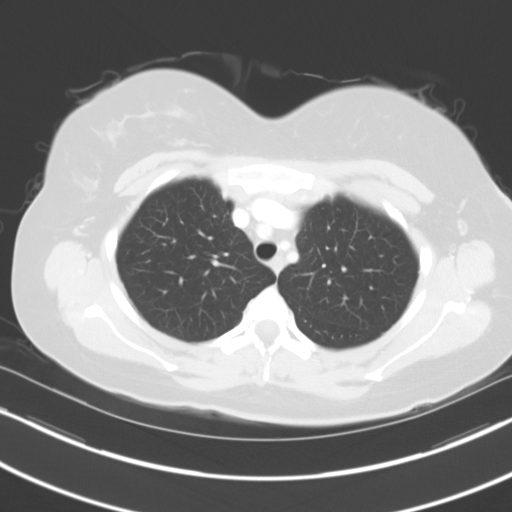

[Series 4: coronals · coronal · 0.67mm/px · 3 of 135 slices shown]
[im 27/135  lung]
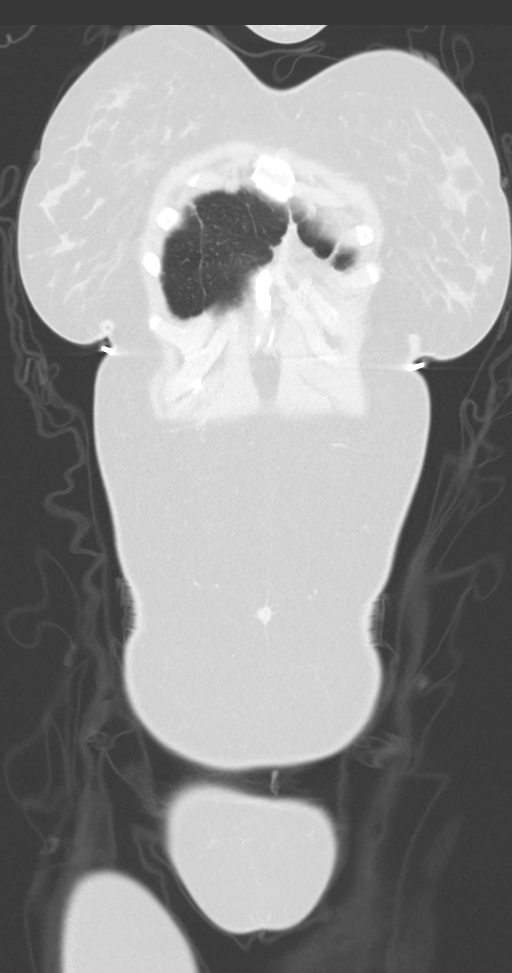
[im 54/135  lung]
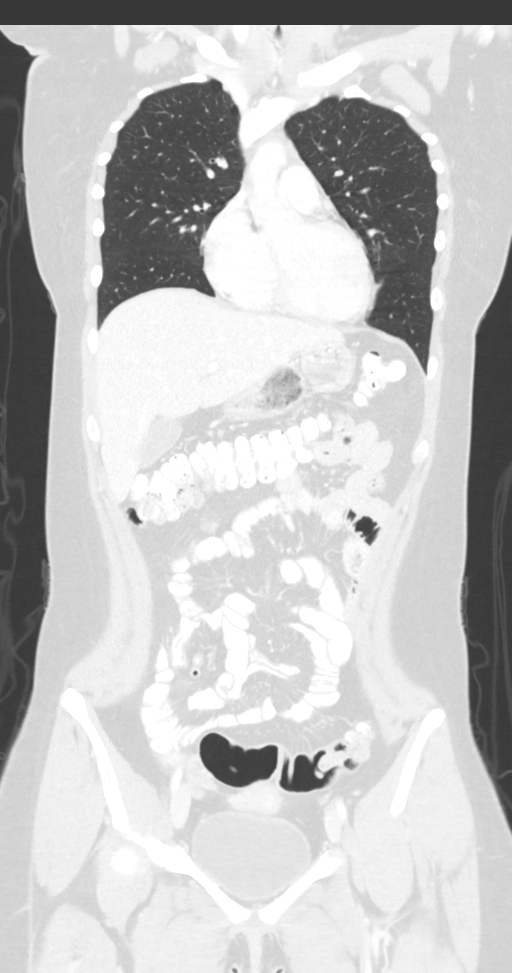
[im 81/135  lung]
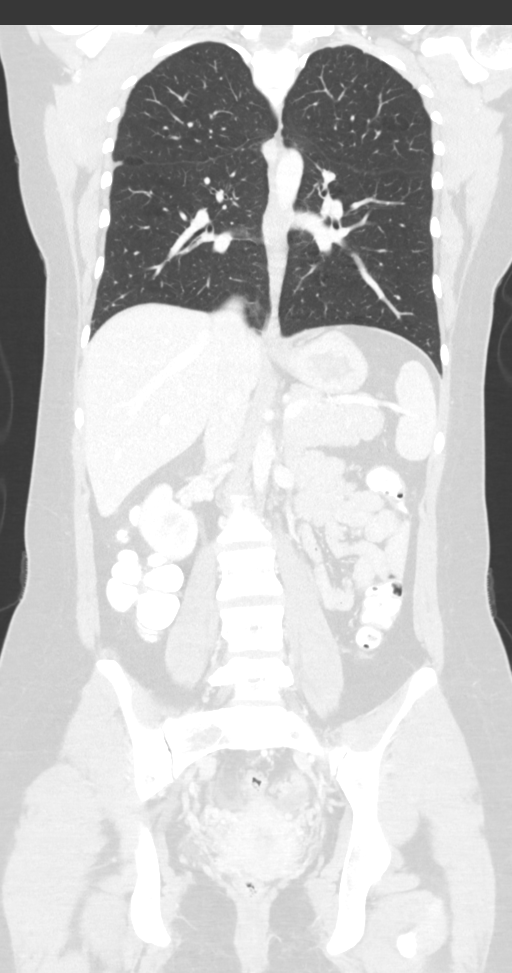

[13 of 36 positions shown; findings below may reference images not displayed]

FINDINGS: CT CHEST FINDINGS

Cardiovascular: Heart is normal size. Aorta is normal caliber.

Mediastinum/Nodes: No mediastinal, hilar, or axillary adenopathy.
Thyroid unremarkable. Soft tissue in the anterior mediastinum felt
represent residual thymus.

Lungs/Pleura: Lungs are clear. No focal airspace opacities or
suspicious nodules. No effusions.

Musculoskeletal: Chest wall soft tissues are unremarkable. No acute
bony abnormality.

CT ABDOMEN PELVIS FINDINGS

Hepatobiliary: Small density noted along the posterior wall of the
gallbladder could reflect small stone or polyp. No visible wall
thickening or pericholecystic fluid. No focal hepatic abnormality.

Pancreas: No focal abnormality or ductal dilatation.

Spleen: No focal abnormality.  Normal size.

Adrenals/Urinary Tract: No adrenal abnormality. No focal renal
abnormality. No stones or hydronephrosis. Urinary bladder is
unremarkable.

Stomach/Bowel: Normal appendix Stomach, large and small bowel
grossly unremarkable.

Vascular/Lymphatic: No evidence of aneurysm or adenopathy.

Reproductive: Small collapsing follicle or functional cyst in the
right ovary measuring 2 cm. No adnexal mass. Uterus unremarkable.

Other: No free fluid or free air.

Musculoskeletal: No acute bony abnormality.
IMPRESSION: No acute findings in the chest, abdomen or pelvis.

Very small density posteriorly in the gallbladder could reflect
small stone or polyp. No CT evidence for cholecystitis.

## 2020-02-11 ENCOUNTER — Encounter (HOSPITAL_COMMUNITY): Payer: Self-pay | Admitting: Psychiatry

## 2020-02-11 ENCOUNTER — Other Ambulatory Visit: Payer: Self-pay

## 2020-02-11 ENCOUNTER — Telehealth (INDEPENDENT_AMBULATORY_CARE_PROVIDER_SITE_OTHER): Payer: Medicare Other | Admitting: Psychiatry

## 2020-02-11 DIAGNOSIS — F330 Major depressive disorder, recurrent, mild: Secondary | ICD-10-CM

## 2020-02-11 DIAGNOSIS — F411 Generalized anxiety disorder: Secondary | ICD-10-CM

## 2020-02-11 MED ORDER — LAMOTRIGINE 100 MG PO TABS: 100.0000 mg | ORAL_TABLET | Freq: Two times a day (BID) | ORAL | 1 refills | Status: DC

## 2020-02-11 MED ORDER — SERTRALINE HCL 50 MG PO TABS: ORAL_TABLET | ORAL | 1 refills | Status: DC

## 2020-02-11 MED ORDER — ALPRAZOLAM 0.5 MG PO TABS: 0.5000 mg | ORAL_TABLET | Freq: Three times a day (TID) | ORAL | 1 refills | Status: DC | PRN

## 2020-02-11 NOTE — Patient Instructions (Signed)
1. Change medication as below Week 1: decrease venlafaxine 37.5 mg daily, start sertraline 25 mg daily  Week 2: discontinue venlafaxine, increase sertraline 50 mg daily  Week 3:- continue sertraline 50 mg daily  2. Continue Rexulti 0.5 mg daily 3. Continue lamotrigine 100 mg twice a day  4. Continue Xanax 0.5 mg twice to three times a day as needed for anxiety 5. Next appointment: 7/6 at 4 PM

## 2020-02-12 ENCOUNTER — Encounter: Payer: Self-pay | Admitting: Family Medicine

## 2020-02-14 ENCOUNTER — Other Ambulatory Visit: Payer: Self-pay

## 2020-02-14 ENCOUNTER — Ambulatory Visit (INDEPENDENT_AMBULATORY_CARE_PROVIDER_SITE_OTHER): Payer: Medicare Other | Admitting: Family Medicine

## 2020-02-14 ENCOUNTER — Encounter: Payer: Self-pay | Admitting: Family Medicine

## 2020-02-14 VITALS — BP 92/63 | HR 88 | Temp 98.3°F | Ht 63.0 in | Wt 127.0 lb

## 2020-02-14 DIAGNOSIS — W57XXXA Bitten or stung by nonvenomous insect and other nonvenomous arthropods, initial encounter: Secondary | ICD-10-CM

## 2020-02-14 DIAGNOSIS — S70362A Insect bite (nonvenomous), left thigh, initial encounter: Secondary | ICD-10-CM

## 2020-02-14 NOTE — Progress Notes (Signed)
BP 92/63   Pulse 88   Temp 98.3 F (36.8 C)   Ht '5\' 3"'$  (1.6 m)   Wt 127 lb (57.6 kg)   LMP 02/03/2020 (Approximate)   SpO2 96%   BMI 22.50 kg/m    Subjective:   Patient ID: Samantha Dougherty, female    DOB: 1996-05-30, 24 y.o.   MRN: [id number]  HPI: Samantha Dougherty is a 24 y.o. female presenting on 02/14/2020 for Tick Bites (joint pain)   HPI Patient is coming in for tick bites, she had 2 tick bites on her left anterior thigh near her groin.  She says she remove them 2 weeks ago and now she is having some fatigue and some arthralgias, she has low energy and decreased appetite, she is also been having some GERD symptoms like she gets sometimes.  She says she removed 2 ticks around the same time on the anterior thigh and one had a dot on his back and one did not.  She is also getting some constipation but she gets that regularly.  There is no rash left with a defect.  Relevant past medical, surgical, family and social history reviewed and updated as indicated. Interim medical history since our last visit reviewed. Allergies and medications reviewed and updated.  Review of Systems  Constitutional: Negative for chills and fever.  Eyes: Negative for visual disturbance.  Respiratory: Negative for chest tightness and shortness of breath.   Cardiovascular: Negative for chest pain and leg swelling.  Musculoskeletal: Positive for arthralgias. Negative for back pain and gait problem.  Skin: Negative for color change, rash and wound.  Neurological: Negative for light-headedness and headaches.  Psychiatric/Behavioral: Negative for agitation and behavioral problems.  All other systems reviewed and are negative.   Per HPI unless specifically indicated above   Allergies as of 02/14/2020      Reactions   Sulfa Antibiotics Anaphylaxis   Amitriptyline    Hallucinations      Medication List       Accurate as of Feb 14, 2020  4:01 PM. If you have any questions, ask your nurse or doctor.          STOP taking these medications   ciprofloxacin 500 MG tablet Commonly known as: Cipro Stopped by: Fransisca Kaufmann Deborha Moseley, MD   hydroxypropyl methylcellulose / hypromellose 2.5 % ophthalmic solution Commonly known as: ISOPTO TEARS / GONIOVISC Stopped by: Fransisca Kaufmann Audie Wieser, MD     TAKE these medications   ALPRAZolam 0.5 MG tablet Commonly known as: XANAX Take 1 tablet (0.5 mg total) by mouth 3 (three) times daily as needed for anxiety.   lamoTRIgine 100 MG tablet Commonly known as: LAMICTAL Take 1 tablet (100 mg total) by mouth 2 (two) times daily. Please call [phone] to schedule yearly appt or may request from PCP. What changed: Another medication with the same name was removed. Continue taking this medication, and follow the directions you see here. Changed by: Fransisca Kaufmann Alacia Rehmann, MD   Rexulti 0.5 MG Tabs Generic drug: Brexpiprazole Take 1 tablet (0.5 mg total) by mouth every evening.   sertraline 50 MG tablet Commonly known as: ZOLOFT 25 mg daily for one week, then 50 mg daily        Objective:   BP 92/63   Pulse 88   Temp 98.3 F (36.8 C)   Ht '5\' 3"'$  (1.6 m)   Wt 127 lb (57.6 kg)   LMP 02/03/2020 (Approximate)   SpO2 96%   BMI 22.50  kg/m   Wt Readings from Last 3 Encounters:  02/14/20 127 lb (57.6 kg)  12/13/18 146 lb (66.2 kg)  10/19/18 149 lb (67.6 kg)    Physical Exam Vitals and nursing note reviewed.  Constitutional:      General: She is not in acute distress.    Appearance: She is well-developed. She is not diaphoretic.  Eyes:     Conjunctiva/sclera: Conjunctivae normal.  Musculoskeletal:        General: No tenderness. Normal range of motion.  Skin:    General: Skin is warm and dry.     Findings: No lesion or rash.  Neurological:     Mental Status: She is alert and oriented to person, place, and time.     Coordination: Coordination normal.  Psychiatric:        Behavior: Behavior normal.       Assessment & Plan:   Problem List Items  Addressed This Visit    None    Visit Diagnoses    Tick bite, initial encounter    -  Primary   Relevant Orders   CBC with Differential/Platelet   CMP14+EGFR   Rocky mtn spotted fvr abs pnl(IgG+IgM)   Lyme Ab/Western Blot Reflex   Alpha-Gal Panel    Will do testing and then if any testing comes back positive we will discuss treatment.  Follow up plan: Return if symptoms worsen or fail to improve.  Counseling provided for all of the vaccine components Orders Placed This Encounter  Procedures  . CBC with Differential/Platelet  . CMP14+EGFR  . Rocky mtn spotted fvr abs pnl(IgG+IgM)  . Lyme Ab/Western Blot Reflex  . [place], MD [facility] 02/14/2020, 4:01 PM

## 2020-02-19 ENCOUNTER — Other Ambulatory Visit: Payer: Self-pay | Admitting: Family Medicine

## 2020-02-19 ENCOUNTER — Telehealth: Payer: Self-pay | Admitting: Family Medicine

## 2020-02-19 DIAGNOSIS — A770 Spotted fever due to Rickettsia rickettsii: Secondary | ICD-10-CM

## 2020-02-19 MED ORDER — DOXYCYCLINE HYCLATE 100 MG PO TABS: 100.0000 mg | ORAL_TABLET | Freq: Two times a day (BID) | ORAL | 0 refills | Status: AC

## 2020-02-19 MED ORDER — DOXYCYCLINE HYCLATE 100 MG PO TABS: 100.0000 mg | ORAL_TABLET | Freq: Two times a day (BID) | ORAL | 0 refills | Status: DC

## 2020-02-19 NOTE — Telephone Encounter (Signed)
It appears that Dr. Louanne Skye has not yet reviewed these.  I will make sure to copy him on this note.  Her titers do suggest possible recent infection.  Typically duration of treatment depends on how quickly the fevers resolved.  I do not read in the note that she was febrile so unsure if she really needs the doxycycline or if she was just recently infected and not acutely infected.  However, I am going to place her on a 2-week course of doxycycline.  This should be more than adequate given the absence of fever

## 2020-02-19 NOTE — Telephone Encounter (Signed)
They are aware of provider feedback and voiced understanding. They need the doxy to be sent to [facility] in [place] as they are going to be there for over a week. Rx sent to [facility] per pt request.

## 2020-02-19 NOTE — Telephone Encounter (Signed)
Per labs seen on mychart please advise.

## 2020-02-20 ENCOUNTER — Telehealth: Payer: Self-pay | Admitting: *Deleted

## 2020-02-20 LAB — ALPHA-GAL PANEL
Alpha Gal IgE*: 0.34 kU/L — ABNORMAL HIGH (ref <0.10)
Beef (Bos spp) IgE: 0.2 kU/L (ref <0.35)
Class Interpretation: 0
Lamb/Mutton (Ovis spp) IgE: <0.1 kU/L (ref <0.35)
Pork (Sus spp) IgE: 0.11 kU/L (ref <0.35)

## 2020-02-20 LAB — CMP14+EGFR
ALT: 11 IU/L (ref 0–32)
AST: 17 IU/L (ref 0–40)
Albumin/Globulin Ratio: 2.1 (ref 1.2–2.2)
Albumin: 4.9 g/dL (ref 3.9–5.0)
Alkaline Phosphatase: 73 IU/L (ref 48–121)
BUN/Creatinine Ratio: 9 (ref 9–23)
BUN: 7 mg/dL (ref 6–20)
Bilirubin Total: 0.4 mg/dL (ref 0.0–1.2)
CO2: 28 mmol/L (ref 20–29)
Calcium: 10.3 mg/dL — ABNORMAL HIGH (ref 8.7–10.2)
Chloride: 101 mmol/L (ref 96–106)
Creatinine, Ser: 0.81 mg/dL (ref 0.57–1.00)
GFR calc Af Amer: 119 mL/min/{1.73_m2} (ref >59)
GFR calc non Af Amer: 103 mL/min/{1.73_m2} (ref >59)
Globulin, Total: 2.3 g/dL (ref 1.5–4.5)
Glucose: 92 mg/dL (ref 65–99)
Potassium: 4.5 mmol/L (ref 3.5–5.2)
Sodium: 141 mmol/L (ref 134–144)
Total Protein: 7.2 g/dL (ref 6.0–8.5)

## 2020-02-20 LAB — ROCKY MTN SPOTTED FVR ABS PNL(IGG+IGM)
RMSF IgG: POSITIVE — AB
RMSF IgM: 0.53 index (ref 0.00–0.89)

## 2020-02-20 LAB — CBC WITH DIFFERENTIAL/PLATELET
Basophils Absolute: 0 10*3/uL (ref 0.0–0.2)
Basos: 1 %
EOS (ABSOLUTE): 0 10*3/uL (ref 0.0–0.4)
Eos: 1 %
Hematocrit: 43.3 % (ref 34.0–46.6)
Hemoglobin: 14.3 g/dL (ref 11.1–15.9)
Immature Grans (Abs): 0 10*3/uL (ref 0.0–0.1)
Immature Granulocytes: 0 %
Lymphocytes Absolute: 2.3 10*3/uL (ref 0.7–3.1)
Lymphs: 38 %
MCH: 30.5 pg (ref 26.6–33.0)
MCHC: 33 g/dL (ref 31.5–35.7)
MCV: 92 fL (ref 79–97)
Monocytes Absolute: 0.4 10*3/uL (ref 0.1–0.9)
Monocytes: 6 %
Neutrophils Absolute: 3.2 10*3/uL (ref 1.4–7.0)
Neutrophils: 54 %
Platelets: 264 10*3/uL (ref 150–450)
RBC: 4.69 x10E6/uL (ref 3.77–5.28)
RDW: 12 % (ref 11.7–15.4)
WBC: 6 10*3/uL (ref 3.4–10.8)

## 2020-02-20 LAB — LYME AB/WESTERN BLOT REFLEX
LYME DISEASE AB, QUANT, IGM: <0.8 index (ref 0.00–0.79)
Lyme IgG/IgM Ab: <0.91 {ISR} (ref 0.00–0.90)

## 2020-02-20 LAB — RMSF, IGG, IFA: RMSF, IGG, IFA: 1:128 {titer} — ABNORMAL HIGH

## 2020-02-20 NOTE — Telephone Encounter (Signed)
Reviewed positive result on [facility] Spotted Fever and symptoms.  Advised on over the counter comfort medications.

## 2020-03-10 ENCOUNTER — Telehealth (HOSPITAL_COMMUNITY): Payer: Self-pay | Admitting: Psychiatry

## 2020-03-10 NOTE — Telephone Encounter (Signed)
LMOM

## 2020-03-10 NOTE — Telephone Encounter (Signed)
Received refill request for lamotrigine from pharmacy. According to the chart, lamotrigine order from this provider was discontinued at the recent visit with her PCP. Could you ask the patient if the medication to be sent by her PCP moving on? If not, please verify with her that she takes lamotrigine 100 mg BID.

## 2020-03-13 ENCOUNTER — Encounter (HOSPITAL_COMMUNITY): Payer: Self-pay | Admitting: *Deleted

## 2020-03-14 ENCOUNTER — Other Ambulatory Visit (HOSPITAL_COMMUNITY): Payer: Self-pay | Admitting: Psychiatry

## 2020-03-14 MED ORDER — LAMOTRIGINE 100 MG PO TABS: 100.0000 mg | ORAL_TABLET | Freq: Two times a day (BID) | ORAL | 0 refills | Status: DC

## 2020-03-14 NOTE — Telephone Encounter (Signed)
Ordered lamotrigine refill.

## 2020-03-15 ENCOUNTER — Encounter: Payer: Self-pay | Admitting: Family Medicine

## 2020-03-17 ENCOUNTER — Other Ambulatory Visit: Payer: Self-pay | Admitting: Family Medicine

## 2020-03-17 DIAGNOSIS — N809 Endometriosis, unspecified: Secondary | ICD-10-CM

## 2020-03-18 NOTE — Progress Notes (Signed)
Virtual Visit via Telephone Note  I connected with Samantha Dougherty on 03/25/20 at  4:00 PM EDT by telephone and verified that I am speaking with the correct person using two identifiers.   I discussed the limitations, risks, security and privacy concerns of performing an evaluation and management service by telephone and the availability of in person appointments. I also discussed with the patient that there may be a patient responsible charge related to this service. The patient expressed understanding and agreed to proceed.    I discussed the assessment and treatment plan with the patient. The patient was provided an opportunity to ask questions and all were answered. The patient agreed with the plan and demonstrated an understanding of the instructions.   The patient was advised to call back or seek an in-person evaluation if the symptoms worsen or if the condition fails to improve as anticipated.  Location: patient- home, provider- home office   I provided 12 minutes of non-face-to-face time during this encounter.   Neysa Hotter, MD    [facility] MD/PA/NP OP Progress Note  03/25/2020 4:29 PM Samantha Dougherty  Chief Complaint:  Chief Complaint    Depression; Anxiety     HPI:  - Per chart review, she had a seizure like episode. Per 6/15 eeg,  Clinical Correlation : While a normal EEG does not rule out epilepsy, there was no evidence of an underlying seizure disorder on this study. Abnormal movements were nonepileptic in origin. This is a follow-up appointment for depression and anxiety.  She states that she is not doing well due to pain, which she attributes to endometriosis.  She has an upcoming appointment with OB/GYN tomorrow.  She has been depressed and anxious about her medical condition.  She states that she wants to have a normal life.  She does not have any friends, and has not been able to go outside due to pain.  She reports very close relationship with her parents and  her sister.  They have been able to move out from her relatives 2 days ago.  She has insomnia.  She feels fatigue.  She has fair concentration.  She has mild anhedonia.  She denies SI.  She feels anxious and tense at times.  She denies panic attacks.  She could not continue sertraline due to feeling confused and had some dizziness. She restarted venlafaxine 37.5 mg daily. She takes stool softer for constipation.    Visit Diagnosis:    ICD-10-CM   1. GAD (generalized anxiety disorder)  F41.1   2. Mild episode of recurrent major depressive disorder (HCC)  F33.0     Past Psychiatric History: Please see initial evaluation for full details. I have reviewed the history. No updates at this time.     Past Medical History:  Past Medical History:  Diagnosis Date   Anti-phospholipid antibody syndrome (HCC)    Anxiety    Asthma    Bipolar 2 disorder (HCC)    Conversion disorder    Depression    DVT (deep venous thrombosis) (HCC)    LLE   Fibromyalgia    Gallbladder polyp    H. pylori infection    IBS (irritable bowel syndrome)    Legal blindness    Right eye   Migraine    Mood disorder (HCC)    Psychogenic nonepileptic seizure    Urinary tract infection     Past Surgical History:  Procedure Laterality Date   COLONOSCOPY WITH ESOPHAGOGASTRODUODENOSCOPY (EGD)  HYMENECTOMY  2016    Family Psychiatric History: Please see initial evaluation for full details. I have reviewed the history. No updates at this time.     Family History:  Family History  Problem Relation Age of Onset   Anxiety disorder Mother    Depression Mother    Hyperlipidemia Mother    Bipolar disorder Mother    Thyroid disease Maternal Grandmother    Deep vein thrombosis Maternal Grandmother    Thyroid disease Maternal Grandfather    Anxiety disorder Father    Depression Father    Autism Sister    Other Sister        [place] syndrome   Factor V Leiden deficiency Maternal Aunt     Breast cancer Maternal Aunt    Bipolar disorder Maternal Aunt    Diabetes Paternal Grandfather    Deep vein thrombosis Maternal Aunt    Bipolar disorder Maternal Aunt    Colon cancer Neg Hx    Esophageal cancer Neg Hx    Rectal cancer Neg Hx     Social History:  Social History   Socioeconomic History   Marital status: Single    Spouse name: Not on file   Number of children: 0   Years of education: Not on file   Highest education level: Not on file  Occupational History   Occupation: student  Tobacco Use   Smoking status: Never Smoker   Smokeless tobacco: Never Used  Building services engineerVaping Use   Vaping Use: Never used  Substance and Sexual Activity   Alcohol use: Never   Drug use: Never   Sexual activity: Yes    Birth control/protection: None, Condom  Other Topics Concern   Not on file  Social History Narrative   From [place].  Was [facility].  Resides with mother.   Social Determinants of Health   Financial Resource Strain:    Difficulty of Paying Living Expenses:   Food Insecurity:    Worried About Programme researcher, broadcasting/film/videounning Out of Food in the Last Year:    Baristaan Out of Food in the Last Year:   Transportation Needs:    Freight forwarderLack of Transportation (Medical):    Lack of Transportation (Non-Medical):   Physical Activity:    Days of Exercise per Week:    Minutes of Exercise per Session:   Stress:    Feeling of Stress :   Social Connections:    Frequency of Communication with Friends and Family:    Frequency of Social Gatherings with Friends and Family:    Attends Religious Services:    Active Member of Clubs or Organizations:    Attends BankerClub or Organization Meetings:    Marital Status:     Allergies:  Allergies  Allergen Reactions   Sulfa Antibiotics Anaphylaxis   Amitriptyline     Hallucinations    Metabolic Disorder Labs: Lab Results  Component Value Date   HGBA1C 5.2 08/30/2018   No results found for: PROLACTIN Lab Results   Component Value Date   CHOL 171 04/14/2018   TRIG 140 04/14/2018   HDL 50 04/14/2018   CHOLHDL 3.4 04/14/2018   LDLCALC 93 04/14/2018   Lab Results  Component Value Date   TSH 0.737 08/30/2018    Therapeutic Level Labs: No results found for: LITHIUM No results found for: VALPROATE No components found for:  CBMZ  Current Medications: Current Outpatient Medications  Medication Sig Dispense Refill   [START ON 04/10/2020] ALPRAZolam (XANAX) 0.5 MG tablet Take 1 tablet (  0.5 mg total) by mouth 2 (two) times daily as needed for anxiety. 60 tablet 1   Brexpiprazole (REXULTI) 0.5 MG TABS Take 1 tablet (0.5 mg total) by mouth every evening. 90 tablet 1   [START ON 04/10/2020] lamoTRIgine (LAMICTAL) 100 MG tablet Take 1 tablet (100 mg total) by mouth 2 (two) times daily. 180 tablet 0   venlafaxine XR (EFFEXOR-XR) 37.5 MG 24 hr capsule Take 1 capsule (37.5 mg total) by mouth daily with breakfast. 90 capsule 0   No current facility-administered medications for this visit.     Musculoskeletal: Strength & Muscle Tone: N/A Gait & Station: N/A Patient leans: N/A  Psychiatric Specialty Exam: Review of Systems  Psychiatric/Behavioral: Positive for dysphoric mood and sleep disturbance. Negative for agitation, behavioral problems, confusion, decreased concentration, hallucinations, self-injury and suicidal ideas. The patient is nervous/anxious. The patient is not hyperactive.   All other systems reviewed and are negative.   There were no vitals taken for this visit.There is no height or weight on file to calculate BMI.  General Appearance: NA  Eye Contact:  NA  Speech:  Clear and Coherent  Volume:  Normal  Mood:  Anxious and Depressed  Affect:  NA  Thought Process:  Coherent  Orientation:  Full (Time, Place, and Person)  Thought Content: Logical   Suicidal Thoughts:  No  Homicidal Thoughts:  No  Memory:  Immediate;   Good  Judgement:  Good  Insight:  Fair  Psychomotor Activity:   Normal  Concentration:  Concentration: Good and Attention Span: Good  Recall:  Good  Fund of Knowledge: Good  Language: Good  Akathisia:  No  Handed:  Right  AIMS (if indicated): not done  Assets:  Communication Skills Desire for Improvement  ADL's:  Intact  Cognition: WNL  Sleep:  Fair   Screenings: GAD-7     Office Visit from 02/14/2020 in [facility]  Total GAD-7 Score 17    PHQ2-9     Office Visit from 02/14/2020 in [facility] Office Visit from 10/19/2018 in [facility] Office Visit from 08/30/2018 in [facility] Office Visit from 08/22/2018 in [facility] Office Visit from 07/17/2018 in [facility]  PHQ-2 Total Score 6 3 2 3 6   PHQ-9 Total Score 18 15 12 13 20        Assessment and Plan:  is a 24 y.o. 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    1. GAD (generalized anxiety disorder) 2. Mild episode of recurrent major depressive disorder (HCC) She reports depressive symptoms and anxiety in the context of having abdominal pain, which she attributes to endometriosis.  Other psychosocial stressors includes demoralization secondary to seizure-like episodes, myalgia and headache.  She could not tolerate sertraline due to adverse reaction, and has self reinitiated venlafaxine.  We will continue the current dose of venlafaxine given her preference.  Will not uptitrate at this time due to side effect of constipation.  We will continue Rexulti as adjunctive treatment for depression.  Discussed potential metabolic side effect.  We will continue lamotrigine for mood stabilization, which used to be prescribed by neurology. Noted that she has strong preference to be on this medication, and there is no indication  for seizure.  Discussed risk of Stevens-Johnson syndrome.  Will continue Xanax as needed for anxiety.  She is  aware of its risk of dependence and oversedation.   Plan 1. Continue venlafaxine 37.5 mg daily  2.Continue Rexulti 0.5 mg daily 3. Continue lamotrigine 100 mg twice a day  4. Continue Xanax 0.5 mg twice to three times a day as needed for anxiety  5. Next appointment: 9/7 at 2:30 PM for 30 mins, video  Past trials of medication:sertraline,fluoxetine, Lexapro,duloxetine (headache, nightmares),elavil (hallucinations), Rexulti, Abilify, Xanax, lamotrigine, temazepam  The patient demonstrates the following risk factors for suicide: Chronic risk factors for suicide include:psychiatric disorder ofdepressionand chronic pain. Acute risk factorsfor suicide include: unemployment and social withdrawal/isolation. Protective factorsfor this patient include: positive social support, coping skills and hope for the future. Considering these factors, the overall suicide risk at this point appears to below. Patientisappropriate for outpatient follow up.  Neysa Hotter, MD 03/25/2020, 4:29 PM

## 2020-03-25 ENCOUNTER — Other Ambulatory Visit: Payer: Self-pay

## 2020-03-25 ENCOUNTER — Telehealth (INDEPENDENT_AMBULATORY_CARE_PROVIDER_SITE_OTHER): Payer: Medicare Other | Admitting: Psychiatry

## 2020-03-25 ENCOUNTER — Encounter (HOSPITAL_COMMUNITY): Payer: Self-pay | Admitting: Psychiatry

## 2020-03-25 DIAGNOSIS — F330 Major depressive disorder, recurrent, mild: Secondary | ICD-10-CM | POA: Diagnosis not present

## 2020-03-25 DIAGNOSIS — F411 Generalized anxiety disorder: Secondary | ICD-10-CM

## 2020-03-25 MED ORDER — ALPRAZOLAM 0.5 MG PO TABS: 0.5000 mg | ORAL_TABLET | Freq: Two times a day (BID) | ORAL | 1 refills | Status: DC | PRN

## 2020-03-25 MED ORDER — LAMOTRIGINE 100 MG PO TABS: 100.0000 mg | ORAL_TABLET | Freq: Two times a day (BID) | ORAL | 0 refills | Status: DC

## 2020-03-25 MED ORDER — VENLAFAXINE HCL ER 37.5 MG PO CP24: 37.5000 mg | ORAL_CAPSULE | Freq: Every day | ORAL | 0 refills | Status: DC

## 2020-03-25 NOTE — Patient Instructions (Signed)
1. Continue venlafaxine 37.5 mg daily  2.Continue Rexulti 0.5 mg daily 3. Continue lamotrigine 100 mg twice a day  4. Continue Xanax 0.5 mg twice to three times a day as needed for anxiety  5. Next appointment: 9/7 at 2:30

## 2020-04-10 ENCOUNTER — Ambulatory Visit: Payer: Medicare Other | Admitting: Nurse Practitioner

## 2020-04-11 ENCOUNTER — Encounter: Payer: Self-pay | Admitting: Family Medicine

## 2020-05-21 NOTE — Progress Notes (Signed)
Virtual Visit via Video Note  I connected with Samantha Dougherty on 05/27/20 at  2:30 PM EDT by a video enabled telemedicine application and verified that I am speaking with the correct person using two identifiers.   I discussed the limitations of evaluation and management by telemedicine and the availability of in person appointments. The patient expressed understanding and agreed to proceed.    I discussed the assessment and treatment plan with the patient. The patient was provided an opportunity to ask questions and all were answered. The patient agreed with the plan and demonstrated an understanding of the instructions.   The patient was advised to call back or seek an in-person evaluation if the symptoms worsen or if the condition fails to improve as anticipated.  Location: patient- home, provider- office   I provided 20 minutes of non-face-to-face time during this encounter.   Samantha Hotter, MD    [facility] MD/PA/NP OP Progress Note  05/27/2020 4:18 PM Samantha Dougherty  MRN:  [id number]  Chief Complaint:  Chief Complaint    Follow-up; Depression; Anxiety     HPI:  This is a follow-up appointment for depression.  She states that her health has been "inconsistent," which she refers to worsening in myalgia. She also has abdominal pain, and is scheduled to have MRI tomorrow based on some finding in ultrasound (per chart review, she was found to have adenomyosis).  She continues to have seizure-like episode, which often starts with nausea, followed by  shaking, disorientation and confusion. However, she has been able to play basket ball more often compared to before. She wishes to have more better days. She reports good relationship with her parents and her sister.  She reports insomnia.  She feels fatigue.  She has occasional anhedonia, although she has been able to enjoy going to picnic.  She has fair concentration.  She has good appetite.  She denies SI.  She feels more anxious.  She has intense  anxiety at times without significant triggers.  She reports improvement in constipation since the last visit, and is willing to try higher dose of venlafaxine.  She is interested in seeing a therapist.   Daily routine: plays basket ball at times when she does not have pain Employment: unemployed Household: parents, her 69 year old sister Marital status: single  Visit Diagnosis:    ICD-10-CM   1. GAD (generalized anxiety disorder)  F41.1   2. Mild episode of recurrent major depressive disorder (HCC)  F33.0     Past Psychiatric History: Please see initial evaluation for full details. I have reviewed the history. No updates at this time.    Past Medical History:  Past Medical History:  Diagnosis Date  . Anti-phospholipid antibody syndrome (HCC)   . Anxiety   . Asthma   . Bipolar 2 disorder (HCC)   . Conversion disorder   . Depression   . DVT (deep venous thrombosis) (HCC)    LLE  . Fibromyalgia   . Gallbladder polyp   . H. pylori infection   . IBS (irritable bowel syndrome)   . Legal blindness    Right eye  . Migraine   . Mood disorder (HCC)   . Psychogenic nonepileptic seizure   . Urinary tract infection     Past Surgical History:  Procedure Laterality Date  . COLONOSCOPY WITH ESOPHAGOGASTRODUODENOSCOPY (EGD)    . HYMENECTOMY  2016    Family Psychiatric History: Please see initial evaluation for full details. I have reviewed the history.  No updates at this time.     Family History:  Family History  Problem Relation Age of Onset  . Anxiety disorder Mother   . Depression Mother   . Hyperlipidemia Mother   . Bipolar disorder Mother   . Thyroid disease Maternal Grandmother   . Deep vein thrombosis Maternal Grandmother   . Thyroid disease Maternal Grandfather   . Anxiety disorder Father   . Depression Father   . Autism Sister   . Other Sister        [place] syndrome  . Factor V Leiden deficiency Maternal Aunt   . Breast cancer Maternal Aunt   . Bipolar  disorder Maternal Aunt   . Diabetes Paternal Grandfather   . Deep vein thrombosis Maternal Aunt   . Bipolar disorder Maternal Aunt   . Colon cancer Neg Hx   . Esophageal cancer Neg Hx   . Rectal cancer Neg Hx     Social History:  Social History   Socioeconomic History  . Marital status: Single    Spouse name: Not on file  . Number of children: 0  . Years of education: Not on file  . Highest education level: Not on file  Occupational History  . Occupation: Consulting civil engineer  Tobacco Use  . Smoking status: Never Smoker  . Smokeless tobacco: Never Used  Vaping Use  . Vaping Use: Never used  Substance and Sexual Activity  . Alcohol use: Never  . Drug use: Never  . Sexual activity: Yes    Birth control/protection: None, Condom  Other Topics Concern  . Not on file  Social History Narrative   From [place].  Was receiving medical care at [facility].  Resides with mother.   Social Determinants of Health   Financial Resource Strain:   . Difficulty of Paying Living Expenses: Not on file  Food Insecurity:   . Worried About Programme researcher, broadcasting/film/video in the Last Year: Not on file  . Ran Out of Food in the Last Year: Not on file  Transportation Needs:   . Lack of Transportation (Medical): Not on file  . Lack of Transportation (Non-Medical): Not on file  Physical Activity:   . Days of Exercise per Week: Not on file  . Minutes of Exercise per Session: Not on file  Stress:   . Feeling of Stress : Not on file  Social Connections:   . Frequency of Communication with Friends and Family: Not on file  . Frequency of Social Gatherings with Friends and Family: Not on file  . Attends Religious Services: Not on file  . Active Member of Clubs or Organizations: Not on file  . Attends Banker Meetings: Not on file  . Marital Status: Not on file    Allergies:  Allergies  Allergen Reactions  . Sulfa Antibiotics Anaphylaxis  . Amitriptyline     Hallucinations    Metabolic Disorder Labs: Lab  Results  Component Value Date   HGBA1C 5.2 08/30/2018   No results found for: PROLACTIN Lab Results  Component Value Date   CHOL 171 04/14/2018   TRIG 140 04/14/2018   HDL 50 04/14/2018   CHOLHDL 3.4 04/14/2018   LDLCALC 93 04/14/2018   Lab Results  Component Value Date   TSH 0.737 08/30/2018    Therapeutic Level Labs: No results found for: LITHIUM No results found for: VALPROATE No components found for:  CBMZ  Current Medications: Current Outpatient Medications  Medication Sig Dispense Refill  . ALPRAZolam (XANAX) 0.5 MG  tablet Take 1 tablet (0.5 mg total) by mouth 2 (two) times daily as needed for anxiety. 60 tablet 1  . [START ON 06/20/2020] Brexpiprazole (REXULTI) 0.5 MG TABS Take 1 tablet (0.5 mg total) by mouth every evening. 90 tablet 0  . [START ON 07/10/2020] lamoTRIgine (LAMICTAL) 100 MG tablet Take 1 tablet (100 mg total) by mouth 2 (two) times daily. 180 tablet 0  . venlafaxine XR (EFFEXOR-XR) 75 MG 24 hr capsule Take 1 capsule (75 mg total) by mouth daily with breakfast. 30 capsule 1   No current facility-administered medications for this visit.     Musculoskeletal: Strength & Muscle Tone: N/A Gait & Station: N/A Patient leans: N/A  Psychiatric Specialty Exam: Review of Systems  Psychiatric/Behavioral: Positive for dysphoric mood and sleep disturbance. Negative for agitation, behavioral problems, confusion, decreased concentration, hallucinations, self-injury and suicidal ideas. The patient is nervous/anxious. The patient is not hyperactive.   All other systems reviewed and are negative.   There were no vitals taken for this visit.There is no height or weight on file to calculate BMI.  General Appearance: Fairly Groomed  Eye Contact:  Good  Speech:  Clear and Coherent  Volume:  Normal  Mood:  better  Affect:  Appropriate, Congruent and slightly down  Thought Process:  Coherent  Orientation:  Full (Time, Place, and Person)  Thought Content: Logical    Suicidal Thoughts:  No  Homicidal Thoughts:  No  Memory:  Immediate;   Good  Judgement:  Good  Insight:  Good  Psychomotor Activity:  Normal  Concentration:  Concentration: Good and Attention Span: Good  Recall:  Good  Fund of Knowledge: Good  Language: Good  Akathisia:  No  Handed:  Right  AIMS (if indicated): not done  Assets:  Communication Skills Desire for Improvement  ADL's:  Intact  Cognition: WNL  Sleep:  Poor   Screenings: GAD-7     [facility]  Total GAD-7 Score 17    PHQ2-9     [facility]  PHQ-2 Total Score 6 3 2 3 6   PHQ-9 Total Score 18 15 12 13 20        Assessment and Plan:  Samantha EarlsLyla D. Rybolt is a 24 y.o. 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   1. GAD (generalized anxiety disorder) 2. Mild episode of recurrent major depressive disorder (HCC) She continues to report depressive symptoms and anxiety since the last visit.  Psychosocial stressors includes medical condition of myalgia, seizure-like episode, and she is currently under evaluation of pelvic pain.  Will uptitrate venlafaxine to optimize its benefit for anxiety and depression.  Discussed potential risk of headache, and worsening in constipation.  Will continue rexulti as adjunctive treatment for depression.  Discussed potential metabolic side effect.  Will continue lamotrigine for mood dysregulation.  Noted that this medication has been prescribed by neurology, although there was no indication for seizure.   Although this medication may be tapered off in the future, she has strong preference to stay on the current dose.  Discussed risk of Stevens-Johnson syndrome.  Will continue Xanax as needed for anxiety.  She  is aware of its risk of dependence and oversedation.  She will greatly benefit from CBT; will make referral.   Plan I have reviewed and updated plans as below 1. Increase venlafaxine 75 mg daily  2.Continue Rexulti 0.5 mg daily 3.Continue lamotrigine 100 mg twice a day 4. Continue Xanax 0.5 mg twice a day as needed for anxiety - she is agreeable to take lower dose (two refills left) 5. Next appointment: 10/26 at 2:30 PM for 30 mins, video - Referral for therapy   I have utilized the [place] Controlled Substances Reporting System (PMP AWARxE) to confirm adherence regarding the patient's medication. My review reveals appropriate prescription fills.  (Although she was prescribed Valium for pelvic pain, she states that she does not take this medication anymore. )   Past trials of medication:sertraline,fluoxetine,Lexapro,duloxetine (headache, nightmares),elavil (hallucinations), Rexulti, Abilify, Xanax, lamotrigine, temazepam  The patient demonstrates the following risk factors for suicide: Chronic risk factors for suicide include:psychiatric disorder ofdepressionand chronic pain. Acute risk factorsfor suicide include: unemployment and social withdrawal/isolation. Protective factorsfor this patient include: positive social support, coping skills and hope for the future. Considering these factors, the overall suicide risk at this point appears to below. Patientisappropriate for outpatient follow up.  Samantha Hotter, MD 05/27/2020, 4:18 PM

## 2020-05-27 ENCOUNTER — Telehealth (INDEPENDENT_AMBULATORY_CARE_PROVIDER_SITE_OTHER): Payer: Medicare Other | Admitting: Psychiatry

## 2020-05-27 ENCOUNTER — Encounter (HOSPITAL_COMMUNITY): Payer: Self-pay | Admitting: Psychiatry

## 2020-05-27 ENCOUNTER — Other Ambulatory Visit: Payer: Self-pay

## 2020-05-27 DIAGNOSIS — F330 Major depressive disorder, recurrent, mild: Secondary | ICD-10-CM | POA: Diagnosis not present

## 2020-05-27 DIAGNOSIS — F411 Generalized anxiety disorder: Secondary | ICD-10-CM

## 2020-05-27 MED ORDER — REXULTI 0.5 MG PO TABS: 0.5000 mg | ORAL_TABLET | Freq: Every evening | ORAL | 0 refills | Status: DC

## 2020-05-27 MED ORDER — LAMOTRIGINE 100 MG PO TABS: 100.0000 mg | ORAL_TABLET | Freq: Two times a day (BID) | ORAL | 0 refills | Status: DC

## 2020-05-27 MED ORDER — VENLAFAXINE HCL ER 75 MG PO CP24: 75.0000 mg | ORAL_CAPSULE | Freq: Every day | ORAL | 1 refills | Status: DC

## 2020-05-27 NOTE — Patient Instructions (Signed)
1. Increase venlafaxine 75 mg daily  2.Continue Rexulti 0.5 mg daily 3.Continue lamotrigine 100 mg twice a day 4. Continue Xanax 0.5 mg twice a day as needed for anxiety  5. Next appointment: 10/26 at 2:30 PM

## 2020-06-02 ENCOUNTER — Other Ambulatory Visit: Payer: Self-pay

## 2020-06-02 ENCOUNTER — Ambulatory Visit: Payer: Medicare Other | Attending: Student | Admitting: Physical Therapy

## 2020-06-02 ENCOUNTER — Encounter: Payer: Self-pay | Admitting: Physical Therapy

## 2020-06-02 DIAGNOSIS — R252 Cramp and spasm: Secondary | ICD-10-CM | POA: Diagnosis present

## 2020-06-02 DIAGNOSIS — R278 Other lack of coordination: Secondary | ICD-10-CM

## 2020-06-02 NOTE — Patient Instructions (Signed)
   The "Pelvic Drop" to Release Pelvic Floor Tension: Three Visualizations     Guided Meditation for Pelvic Floor Relaxation  [employer]   Pelvic Floor Release Stretches  [employer]    Pelvic Floor Release Stretches (NEW)  [employer]   

## 2020-06-02 NOTE — Therapy (Addendum)
[facility] [address], [place], [place], [address] Phone: [phone]   Fax:  [phone]  Physical Therapy Evaluation  Patient Details  Name: Samantha Dougherty MRN: [id number] Date of Birth: 10-24-1995 Referring Provider (PT): Janece Canterbury, MD   Encounter Date: 06/02/2020   PT End of Session - 06/02/20 1359    Visit Number 1    Date for PT Re-Evaluation 07/28/20    Authorization Type Medicare/Medicaid    Progress Note Due on Visit 10    PT Start Time 1100    PT Stop Time 1145    PT Time Calculation (min) 45 min    Activity Tolerance Patient tolerated treatment well;Patient limited by pain    Behavior During Therapy [facility] for tasks assessed/performed           Past Medical History:  Diagnosis Date  . Anti-phospholipid antibody syndrome (HCC)   . Anxiety   . Asthma   . Bipolar 2 disorder ([place])   . Conversion disorder   . Depression   . DVT (deep venous thrombosis) (HCC)    LLE  . Fibromyalgia   . Gallbladder polyp   . H. pylori infection   . IBS (irritable bowel syndrome)   . Legal blindness    Right eye  . Migraine   . Mood disorder ([place])   . Psychogenic nonepileptic seizure   . Urinary tract infection     Past Surgical History:  Procedure Laterality Date  . COLONOSCOPY WITH ESOPHAGOGASTRODUODENOSCOPY (EGD)    . HYMENECTOMY  2016    There were no vitals filed for this visit.    Subjective Assessment - 06/02/20 1101    Subjective Pt referred to OPPT for lower abdominal tightness and discomfort.  Pain with ovulation.  Difficulty initiating urination and defecation.  Pain with toileting.  Some pushing on lower abdomen to empty urine.  Ongoing straining with BMs daily.    Pertinent History endometriosis, IBS, ovarian cyst removal bil and appendectomy 09/2019 POTS, migraine, no IUD, DVT, seizures, lyme disease, anxiety, depression, scoliosis    Limitations Walking    How long can you walk  comfortably? avoids if in high levels of pain    Diagnostic tests sees urologist end of the month, upcoming MRI for endometriosis to f/u on US findings    Currently in Pain? Yes    Pain Score 5    can get up to an 8/10   Pain Location Abdomen    Pain Orientation Lower;Anterior    Pain Descriptors / Indicators Sharp;Tightness;Cramping    Pain Type Chronic pain;Acute pain    Pain Radiating Towards lower back bil    Pain Onset More than a month ago    Pain Frequency Constant    Aggravating Factors  urinating, defecating, sitting if pain is high, walking if pain is high    Pain Relieving Factors heating pad, lay on back    Effect of Pain on Daily Activities in bed most of the day, can't apply for college              [facility] PT Assessment - 06/02/20 0001      Assessment   Medical Diagnosis M79.18 (ICD-10-CM) - Myalgia of pelvic floor    Referring Provider (PT) Otho Perl, Liliana Cline, MD    Onset Date/Surgical Date --   when Pt was 25   Next MD Visit --   urologist end of the month   Prior Therapy no  Precautions   Precautions Other (comment)    Precaution Comments POTS      Restrictions   Weight Bearing Restrictions No      Balance Screen   Has the patient fallen in the past 6 months No      [place] residence    Living Arrangements Parent;Other relatives    Type of Home House      Prior Function   Level of Independence Independent;Needs assistance with ADLs    Vocation Unemployed    Leisure basketball      Cognition   Overall Cognitive Status Within Functional Limits for tasks assessed      Observation/Other Assessments   Observations Pt with limited excursion mobility of PF for contract/relax/bulge    Scoliosis mild scoliosis present      ROM / Strength   AROM / PROM / Strength AROM;PROM;Strength      AROM   Overall AROM Comments trunk ROM WFL with Rt sided LBP with flexion and bil SB      PROM   Overall PROM Comments bil  hips WNL with bil buttock pain on flexion/adduction       Strength   Overall Strength Comments hip flexors and ER 4/5 bil, all others 5/5 bil LEs, core weakness present 4/5      Flexibility   Soft Tissue Assessment /Muscle Length no      Palpation   SI assessment  WNL bil    Palpation comment tender Rt lower abdominal quadrant                       Objective measurements completed on examination: See above findings.     Pelvic Floor Special Questions - 06/02/20 0001    Prior Pelvic/Prostate Exam Yes    Result Pelvic/Prostate Exam  this year, tightness but normal    Prior Urinalysis No    Are you Pregnant or attempting pregnancy? No    Prior Pregnancies No    Currently Sexually Active No    History of sexually transmitted disease No    Urinary Leakage No    Urinary urgency No    Urinary frequency every 2 hours    Fecal incontinence No    Fluid intake water, OJ, cranberry juice    Caffeine beverages rarely soda    Falling out feeling (prolapse) No    External Perineal Exam PT explained assessment and obtained verbal consent    Perineal Body/Introitus  Elevated    External Palpation tender bil STP, introitus, levator ani    Pelvic Floor Internal Exam PT assessed vaginal introitus only secondary to pain    Exam Type Deferred            [facility] Adult PT Treatment/Exercise - 06/02/20 0001      Self-Care   Self-Care Other Self-Care Comments    Other Self-Care Comments  FemFusion videos, diaphragmatic breathing 5/3/5 cycle x 2-3 min with legs elevated and heat on abdomen or lumbar region 2x/day                  PT Education - 06/02/20 1133    Education Details FemFusion videos for pelvic drop, meditation and stretching.  Diaphragmatic breathing with 5/3/5 pattern 2-3 min 2x/day    Person(s) Educated Patient    Methods Explanation;Handout;Verbal cues    Comprehension Verbalized understanding            PT Short Term Goals - 06/02/20 1418  PT  SHORT TERM GOAL #1   Title Pt will be ind with HEP including pelvic meditation, breathwork and pelvic stretching.    Time 2    Period Weeks    Status New    Target Date 06/16/20      PT SHORT TERM GOAL #2   Title Pt will be instructed in proper toileting techniques for improved ability to initiate urination and have a BM with less straining.    Time 3    Period Weeks    Status New    Target Date 06/23/20             PT Long Term Goals - 06/02/20 1419      PT LONG TERM GOAL #1   Title Pt will be ind with self-care strategies and HEP to reduce pain and optimize mobility and function of the pelvic floor.    Time 8    Period Weeks    Status New    Target Date 07/28/20      PT LONG TERM GOAL #2   Title Pt will demo good excursion of pelvic floor bulge for improved toileting.    Time 8    Period Weeks    Status New    Target Date 07/28/20      PT LONG TERM GOAL #3   Title Pt will be achieve at least 4+/5 core and hip strength for increased daily activity tolerance.    Time 8    Period Weeks    Status New    Target Date 07/28/20      PT LONG TERM GOAL #4   Title Pt will report reduced abdominal and low back pain by at least 50% using strategies learned in PT.    Time 8    Period Weeks    Status New    Target Date 07/28/20      PT LONG TERM GOAL #5   Title Pt will demo proper body mechanics for functional tasks such as bending, squatting, lifting to reduce low back pain with these tasks.    Time 8    Period Weeks    Status New    Target Date 07/28/20                  Plan - 06/02/20 1400    Clinical Impression Statement Pt is a 24yo female with complex medical history referred to PT for myalgia of the pelvic floor.  She has been diagnosed with endometriosis and had ovarian cysts removed in Jan 2021.  She experiences abdominal and low back pain with ovulation and with toileting of both urine and stool.  She has to strain to urinate and defecate.  She has a BM  daily.  She urinates every 2 hours.  She has never been sexually active.  She presents with good mobility of trunk and bil hips.  She has Rt>Lt LBP with lumbar flexion and bil SB.  She has core weakness and weakness in bil hip flexors and hip ER/abd 4/5.  She has tenderness in Rt lower abdominal quadrant and bil PF layer 1 and levator ani.  PT deferred internal exam beyond vaginal introitus secondary to Pt's pain levels and tension.  Observation of contract/relax/bulge demonstated limited excursion of movement with high resting tone.  PT initiated diaphragmatic breathing and gave YouTube videos for FemFusion pelvic meditation and stretching.  Pt will benefit from skilled PT to address pain and optimize pelvic floor function and tone.  Personal Factors and Comorbidities Comorbidity 1;Comorbidity 2;Comorbidity 3+;Time since onset of injury/illness/exacerbation;Profession    Comorbidities unable to work or go to college, lyme disease, endometriosis, IBS, POTS, anxiety/depression    Examination-Activity Limitations Toileting;Bend;Sit    Examination-Participation Restrictions Engineer, drilling;Shop    Stability/Clinical Decision Making Evolving/Moderate complexity    Clinical Decision Making Moderate    Rehab Potential Good    PT Frequency 1x / week    PT Duration 8 weeks    PT Treatment/Interventions ADLs/Self Care Home Management;Electrical Stimulation;Biofeedback;Moist Heat;Patient/family education;Neuromuscular re-education;Joint Manipulations;Spinal Manipulations;Manual techniques;Therapeutic exercise;Functional mobility training    PT Next Visit Plan pelvic floor stretches, stim/heat abdomen/lumbar with breathwork, work on pelvic drop and bulge, toileting techniques (biofeedback in sitting for relaxation)    PT Home Exercise Plan Fem Fusion videos and 5/3/5 diaphragmatic breathing    Consulted and Agree with Plan of Care Patient           Patient will benefit from skilled  therapeutic intervention in order to improve the following deficits and impairments:  Pain, Decreased activity tolerance, Improper body mechanics, Increased fascial restricitons, Increased muscle spasms, Postural dysfunction, Decreased coordination, Decreased strength  Visit Diagnosis: Cramp and spasm - Plan: PT plan of care cert/re-cert  Other lack of coordination - Plan: PT plan of care cert/re-cert     Problem List Patient Active Problem List   Diagnosis Date Noted  . 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 Pelvic pain 12/13/2018  . History of seizures 12/13/2018  . Encounter for gynecological examination with Papanicolaou smear of cervix 10/19/2018  . Menorrhagia with irregular cycle 10/19/2018  . Cysts of both ovaries 10/19/2018  . Dysmenorrhea 10/19/2018  . Encounter for menstrual regulation 10/19/2018  . 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 Seizure-like activity ([place]) 04/14/2018  . Migraine without status migrainosus, not intractable 04/14/2018  . Chronic low back pain 04/12/2018  . History of DVT of lower extremity 04/12/2018  . 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 POTS (postural orthostatic tachycardia syndrome) 04/12/2018  . Conversion disorder 04/12/2018  . IBS (irritable bowel syndrome) 04/12/2018  . 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 Dextroscoliosis 04/12/2018   Baruch Merl, PT 06/02/20 2:32 PM  PHYSICAL THERAPY DISCHARGE SUMMARY  Visits from Start of Care: 1  Current functional level related to goals / functional outcomes: Pt did not return for any follow up visits.  No shows x last two visits.  Pt will be d/c'd due to cancellation policy.  Will need a new order for more PT.   Remaining deficits: See above   Education / Equipment: See above. Plan: Patient agrees to discharge.  Patient goals were not met. Patient is being discharged due to not returning since the last visit.  ?????         Baruch Merl, PT 06/30/20  11:27 AM   [facility] [address], [place], [place], [address] Phone: [phone]   Fax:  [phone]  Name: Samantha Dougherty MRN: [id number] Date of Birth: 12-31-1995

## 2020-06-09 ENCOUNTER — Ambulatory Visit: Payer: Medicare Other | Admitting: Physical Therapy

## 2020-06-09 ENCOUNTER — Telehealth: Payer: Self-pay | Admitting: Physical Therapy

## 2020-06-09 NOTE — Telephone Encounter (Signed)
Pt missed PT appointment on 06/09/20 at 11:00am.  PT left voicemail for Pt to return our call.  Will need to re-iterate cancellation policy and confirm next visit.  Berdella Bacot, PT 06/09/20 11:33 AM

## 2020-06-16 ENCOUNTER — Other Ambulatory Visit (HOSPITAL_COMMUNITY): Payer: Self-pay | Admitting: Psychiatry

## 2020-06-16 ENCOUNTER — Encounter: Payer: Self-pay | Admitting: Physical Therapy

## 2020-06-23 ENCOUNTER — Encounter: Payer: Self-pay | Admitting: Physical Therapy

## 2020-06-30 ENCOUNTER — Ambulatory Visit: Payer: Medicare Other | Attending: Student | Admitting: Physical Therapy

## 2020-06-30 ENCOUNTER — Telehealth: Payer: Self-pay | Admitting: Physical Therapy

## 2020-06-30 NOTE — Telephone Encounter (Signed)
Pt was a No Show for physical therapy appointments 06/16/20 and 06/23/20.  PT left a voice mail for Pt to return call to clinic.  Pt will be discharged for now due to clinic's cancellation policy.  Pt will need a new order for PT should she wish to participate in further PT.  Auset Fritzler, PT 06/30/20 11:24 AM

## 2020-07-09 NOTE — Progress Notes (Deleted)
BH MD/PA/NP OP Progress Note  07/09/2020 4:41 PM Samantha Dougherty  MRN:  [id number]  Chief Complaint:  HPI: *** Visit Diagnosis: No diagnosis found.  Past Psychiatric History: Please see initial evaluation for full details. I have reviewed the history. No updates at this time.     Past Medical History:  Past Medical History:  Diagnosis Date  . Anti-phospholipid antibody syndrome (HCC)   . Anxiety   . Asthma   . Bipolar 2 disorder (HCC)   . Conversion disorder   . Depression   . DVT (deep venous thrombosis) (HCC)    LLE  . Fibromyalgia   . Gallbladder polyp   . H. pylori infection   . IBS (irritable bowel syndrome)   . Legal blindness    Right eye  . Migraine   . Mood disorder (HCC)   . Psychogenic nonepileptic seizure   . Urinary tract infection     Past Surgical History:  Procedure Laterality Date  . COLONOSCOPY WITH ESOPHAGOGASTRODUODENOSCOPY (EGD)    . HYMENECTOMY  2016    Family Psychiatric History: Please see initial evaluation for full details. I have reviewed the history. No updates at this time.     Family History:  Family History  Problem Relation Age of Onset  . Anxiety disorder Mother   . Depression Mother   . Hyperlipidemia Mother   . Bipolar disorder Mother   . Thyroid disease Maternal Grandmother   . Deep vein thrombosis Maternal Grandmother   . Thyroid disease Maternal Grandfather   . Anxiety disorder Father   . Depression Father   . Autism Sister   . Other Sister        [place] syndrome  . Factor V Leiden deficiency Maternal Aunt   . Breast cancer Maternal Aunt   . Bipolar disorder Maternal Aunt   . Diabetes Paternal Grandfather   . Deep vein thrombosis Maternal Aunt   . Bipolar disorder Maternal Aunt   . Colon cancer Neg Hx   . Esophageal cancer Neg Hx   . Rectal cancer Neg Hx     Social History:  Social History   Socioeconomic History  . Marital status: Single    Spouse name: Not on file  . Number of children: 0  . Years of  education: Not on file  . Highest education level: Not on file  Occupational History  . Occupation: Consulting civil engineer  Tobacco Use  . Smoking status: Never Smoker  . Smokeless tobacco: Never Used  Vaping Use  . Vaping Use: Never used  Substance and Sexual Activity  . Alcohol use: Never  . Drug use: Never  . Sexual activity: Yes    Birth control/protection: None, Condom  Other Topics Concern  . Not on file  Social History Narrative   From [place].  Was receiving medical care at [facility].  Resides with mother.   Social Determinants of Health   Financial Resource Strain:   . Difficulty of Paying Living Expenses: Not on file  Food Insecurity:   . Worried About Programme researcher, broadcasting/film/video in the Last Year: Not on file  . Ran Out of Food in the Last Year: Not on file  Transportation Needs:   . Lack of Transportation (Medical): Not on file  . Lack of Transportation (Non-Medical): Not on file  Physical Activity:   . Days of Exercise per Week: Not on file  . Minutes of Exercise per Session: Not on file  Stress:   . Feeling of Stress :  Not on file  Social Connections:   . Frequency of Communication with Friends and Family: Not on file  . Frequency of Social Gatherings with Friends and Family: Not on file  . Attends Religious Services: Not on file  . Active Member of Clubs or Organizations: Not on file  . Attends Banker Meetings: Not on file  . Marital Status: Not on file    Allergies:  Allergies  Allergen Reactions  . Sulfa Antibiotics Anaphylaxis  . Amitriptyline     Hallucinations    Metabolic Disorder Labs: Lab Results  Component Value Date   HGBA1C 5.2 08/30/2018   No results found for: PROLACTIN Lab Results  Component Value Date   CHOL 171 04/14/2018   TRIG 140 04/14/2018   HDL 50 04/14/2018   CHOLHDL 3.4 04/14/2018   LDLCALC 93 04/14/2018   Lab Results  Component Value Date   TSH 0.737 08/30/2018    Therapeutic Level Labs: No results found for: LITHIUM No  results found for: VALPROATE No components found for:  CBMZ  Current Medications: Current Outpatient Medications  Medication Sig Dispense Refill  . ALPRAZolam (XANAX) 0.5 MG tablet Take 1 tablet (0.5 mg total) by mouth 2 (two) times daily as needed for anxiety. 60 tablet 1  . Brexpiprazole (REXULTI) 0.5 MG TABS Take 1 tablet (0.5 mg total) by mouth every evening. 90 tablet 0  . [START ON 07/10/2020] lamoTRIgine (LAMICTAL) 100 MG tablet Take 1 tablet (100 mg total) by mouth 2 (two) times daily. 180 tablet 0  . venlafaxine XR (EFFEXOR-XR) 75 MG 24 hr capsule Take 1 capsule (75 mg total) by mouth daily with breakfast. 30 capsule 1   No current facility-administered medications for this visit.     Musculoskeletal: Strength & Muscle Tone: N/A Gait & Station: N/A Patient leans: N/A  Psychiatric Specialty Exam: Review of Systems  There were no vitals taken for this visit.There is no height or weight on file to calculate BMI.  General Appearance: {Appearance:22683}  Eye Contact:  {BHH EYE CONTACT:22684}  Speech:  Clear and Coherent  Volume:  Normal  Mood:  {BHH MOOD:22306}  Affect:  {Affect (PAA):22687}  Thought Process:  Coherent  Orientation:  Full (Time, Place, and Person)  Thought Content: Logical   Suicidal Thoughts:  {ST/HT (PAA):22692}  Homicidal Thoughts:  {ST/HT (PAA):22692}  Memory:  Immediate;   Good  Judgement:  {Judgement (PAA):22694}  Insight:  {Insight (PAA):22695}  Psychomotor Activity:  Normal  Concentration:  Concentration: Good and Attention Span: Good  Recall:  Good  Fund of Knowledge: Good  Language: Good  Akathisia:  No  Handed:  Right  AIMS (if indicated): not done  Assets:  Communication Skills Desire for Improvement  ADL's:  Intact  Cognition: WNL  Sleep:  {BHH GOOD/FAIR/POOR:22877}   Screenings: GAD-7     Office Visit from 02/14/2020 in [facility]  Total GAD-7 Score 17    PHQ2-9     Office Visit from 02/14/2020 in  [facility] Office Visit from 10/19/2018 in [facility] Office Visit from 08/30/2018 in [facility] Office Visit from 08/22/2018 in [facility] Office Visit from 07/17/2018 in [facility]  PHQ-2 Total Score 6 3 2 3 6   PHQ-9 Total Score 18 15 12 13 20        Assessment and Plan:  Najla Aughenbaugh is a 24 y.o. year old female with a history of depression,non epileptic seizure,hypercoagulable state,Lupus anticoagulant disorder  with history of DVT, who presents for follow up appointment for below.   1. GAD (generalized anxiety disorder) 2. Mild episode of recurrent major depressive disorder (HCC) She continues to report depressive symptoms and anxiety since the last visit.  Psychosocial stressors includes medical condition of myalgia, seizure-like episode, and she is currently under evaluation of pelvic pain.  Will uptitrate venlafaxine to optimize its benefit for anxiety and depression.  Discussed potential risk of headache, and worsening in constipation.  Will continue rexulti as adjunctive treatment for depression.  Discussed potential metabolic side effect.  Will continue lamotrigine for mood dysregulation.  Noted that this medication has been prescribed by neurology, although there was no indication for seizure.  Although this medication may be tapered off in the future, she has strong preference to stay on the current dose.  Discussed risk of Stevens-Johnson syndrome.  Will continue Xanax as needed for anxiety.  She is aware of its risk of dependence and oversedation.  She will greatly benefit from CBT; will make referral.   Plan  1.Increase venlafaxine 75 mg daily 2.Continue Rexulti 0.5 mg daily 3.Continue lamotrigine 100 mg twice a day 4. Continue Xanax 0.5 mg twice a day as needed for anxiety - she is agreeable to take lower dose (two refills left) 5. Next appointment: 10/26at 2:30PM for  30 mins, video - Referral for therapy    Past trials of medication:sertraline,fluoxetine,Lexapro,duloxetine (headache, nightmares),elavil (hallucinations), Rexulti, Abilify, Xanax, lamotrigine, temazepam  The patient demonstrates the following risk factors for suicide: Chronic risk factors for suicide include:psychiatric disorder ofdepressionand chronic pain. Acute risk factorsfor suicide include: unemployment and social withdrawal/isolation. Protective factorsfor this patient include: positive social support, coping skills and hope for the future. Considering these factors, the overall suicide risk at this point appears to below. Patientisappropriate for outpatient follow up.  Neysa Hotter, MD 07/09/2020, 4:41 PM

## 2020-07-15 ENCOUNTER — Telehealth (HOSPITAL_COMMUNITY): Payer: Medicare Other | Admitting: Psychiatry

## 2020-07-15 ENCOUNTER — Telehealth (HOSPITAL_COMMUNITY): Payer: Self-pay | Admitting: Psychiatry

## 2020-07-15 ENCOUNTER — Other Ambulatory Visit: Payer: Self-pay

## 2020-07-15 NOTE — Telephone Encounter (Signed)
Sent link for video visit through Epic. Patient did not sign in. Called the patient  for appointment scheduled today. The patient did not answer the phone. Left voice message to contact the office.  

## 2020-07-17 DIAGNOSIS — N809 Endometriosis, unspecified: Secondary | ICD-10-CM | POA: Insufficient documentation

## 2020-07-17 DIAGNOSIS — R3982 Chronic bladder pain: Secondary | ICD-10-CM | POA: Insufficient documentation

## 2020-08-08 ENCOUNTER — Other Ambulatory Visit: Payer: Medicare Other

## 2020-08-08 ENCOUNTER — Other Ambulatory Visit: Payer: Self-pay

## 2020-09-08 ENCOUNTER — Telehealth: Payer: Self-pay | Admitting: *Deleted

## 2020-09-08 NOTE — Telephone Encounter (Signed)
Pharmacy called and requested refill of Lamictal for patient.  Notified patient she needs to make follow up appt.

## 2020-09-08 NOTE — Telephone Encounter (Signed)
Will not order this for now as she should have enough medication for the next few weeks.

## 2020-09-09 NOTE — Telephone Encounter (Signed)
ok 

## 2020-09-16 ENCOUNTER — Telehealth (HOSPITAL_COMMUNITY): Payer: Self-pay

## 2020-09-16 ENCOUNTER — Other Ambulatory Visit (HOSPITAL_COMMUNITY): Payer: Self-pay | Admitting: Psychiatry

## 2020-09-16 MED ORDER — REXULTI 0.5 MG PO TABS: 0.5000 mg | ORAL_TABLET | Freq: Every evening | ORAL | 0 refills | Status: DC

## 2020-09-16 MED ORDER — VENLAFAXINE HCL ER 75 MG PO CP24: 75.0000 mg | ORAL_CAPSULE | Freq: Every day | ORAL | 0 refills | Status: DC

## 2020-09-16 MED ORDER — ALPRAZOLAM 0.5 MG PO TABS: 0.5000 mg | ORAL_TABLET | Freq: Two times a day (BID) | ORAL | 0 refills | Status: DC | PRN

## 2020-09-16 MED ORDER — LAMOTRIGINE 100 MG PO TABS: 100.0000 mg | ORAL_TABLET | Freq: Two times a day (BID) | ORAL | 0 refills | Status: DC

## 2020-09-16 NOTE — Telephone Encounter (Signed)
sent 

## 2020-09-16 NOTE — Telephone Encounter (Signed)
Medication management - generic message left for pt her refills requested had been sent into her [facility] pharmacy as requested and that she should be getting a call to schedule an appt with Dr. Vanetta Shawl for January.

## 2020-09-16 NOTE — Telephone Encounter (Signed)
Appointment and medication refill - Pt came into the [place] office today with her address verificaiton using a [employer], [employer] bill and new rental lease.Requests a new appt with Dr. Vanetta Shawl and refills until can be seen again.Agreed to send request to provider covering for Dr. Vanetta Shawl as she is out of the office this week.

## 2020-09-22 NOTE — Telephone Encounter (Signed)
Message left for patient she would need to call back to set up a new appointment with Dr. Vanetta Shawl.

## 2020-10-16 ENCOUNTER — Encounter: Payer: Self-pay | Admitting: Family Medicine

## 2020-10-16 NOTE — Progress Notes (Signed)
Virtual Visit via Video Note  I connected with Samantha Dougherty on 10/20/20 at  2:00 PM EST by a video enabled telemedicine application and verified that I am speaking with the correct person using two identifiers.  Location: Patient: home Provider: office Persons participated in the visit- patient, provider   I discussed the limitations of evaluation and management by telemedicine and the availability of in person appointments. The patient expressed understanding and agreed to proceed.     I discussed the assessment and treatment plan with the patient. The patient was provided an opportunity to ask questions and all were answered. The patient agreed with the plan and demonstrated an understanding of the instructions.   The patient was advised to call back or seek an in-person evaluation if the symptoms worsen or if the condition fails to improve as anticipated.  I provided 20 minutes of non-face-to-face time during this encounter.   Samantha Hotter, MD    [facility] MD/PA/NP OP Progress Note  10/20/2020 2:46 PM Samantha Dougherty  MRN:  [id number]  Chief Complaint:  Chief Complaint    Follow-up; Anxiety; Depression     HPI:  This is a follow-up appointment for depression and anxiety.  She states that she has not been doing well.  She is having more panic attacks, which she reports as worse than she ever had.  She states that she continues to struggle with pelvic pain, which she attributes to endometriosis.  Although she has been prescribed a medication, it has not worked well.  She tends to spend time lying or resting in the house.  She would definitely be more active, playing more sports or going to hiking if she were not to have any pain.  She agrees to try some activity she can do while balancing with her pain.  She has insomnia.  She feels depressed.  She has difficulty in concentration.  She denies change in appetite or weight gain.  She denies SI.  She has not noticed any side effect from  Effexor.  She feels that Xanax helps her only for short period of time. she would like to first switch from Xanax to clonazepam before increase of the dose of Effexor. She denies alcohol use or drug use.    Daily routine: plays basket ball at times when she does not have pain Employment: unemployed Household: parents, her 50 year old sister Marital status: single   Visit Diagnosis:    ICD-10-CM   1. GAD (generalized anxiety disorder)  F41.1   2. Mild episode of recurrent major depressive disorder (HCC)  F33.0     Past Psychiatric History: Please see initial evaluation for full details. I have reviewed the history. No updates at this time.     Past Medical History:  Past Medical History:  Diagnosis Date  . Anti-phospholipid antibody syndrome (HCC)   . Anxiety   . Asthma   . Bipolar 2 disorder (HCC)   . Conversion disorder   . Depression   . DVT (deep venous thrombosis) (HCC)    LLE  . Fibromyalgia   . Gallbladder polyp   . H. pylori infection   . IBS (irritable bowel syndrome)   . Legal blindness    Right eye  . Migraine   . Mood disorder (HCC)   . Psychogenic nonepileptic seizure   . Urinary tract infection     Past Surgical History:  Procedure Laterality Date  . COLONOSCOPY WITH ESOPHAGOGASTRODUODENOSCOPY (EGD)    . HYMENECTOMY  2016  Family Psychiatric History: Please see initial evaluation for full details. I have reviewed the history. No updates at this time.     Family History:  Family History  Problem Relation Age of Onset  . Anxiety disorder Mother   . Depression Mother   . Hyperlipidemia Mother   . Bipolar disorder Mother   . Thyroid disease Maternal Grandmother   . Deep vein thrombosis Maternal Grandmother   . Thyroid disease Maternal Grandfather   . Anxiety disorder Father   . Depression Father   . Autism Sister   . Other Sister        [place] syndrome  . Factor V Leiden deficiency Maternal Aunt   . Breast cancer Maternal Aunt   .  Bipolar disorder Maternal Aunt   . Diabetes Paternal Grandfather   . Deep vein thrombosis Maternal Aunt   . Bipolar disorder Maternal Aunt   . Colon cancer Neg Hx   . Esophageal cancer Neg Hx   . Rectal cancer Neg Hx     Social History:  Social History   Socioeconomic History  . Marital status: Single    Spouse name: Not on file  . Number of children: 0  . Years of education: Not on file  . Highest education level: Not on file  Occupational History  . Occupation: Consulting civil engineer  Tobacco Use  . Smoking status: Never Smoker  . Smokeless tobacco: Never Used  Vaping Use  . Vaping Use: Never used  Substance and Sexual Activity  . Alcohol use: Never  . Drug use: Never  . Sexual activity: Yes    Birth control/protection: None, Condom  Other Topics Concern  . Not on file  Social History Narrative   From [place].  Was receiving medical care at [facility].  Resides with mother.   Social Determinants of Health   Financial Resource Strain: Not on file  Food Insecurity: Not on file  Transportation Needs: Not on file  Physical Activity: Not on file  Stress: Not on file  Social Connections: Not on file    Allergies:  Allergies  Allergen Reactions  . Sulfa Antibiotics Anaphylaxis  . Amitriptyline     Hallucinations    Metabolic Disorder Labs: Lab Results  Component Value Date   HGBA1C 5.2 08/30/2018   No results found for: PROLACTIN Lab Results  Component Value Date   CHOL 171 04/14/2018   TRIG 140 04/14/2018   HDL 50 04/14/2018   CHOLHDL 3.4 04/14/2018   LDLCALC 93 04/14/2018   Lab Results  Component Value Date   TSH 0.737 08/30/2018    Therapeutic Level Labs: No results found for: LITHIUM No results found for: VALPROATE No components found for:  CBMZ  Current Medications: Current Outpatient Medications  Medication Sig Dispense Refill  . LORazepam (ATIVAN) 0.5 MG tablet Take 1 tablet (0.5 mg total) by mouth 2 (two) times daily as needed for anxiety. 60 tablet 1  .  Brexpiprazole (REXULTI) 0.5 MG TABS Take 1 tablet (0.5 mg total) by mouth every evening. 90 tablet 0  . lamoTRIgine (LAMICTAL) 100 MG tablet Take 1 tablet (100 mg total) by mouth 2 (two) times daily. 180 tablet 0  . venlafaxine XR (EFFEXOR-XR) 75 MG 24 hr capsule Take 1 capsule (75 mg total) by mouth daily with breakfast. 30 capsule 1   No current facility-administered medications for this visit.     Musculoskeletal: Strength & Muscle Tone: N/A Gait & Station: N/A Patient leans: N/A  Psychiatric Specialty Exam: Review of Systems  Psychiatric/Behavioral: Positive for decreased concentration, dysphoric mood and sleep disturbance. Negative for agitation, behavioral problems, confusion, hallucinations, self-injury and suicidal ideas. The patient is nervous/anxious. The patient is not hyperactive.   All other systems reviewed and are negative.   There were no vitals taken for this visit.There is no height or weight on file to calculate BMI.  General Appearance: Fairly Groomed  Eye Contact:  Good  Speech:  Clear and Coherent  Volume:  Normal  Mood:  "not good"  Affect:  Appropriate, Congruent and slightly down  Thought Process:  Coherent  Orientation:  Full (Time, Place, and Person)  Thought Content: Logical   Suicidal Thoughts:  No  Homicidal Thoughts:  No  Memory:  Immediate;   Good  Judgement:  Good  Insight:  Good  Psychomotor Activity:  Normal  Concentration:  Concentration: Good and Attention Span: Good  Recall:  Good  Fund of Knowledge: Good  Language: Good  Akathisia:  No  Handed:  Right  AIMS (if indicated): not done  Assets:  Communication Skills Desire for Improvement  ADL's:  Intact  Cognition: WNL  Sleep:  Poor   Screenings: GAD-7   Flowsheet Row Office Visit from 02/14/2020 in [facility]  Total GAD-7 Score 17    PHQ2-9   Flowsheet Row Office Visit from 02/14/2020 in [facility] Office Visit from 10/19/2018 in  [facility] Office Visit from 08/30/2018 in [facility] Office Visit from 08/22/2018 in [facility] Office Visit from 07/17/2018 in [facility]  PHQ-2 Total Score 6 3 2 3 6   PHQ-9 Total Score 18 15 12 13 20        Assessment and Plan:  Auset Fritzler is a 25 y.o. 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   1. GAD (generalized anxiety disorder) 2. Mild episode of recurrent major depressive disorder (HCC) She reports slight worsening in anxiety and depressive symptoms in the context of ongoing pelvic pain, which she attributes to endometriosis.  Other psychosocial stressors includes medical condition of myalgia, seizure-like episode.  Although it is preferable to uptitrate the dose of venlafaxine, she would like to continue at the same dose.  Will switch from Xanax to lorazepam to optimize treatment for anxiety; discussed that this medication will be tapered off in the future to avoid long-term risk of dependence and oversedation.  Will continue venlafaxine to target depression and anxiety.  Discussed potential risk of headache.  Will continue rexulti as adjunctive treatment for depression.  Discussed potential metabolic side effect and EPS.  Will continue lamotrigine for mood dysregulation.  Noted that this medication used to be prescribed by neurology, and there was reportedly no indication procedure.  Although it is preferred to taper off this medication, we will continue at this time due to patient preference.  She is aware of its risk of Stevens-Johnson syndrome.  She will greatly benefit from CBT; will make referral again.   Plan I have reviewed and updated plans as below 1.Continue venlafaxine 75 mg daily 2.Continue Rexulti 0.5 mg daily 3.Continue lamotrigine 100 mg  twice a day 4. Start lorazepam 0.5 mg twice a day as needed for anxiety  5. Discontinue Xanax 5. Next appointment: 2/28 at 10:20PM for 30 mins, video - Referral for therapy   I have utilized the [place] Controlled Substances Reporting System (PMP AWAR[place]) to  confirm adherence regarding the patient's medication. My review reveals appropriate prescription fills.    Past trials of medication:sertraline,fluoxetine,Lexapro,duloxetine (headache, nightmares),elavil (hallucinations), Rexulti, Abilify, Xanax, lamotrigine, temazepam  The patient demonstrates the following risk factors for suicide: Chronic risk factors for suicide include:psychiatric disorder ofdepressionand chronic pain. Acute risk factorsfor suicide include: unemployment and social withdrawal/isolation. Protective factorsfor this patient include: positive social support, coping skills and hope for the future. Considering these factors, the overall suicide risk at this point appears to below. Patientisappropriate for outpatient follow up.  Samantha Hotter, MD 10/20/2020, 2:46 PM

## 2020-10-20 ENCOUNTER — Telehealth (INDEPENDENT_AMBULATORY_CARE_PROVIDER_SITE_OTHER): Payer: Medicare Other | Admitting: Psychiatry

## 2020-10-20 ENCOUNTER — Encounter: Payer: Self-pay | Admitting: Psychiatry

## 2020-10-20 ENCOUNTER — Other Ambulatory Visit: Payer: Self-pay

## 2020-10-20 DIAGNOSIS — F330 Major depressive disorder, recurrent, mild: Secondary | ICD-10-CM

## 2020-10-20 DIAGNOSIS — F411 Generalized anxiety disorder: Secondary | ICD-10-CM | POA: Diagnosis not present

## 2020-10-20 MED ORDER — VENLAFAXINE HCL ER 75 MG PO CP24
75.0000 mg | ORAL_CAPSULE | Freq: Every day | ORAL | 1 refills | Status: DC
Start: 2020-10-20 — End: 2020-12-09

## 2020-10-20 MED ORDER — LORAZEPAM 0.5 MG PO TABS: 0.5000 mg | ORAL_TABLET | Freq: Two times a day (BID) | ORAL | 1 refills | Status: DC | PRN

## 2020-10-20 NOTE — Patient Instructions (Signed)
1.Continue venlafaxine 75 mg daily 2.Continue Rexulti 0.5 mg daily 3.Continue lamotrigine 100 mg twice a day 4. Start lorazepam 0.5 mg twice a day as needed for anxiety  5. Discontinue Xanax 5. Next appointment: 2/28 at 10:20

## 2020-11-11 NOTE — Progress Notes (Deleted)
[facility] MD/PA/NP OP Progress Note  11/11/2020 1:29 PM Genevie Elman  MRN:  [id number]  Chief Complaint:  HPI: *** Visit Diagnosis: No diagnosis found.  Past Psychiatric History: Please see initial evaluation for full details. I have reviewed the history. No updates at this time.    Past Medical History:  Past Medical History:  Diagnosis Date  . Anti-phospholipid antibody syndrome (HCC)   . Anxiety   . Asthma   . Bipolar 2 disorder (HCC)   . Conversion disorder   . Depression   . DVT (deep venous thrombosis) (HCC)    LLE  . Fibromyalgia   . Gallbladder polyp   . H. pylori infection   . IBS (irritable bowel syndrome)   . Legal blindness    Right eye  . Migraine   . Mood disorder (HCC)   . Psychogenic nonepileptic seizure   . Urinary tract infection     Past Surgical History:  Procedure Laterality Date  . COLONOSCOPY WITH ESOPHAGOGASTRODUODENOSCOPY (EGD)    . HYMENECTOMY  2016    Family Psychiatric History: Please see initial evaluation for full details. I have reviewed the history. No updates at this time.     Family History:  Family History  Problem Relation Age of Onset  . Anxiety disorder Mother   . Depression Mother   . Hyperlipidemia Mother   . Bipolar disorder Mother   . Thyroid disease Maternal Grandmother   . Deep vein thrombosis Maternal Grandmother   . Thyroid disease Maternal Grandfather   . Anxiety disorder Father   . Depression Father   . Autism Sister   . Other Sister        [place] syndrome  . Factor V Leiden deficiency Maternal Aunt   . Breast cancer Maternal Aunt   . Bipolar disorder Maternal Aunt   . Diabetes Paternal Grandfather   . Deep vein thrombosis Maternal Aunt   . Bipolar disorder Maternal Aunt   . Colon cancer Neg Hx   . Esophageal cancer Neg Hx   . Rectal cancer Neg Hx     Social History:  Social History   Socioeconomic History  . Marital status: Single    Spouse name: Not on file  . Number of children: 0  . Years of  education: Not on file  . Highest education level: Not on file  Occupational History  . Occupation: Consulting civil engineer  Tobacco Use  . Smoking status: Never Smoker  . Smokeless tobacco: Never Used  Vaping Use  . Vaping Use: Never used  Substance and Sexual Activity  . Alcohol use: Never  . Drug use: Never  . Sexual activity: Yes    Birth control/protection: None, Condom  Other Topics Concern  . Not on file  Social History Narrative   From [place].  Was receiving medical care at [facility].  Resides with mother.   Social Determinants of Health   Financial Resource Strain: Not on file  Food Insecurity: Not on file  Transportation Needs: Not on file  Physical Activity: Not on file  Stress: Not on file  Social Connections: Not on file    Allergies:  Allergies  Allergen Reactions  . Sulfa Antibiotics Anaphylaxis  . Amitriptyline     Hallucinations    Metabolic Disorder Labs: Lab Results  Component Value Date   HGBA1C 5.2 08/30/2018   No results found for: PROLACTIN Lab Results  Component Value Date   CHOL 171 04/14/2018   TRIG 140 04/14/2018   HDL 50 04/14/2018  CHOLHDL 3.4 04/14/2018   LDLCALC 93 04/14/2018   Lab Results  Component Value Date   TSH 0.737 08/30/2018    Therapeutic Level Labs: No results found for: LITHIUM No results found for: VALPROATE No components found for:  CBMZ  Current Medications: Current Outpatient Medications  Medication Sig Dispense Refill  . Brexpiprazole (REXULTI) 0.5 MG TABS Take 1 tablet (0.5 mg total) by mouth every evening. 90 tablet 0  . lamoTRIgine (LAMICTAL) 100 MG tablet Take 1 tablet (100 mg total) by mouth 2 (two) times daily. 180 tablet 0  . LORazepam (ATIVAN) 0.5 MG tablet Take 1 tablet (0.5 mg total) by mouth 2 (two) times daily as needed for anxiety. 60 tablet 1  . venlafaxine XR (EFFEXOR-XR) 75 MG 24 hr capsule Take 1 capsule (75 mg total) by mouth daily with breakfast. 30 capsule 1   No current facility-administered  medications for this visit.     Musculoskeletal: Strength & Muscle Tone: N/A Gait & Station: N/A Patient leans: N/A  Psychiatric Specialty Exam: Review of Systems  There were no vitals taken for this visit.There is no height or weight on file to calculate BMI.  General Appearance: {Appearance:22683}  Eye Contact:  {BHH EYE CONTACT:22684}  Speech:  Clear and Coherent  Volume:  Normal  Mood:  {BHH MOOD:22306}  Affect:  {Affect (PAA):22687}  Thought Process:  Coherent  Orientation:  Full (Time, Place, and Person)  Thought Content: Logical   Suicidal Thoughts:  {ST/HT (PAA):22692}  Homicidal Thoughts:  {ST/HT (PAA):22692}  Memory:  Immediate;   Good  Judgement:  {Judgement (PAA):22694}  Insight:  {Insight (PAA):22695}  Psychomotor Activity:  Normal  Concentration:  Concentration: Good and Attention Span: Good  Recall:  Good  Fund of Knowledge: Good  Language: Good  Akathisia:  No  Handed:  Right  AIMS (if indicated): not done  Assets:  Communication Skills Desire for Improvement  ADL's:  Intact  Cognition: WNL  Sleep:  {BHH GOOD/FAIR/POOR:22877}   Screenings: GAD-7   Flowsheet Row Office Visit from 02/14/2020 in [facility]  Total GAD-7 Score 17    PHQ2-9   Flowsheet Row Office Visit from 02/14/2020 in [facility] Office Visit from 10/19/2018 in [facility] Office Visit from 08/30/2018 in [facility] Office Visit from 08/22/2018 in [facility] Office Visit from 07/17/2018 in [facility]  PHQ-2 Total Score 6 3 2 3 6   PHQ-9 Total Score 18 15 12 13 20       Assessment and Plan:  is a 25 y.o. year old female with a history of  depression, non epileptic seizure, hypercoagulable state, Lupus anticoagulant disorder with history of DVT, who presents for follow up appointment for below.   1. GAD (generalized anxiety disorder)  2. Mild episode of  recurrent major depressive disorder (HCC)    She reports slight worsening in anxiety and depressive symptoms in the context of ongoing pelvic pain, which she attributes to endometriosis. Other psychosocial stressors includes medical condition of myalgia, seizure-like episode. Although it is preferable to uptitrate the dose of venlafaxine, she would like to continue at the same dose. Will switch from Xanax to lorazepam to optimize treatment for anxiety; discussed that this medication will be tapered off in the future to avoid long-term risk of dependence and oversedation. Will continue venlafaxine to target depression and anxiety. Discussed potential risk of headache. Will continue rexulti as adjunctive treatment for depression. Discussed potential metabolic side  effect and EPS. Will continue lamotrigine for mood dysregulation. Noted that this medication used to be prescribed by neurology, and there was reportedly no indication procedure. Although it is preferred to taper off this medication, we will continue at this time due to patient preference. She is aware of its risk of Stevens-Johnson syndrome. She will greatly benefit from CBT; will make referral again.  Plan   1. Continue venlafaxine 75 mg daily  2. Continue Rexulti 0.5 mg daily  3. Continue lamotrigine 100 mg twice a day  4. Start lorazepam 0.5 mg twice a day as needed for anxiety  5. Discontinue Xanax  5. Next appointment: 2/28 at 10:20 PM for 30 mins, video  - Referral for therapy    Past trials of medication: sertraline, fluoxetine, Lexapro, duloxetine (headache, nightmares), elavil (hallucinations), Rexulti, Abilify, Xanax, lamotrigine, temazepam   The patient demonstrates the following risk factors for suicide: Chronic risk factors for suicide include: psychiatric disorder of depression and chronic pain. Acute risk factors for suicide include: unemployment and social withdrawal/isolation. Protective factors for this patient include:  positive social support, coping skills and hope for the future. Considering these factors, the overall suicide risk at this point appears to be low. Patient is appropriate for outpatient follow up.   Neysa Hotter, MD 11/11/2020, 1:29 PM

## 2020-11-12 ENCOUNTER — Telehealth: Payer: Self-pay | Admitting: Psychiatry

## 2020-11-17 ENCOUNTER — Telehealth: Payer: Self-pay | Admitting: Psychiatry

## 2020-11-17 ENCOUNTER — Other Ambulatory Visit: Payer: Self-pay

## 2020-11-17 ENCOUNTER — Telehealth: Payer: Medicare Other | Admitting: Psychiatry

## 2020-12-09 ENCOUNTER — Other Ambulatory Visit: Payer: Self-pay | Admitting: Psychiatry

## 2020-12-09 ENCOUNTER — Telehealth: Payer: Self-pay

## 2020-12-09 MED ORDER — VENLAFAXINE HCL ER 75 MG PO CP24: 75.0000 mg | ORAL_CAPSULE | Freq: Every day | ORAL | 0 refills | Status: DC

## 2020-12-09 NOTE — Telephone Encounter (Signed)
Ordered

## 2020-12-09 NOTE — Telephone Encounter (Signed)
received fax requesting a 90 day supply of the velafaxine hcl 75mg 

## 2020-12-12 ENCOUNTER — Other Ambulatory Visit (HOSPITAL_COMMUNITY): Payer: Self-pay | Admitting: Psychiatry

## 2020-12-17 NOTE — Progress Notes (Addendum)
Virtual Visit via Video Note  I connected with Samantha Dougherty on 12/22/20 at  4:00 PM EDT by a video enabled telemedicine application and verified that I am speaking with the correct person using two identifiers.  Location: Patient: home Provider: office Persons participated in the visit- patient, provider   I discussed the limitations of evaluation and management by telemedicine and the availability of in person appointments. The patient expressed understanding and agreed to proceed.     I discussed the assessment and treatment plan with the patient. The patient was provided an opportunity to ask questions and all were answered. The patient agreed with the plan and demonstrated an understanding of the instructions.   The patient was advised to call back or seek an in-person evaluation if the symptoms worsen or if the condition fails to improve as anticipated.  I provided 15 minutes of non-face-to-face time during this encounter.   Neysa Hotter, MD    [facility] MD/PA/NP OP Progress Note  12/22/2020 4:33 PM Samantha Dougherty  MRN:  [id number]  Chief Complaint:  Chief Complaint    Follow-up; Depression; Anxiety     HPI:  This is a follow-up appointment for depression and anxiety.  She states that lorazepam has been huge help to her.  She has been able to running more errands, and visiting people with her family.  Although she does basketball once a week, she wants to do it more.  She has been more social with her parents.  She does not want to uptitrate venlafaxine.  She is concerned of being on many medication while she is young.  When she was advised to taper down lamotrigine, she declined this as she is concerned of having non epileptic seizure.  She wants to stay on the medication as it is now, and would like to think about these options at this time until the next visit.  She has depressive symptoms as in PHQ-9.  She denies SI. She feels anxious and tense at times. She reports decrease in  appetite due to abdominal pain; she is scheduled to have [place] in a few weeks.   Daily routine:plays basket ball at times when she does not have pain Employment:unemployed Household:parents, her 59 year old sister Marital status:single    Visit Diagnosis:    ICD-10-CM   1. GAD (generalized anxiety disorder)  F41.1   2. MDD (major depressive disorder), recurrent episode, mild (HCC)  F33.0     Past Psychiatric History: Please see initial evaluation for full details. I have reviewed the history. No updates at this time.     Past Medical History:  Past Medical History:  Diagnosis Date  . Anti-phospholipid antibody syndrome (HCC)   . Anxiety   . Asthma   . Bipolar 2 disorder (HCC)   . Conversion disorder   . Depression   . DVT (deep venous thrombosis) (HCC)    LLE  . Fibromyalgia   . Gallbladder polyp   . H. pylori infection   . IBS (irritable bowel syndrome)   . Legal blindness    Right eye  . Migraine   . Mood disorder (HCC)   . Psychogenic nonepileptic seizure   . Urinary tract infection     Past Surgical History:  Procedure Laterality Date  . COLONOSCOPY WITH ESOPHAGOGASTRODUODENOSCOPY (EGD)    . HYMENECTOMY  2016    Family Psychiatric History: Please see initial evaluation for full details. I have reviewed the history. No updates at this time.  Family History:  Family History  Problem Relation Age of Onset  . Anxiety disorder Mother   . Depression Mother   . Hyperlipidemia Mother   . Bipolar disorder Mother   . Thyroid disease Maternal Grandmother   . Deep vein thrombosis Maternal Grandmother   . Thyroid disease Maternal Grandfather   . Anxiety disorder Father   . Depression Father   . Autism Sister   . Other Sister        [place] syndrome  . Factor V Leiden deficiency Maternal Aunt   . Breast cancer Maternal Aunt   . Bipolar disorder Maternal Aunt   . Diabetes Paternal Grandfather   . Deep vein thrombosis Maternal Aunt   . Bipolar disorder  Maternal Aunt   . Colon cancer Neg Hx   . Esophageal cancer Neg Hx   . Rectal cancer Neg Hx     Social History:  Social History   Socioeconomic History  . Marital status: Single    Spouse name: Not on file  . Number of children: 0  . Years of education: Not on file  . Highest education level: Not on file  Occupational History  . Occupation: Consulting civil engineerstudent  Tobacco Use  . Smoking status: Never Smoker  . Smokeless tobacco: Never Used  Vaping Use  . Vaping Use: Never used  Substance and Sexual Activity  . Alcohol use: Never  . Drug use: Never  . Sexual activity: Yes    Birth control/protection: None, Condom  Other Topics Concern  . Not on file  Social History Narrative   From [place].  Was receiving medical care at [facility].  Resides with mother.   Social Determinants of Health   Financial Resource Strain: Not on file  Food Insecurity: Not on file  Transportation Needs: Not on file  Physical Activity: Not on file  Stress: Not on file  Social Connections: Not on file    Allergies:  Allergies  Allergen Reactions  . Sulfa Antibiotics Anaphylaxis  . Amitriptyline     Hallucinations    Metabolic Disorder Labs: Lab Results  Component Value Date   HGBA1C 5.2 08/30/2018   No results found for: PROLACTIN Lab Results  Component Value Date   CHOL 171 04/14/2018   TRIG 140 04/14/2018   HDL 50 04/14/2018   CHOLHDL 3.4 04/14/2018   LDLCALC 93 04/14/2018   Lab Results  Component Value Date   TSH 0.737 08/30/2018    Therapeutic Level Labs: No results found for: LITHIUM No results found for: VALPROATE No components found for:  CBMZ  Current Medications: Current Outpatient Medications  Medication Sig Dispense Refill  . lamoTRIgine (LAMICTAL) 100 MG tablet Take 1 tablet (100 mg total) by mouth 2 (two) times daily. 180 tablet 0  . [START ON 01/16/2021] LORazepam (ATIVAN) 0.5 MG tablet Take 1 tablet (0.5 mg total) by mouth 2 (two) times daily as needed for anxiety. 60 tablet  2  . REXULTI 0.5 MG TABS TAKE 1 TABLET BY MOUTH EVERY EVENING. 30 tablet 2  . venlafaxine XR (EFFEXOR-XR) 75 MG 24 hr capsule Take 1 capsule (75 mg total) by mouth daily with breakfast. 90 capsule 0   No current facility-administered medications for this visit.     Musculoskeletal: Strength & Muscle Tone: N/A Gait & Station: N/A Patient leans: N/A  Psychiatric Specialty Exam: Review of Systems  Psychiatric/Behavioral: Positive for decreased concentration, dysphoric mood and sleep disturbance. Negative for agitation, behavioral problems, confusion, hallucinations, self-injury and suicidal ideas. The patient is  nervous/anxious. The patient is not hyperactive.   All other systems reviewed and are negative.   There were no vitals taken for this visit.There is no height or weight on file to calculate BMI.  General Appearance: Fairly Groomed  Eye Contact:  Good  Speech:  Clear and Coherent  Volume:  Normal  Mood:  better  Affect:  Appropriate, Congruent and slight fatigue  Thought Process:  Coherent  Orientation:  Full (Time, Place, and Person)  Thought Content: Logical   Suicidal Thoughts:  No  Homicidal Thoughts:  No  Memory:  Immediate;   Good  Judgement:  Good  Insight:  Fair  Psychomotor Activity:  Normal  Concentration:  Concentration: Good and Attention Span: Good  Recall:  Good  Fund of Knowledge: Good  Language: Good  Akathisia:  No  Handed:  Right  AIMS (if indicated): not done  Assets:  Communication Skills Desire for Improvement  ADL's:  Intact  Cognition: WNL  Sleep:  Fair   Screenings: GAD-7   Flowsheet Row Office Visit from 02/14/2020 in [facility]  Total GAD-7 Score 17    PHQ2-9   Flowsheet Row Video Visit from 12/22/2020 in [facility] Office Visit from 02/14/2020 in [facility] Office Visit from 10/19/2018 in [facility] Office Visit from 08/30/2018 in [facility] Visit from 08/22/2018 in [facility]  PHQ-2 Total Score 4 6 3 2 3   PHQ-9 Total Score 16 18 15 12 13        Assessment and Plan:  Sharnay Cashion is a 25 y.o. 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    1. GAD (generalized anxiety disorder) 2. MDD (major depressive disorder), recurrent episode, mild (HCC) She reports improvement in and anxiety since switching from Xanax to lorazepam.  Psychosocial stressors includes pain, which she attributes to endometriosis, medical condition of myalgia,seizure-like episode.  Although it has been discussed at least a few times to uptitrate the dose of venlafaxine to optimize treatment for depression and anxiety, she declined this.  She also declined to taper down lamotrigine with concern of nonepileptic seizure.  Noted that this medication used to be prescribed by neurology, and there was reportedly no indication for seizure.  She declines to adjust any medication at this time.  We will continue current medication regimen; will continue venlafaxine to target depression and anxiety.  We will continue rexulti adjunctive treatment for depression.  Will continue lamotrigine for mood dysregulation.  Will continue lorazepam as needed for anxiety.  Although she agreed for in person visit, the schedule does not match; will do video visit at the next encounter.   Plan I have reviewed and updated plans as below 1.Continue venlafaxine 75 mg daily 2.Continue Rexulti 0.5 mg daily 3.Continue lamotrigine 100 mg twice a day 4. Continue lorazepam 0.5 mg twice a day as needed for anxiety  5. Next appointment:6/21 at 11 AM for 30 mins, video (she requested 3 months visit instead of sooner visit) - Referral for therapy - on emgality monthly   This clinician has discussed the  side effect associated with medication prescribed during this encounter. Please refer to notes in the previous encounters for more details.   Past trials of medication:sertraline,fluoxetine,Lexapro,duloxetine (headache, nightmares),elavil (hallucinations), Rexulti, Abilify, Xanax, lamotrigine, temazepam  The patient demonstrates the following risk factors for suicide: Chronic risk factors  for suicide include:psychiatric disorder ofdepressionand chronic pain. Acute risk factorsfor suicide include: unemployment and social withdrawal/isolation. Protective factorsfor this patient include: positive social support, coping skills and hope for the future. Considering these factors, the overall suicide risk at this point appears to below. Patientisappropriate for outpatient follow up.   Neysa Hotter, MD 12/22/2020, 4:33 PM

## 2020-12-22 ENCOUNTER — Other Ambulatory Visit: Payer: Self-pay

## 2020-12-22 ENCOUNTER — Encounter: Payer: Self-pay | Admitting: Psychiatry

## 2020-12-22 ENCOUNTER — Telehealth (INDEPENDENT_AMBULATORY_CARE_PROVIDER_SITE_OTHER): Payer: Medicare Other | Admitting: Psychiatry

## 2020-12-22 DIAGNOSIS — F330 Major depressive disorder, recurrent, mild: Secondary | ICD-10-CM

## 2020-12-22 DIAGNOSIS — F411 Generalized anxiety disorder: Secondary | ICD-10-CM

## 2020-12-22 MED ORDER — LAMOTRIGINE 100 MG PO TABS
100.0000 mg | ORAL_TABLET | Freq: Two times a day (BID) | ORAL | 0 refills | Status: DC
Start: 2020-12-22 — End: 2021-03-10

## 2020-12-22 MED ORDER — LORAZEPAM 0.5 MG PO TABS: 0.5000 mg | ORAL_TABLET | Freq: Two times a day (BID) | ORAL | 2 refills | Status: DC | PRN

## 2020-12-22 NOTE — Patient Instructions (Signed)
1.Continuevenlafaxine 75 mg daily 2.Continue Rexulti 0.5 mg daily 3.Continue lamotrigine 100 mg twice a day 4. Continue lorazepam 0.5 mg twice a day as needed for anxiety  5. Next appointment:6/21 at 11 AM

## 2020-12-29 ENCOUNTER — Ambulatory Visit: Payer: Medicare Other | Admitting: Family Medicine

## 2020-12-29 NOTE — Progress Notes (Deleted)
Samantha Dougherty is a 25 y.o. female presents to office today for annual physical exam examination.    Concerns today include: 1. ***  Occupation: ***, Marital status: ***, Substance use: *** Diet: ***, Exercise: *** Last eye exam: *** Last dental exam: *** Last colonoscopy: *** Last mammogram: *** Last pap smear: *** Refills needed today: *** Immunizations needed: Flu Vaccine: {YES/NO/WILD DPOEU:23536}  Tdap Vaccine: {YES/NO/WILD RWERX:54008}  - every 82yrs - (<3 lifetime doses or unknown): all wounds -- look up need for Tetanus IG - (>=3 lifetime doses): clean/minor wound if >70yrs from previous; all other wounds if >48yrs from previous Zoster Vaccine: {YES/NO/WILD CARDS:18581} (those >50yo, once) Pneumonia Vaccine: {YES/NO/WILD QPYPP:50932} (those w/ risk factors) - (<1yr) Both: Immunocompromised, cochlear implant, CSF leak, asplenic, sickle cell, Chronic Renal Failure - (<1yr) PPSV-23 only: Heart dz, lung disease, DM, tobacco abuse, alcoholism, cirrhosis/liver disease. - (>45yr): PPSV13 then PPSV23 in 6-12mths;  - (>90yr): repeat PPSV23 once if pt received prior to 24yo and 4yrs have passed  Past Medical History:  Diagnosis Date  . Anti-phospholipid antibody syndrome (HCC)   . Anxiety   . Asthma   . Bipolar 2 disorder (HCC)   . Conversion disorder   . Depression   . DVT (deep venous thrombosis) (HCC)    LLE  . Fibromyalgia   . Gallbladder polyp   . H. pylori infection   . IBS (irritable bowel syndrome)   . Legal blindness    Right eye  . Migraine   . Mood disorder (HCC)   . Psychogenic nonepileptic seizure   . Urinary tract infection    Social History   Socioeconomic History  . Marital status: Single    Spouse name: Not on file  . Number of children: 0  . Years of education: Not on file  . Highest education level: Not on file  Occupational History  . Occupation: Consulting civil engineer  Tobacco Use  . Smoking status: Never Smoker  . Smokeless tobacco: Never Used   Vaping Use  . Vaping Use: Never used  Substance and Sexual Activity  . Alcohol use: Never  . Drug use: Never  . Sexual activity: Yes    Birth control/protection: None, Condom  Other Topics Concern  . Not on file  Social History Narrative   From [place].  Was receiving medical care at [facility].  Resides with mother.   Social Determinants of Health   Financial Resource Strain: Not on file  Food Insecurity: Not on file  Transportation Needs: Not on file  Physical Activity: Not on file  Stress: Not on file  Social Connections: Not on file  Intimate Partner Violence: Not on file   Past Surgical History:  Procedure Laterality Date  . COLONOSCOPY WITH ESOPHAGOGASTRODUODENOSCOPY (EGD)    . HYMENECTOMY  2016   Family History  Problem Relation Age of Onset  . Anxiety disorder Mother   . Depression Mother   . Hyperlipidemia Mother   . Bipolar disorder Mother   . Thyroid disease Maternal Grandmother   . Deep vein thrombosis Maternal Grandmother   . Thyroid disease Maternal Grandfather   . Anxiety disorder Father   . Depression Father   . Autism Sister   . Other Sister        [place] syndrome  . Factor V Leiden deficiency Maternal Aunt   . Breast cancer Maternal Aunt   . Bipolar disorder Maternal Aunt   . Diabetes Paternal Grandfather   . Deep vein thrombosis Maternal Aunt   .  Bipolar disorder Maternal Aunt   . Colon cancer Neg Hx   . Esophageal cancer Neg Hx   . Rectal cancer Neg Hx     Current Outpatient Medications:  .  lamoTRIgine (LAMICTAL) 100 MG tablet, Take 1 tablet (100 mg total) by mouth 2 (two) times daily., Disp: 180 tablet, Rfl: 0 .  [START ON 01/16/2021] LORazepam (ATIVAN) 0.5 MG tablet, Take 1 tablet (0.5 mg total) by mouth 2 (two) times daily as needed for anxiety., Disp: 60 tablet, Rfl: 2 .  REXULTI 0.5 MG TABS, TAKE 1 TABLET BY MOUTH EVERY EVENING., Disp: 30 tablet, Rfl: 2 .  venlafaxine XR (EFFEXOR-XR) 75 MG 24 hr capsule, Take 1 capsule (75 mg total) by  mouth daily with breakfast., Disp: 90 capsule, Rfl: 0  Allergies  Allergen Reactions  . Sulfa Antibiotics Anaphylaxis  . Amitriptyline     Hallucinations     ROS: Review of Systems {ros; complete:30496}    Physical exam {Exam, [phone]}    Assessment/ Plan: Samantha Dougherty here for annual physical exam.   No problem-specific Assessment & Plan notes found for this encounter.   Counseled on healthy lifestyle choices, including diet (rich in fruits, vegetables and lean meats and low in salt and simple carbohydrates) and exercise (at least 30 minutes of moderate physical activity daily).  Patient to follow up in 1 year for annual exam or sooner if needed.  Franco Duley M. Nadine Counts, DO

## 2021-01-01 ENCOUNTER — Encounter: Payer: Self-pay | Admitting: Family Medicine

## 2021-01-16 ENCOUNTER — Ambulatory Visit (INDEPENDENT_AMBULATORY_CARE_PROVIDER_SITE_OTHER): Payer: Medicare Other | Admitting: Nurse Practitioner

## 2021-01-16 ENCOUNTER — Other Ambulatory Visit: Payer: Self-pay

## 2021-01-16 ENCOUNTER — Encounter: Payer: Self-pay | Admitting: Nurse Practitioner

## 2021-01-16 VITALS — BP 110/75 | HR 81 | Temp 98.7°F | Ht 63.0 in | Wt 127.0 lb

## 2021-01-16 DIAGNOSIS — R109 Unspecified abdominal pain: Secondary | ICD-10-CM

## 2021-01-16 DIAGNOSIS — R300 Dysuria: Secondary | ICD-10-CM

## 2021-01-16 LAB — MICROSCOPIC EXAMINATION
Bacteria, UA: NONE SEEN
RBC, Urine: NONE SEEN /hpf (ref 0–2)
WBC, UA: NONE SEEN /hpf (ref 0–5)

## 2021-01-16 LAB — URINALYSIS, ROUTINE W REFLEX MICROSCOPIC
Bilirubin, UA: NEGATIVE
Glucose, UA: NEGATIVE
Leukocytes,UA: NEGATIVE
Nitrite, UA: NEGATIVE
Protein,UA: NEGATIVE
RBC, UA: NEGATIVE
Specific Gravity, UA: 1.02 (ref 1.005–1.030)
Urobilinogen, Ur: 1 mg/dL (ref 0.2–1.0)
pH, UA: 7.5 (ref 5.0–7.5)

## 2021-01-16 MED ORDER — CEFTRIAXONE SODIUM 1 G IJ SOLR
1.0000 g | Freq: Once | INTRAMUSCULAR | Status: AC
Start: 2021-01-16 — End: 2021-01-16
  Administered 2021-01-16: 1 g via INTRAMUSCULAR

## 2021-01-16 MED ORDER — CEPHALEXIN 500 MG PO CAPS: 500.0000 mg | ORAL_CAPSULE | Freq: Two times a day (BID) | ORAL | 0 refills | Status: DC

## 2021-01-16 NOTE — Patient Instructions (Signed)
Pyelonephritis, Adult    Pyelonephritis is an infection that occurs in the kidney. The kidneys are organs that help clean the blood by moving waste out of the blood and into the pee (urine). This infection can happen quickly, or it can last for a long time. In most cases, it clears up with treatment and does not cause other problems.  What are the causes?  This condition may be caused by:  · Germs (bacteria) going from the bladder up to the kidney. This may happen after having a bladder infection.  · Germs going from the blood to the kidney.  What increases the risk?  This condition is more likely to develop in:  · Pregnant women.  · Older people.  · People who have any of these conditions:  ? Diabetes.  ? Inflammation of the prostate gland (prostatitis), in males.  ? Kidney stones or bladder stones.  ? Other problems with the kidney or the parts of your body that carry pee from the kidneys to the bladder (ureters).  ? Cancer.  · People who have a small, thin tube (catheter) placed in the bladder.  · People who are sexually active.  · Women who use a medicine that kills sperm (spermicide) to prevent pregnancy.  · People who have had a prior urinary tract infection (UTI).  What are the signs or symptoms?  Symptoms of this condition include:  · Peeing often.  · A strong urge to pee right away.  · Burning or stinging when peeing.  · Belly pain.  · Back pain.  · Pain in the side (flank area).  · Fever or chills.  · Blood in the pee, or dark pee.  · Feeling sick to your stomach (nauseous) or throwing up (vomiting).  How is this treated?  This condition may be treated by:  · Taking antibiotic medicines by mouth (orally).  · Drinking enough fluids.  If the infection is bad, you may need to stay in the hospital. You may be given antibiotics and fluids that are put directly into a vein through an IV tube.  In some cases, other treatments may be needed.  Follow these instructions at home:  Medicines  · Take your antibiotic  medicine as told by your doctor. Do not stop taking the antibiotic even if you start to feel better.  · Take over-the-counter and prescription medicines only as told by your doctor.  General instructions    · Drink enough fluid to keep your pee pale yellow.  · Avoid caffeine, tea, and carbonated drinks.  · Pee (urinate) often. Avoid holding in pee for long periods of time.  · Pee before and after sex.  · After pooping (having a bowel movement), women should wipe from front to back. Use each tissue only once.  · Keep all follow-up visits as told by your doctor. This is important.  Contact a doctor if:  · You do not feel better after 2 days.  · Your symptoms get worse.  · You have a fever.  Get help right away if:  · You cannot take your medicine or drink fluids as told.  · You have chills and shaking.  · You throw up.  · You have very bad pain in your side or back.  · You feel very weak or you pass out (faint).  Summary  · Pyelonephritis is an infection that occurs in the kidney.  · In most cases, this infection clears up with treatment and does   not cause other problems.  · Take your antibiotic medicine as told by your doctor. Do not stop taking the antibiotic even if you start to feel better.  · Drink enough fluid to keep your pee pale yellow.  This information is not intended to replace advice given to you by your health care provider. Make sure you discuss any questions you have with your health care provider.  Document Revised: 07/11/2018 Document Reviewed: 07/11/2018  Elsevier Patient Education © 2021 [employer]

## 2021-01-16 NOTE — Progress Notes (Signed)
Acute Office Visit  Subjective:    Patient ID: Samantha Dougherty, female    DOB: 25-May-1996, 25 y.o.   MRN: [id number]  Chief Complaint  Patient presents with  . Urinary Tract Infection    Urinary Tract Infection  This is a new problem. Episode onset: For 2 weeks. The problem occurs every urination. The problem has been gradually worsening. The quality of the pain is described as aching and burning. The pain is moderate. There has been no fever. She is not sexually active. There is no history of pyelonephritis. Associated symptoms include chills and nausea. Pertinent negatives include no vomiting. She has tried nothing for the symptoms.     Past Medical History:  Diagnosis Date  . Anti-phospholipid antibody syndrome (HCC)   . Anxiety   . Asthma   . Bipolar 2 disorder (HCC)   . Conversion disorder   . Depression   . DVT (deep venous thrombosis) (HCC)    LLE  . Fibromyalgia   . Gallbladder polyp   . H. pylori infection   . IBS (irritable bowel syndrome)   . Legal blindness    Right eye  . Migraine   . Mood disorder (HCC)   . Psychogenic nonepileptic seizure   . Urinary tract infection     Past Surgical History:  Procedure Laterality Date  . COLONOSCOPY WITH ESOPHAGOGASTRODUODENOSCOPY (EGD)    . HYMENECTOMY  2016    Family History  Problem Relation Age of Onset  . Anxiety disorder Mother   . Depression Mother   . Hyperlipidemia Mother   . Bipolar disorder Mother   . Thyroid disease Maternal Grandmother   . Deep vein thrombosis Maternal Grandmother   . Thyroid disease Maternal Grandfather   . Anxiety disorder Father   . Depression Father   . Autism Sister   . Other Sister        [place] syndrome  . Factor V Leiden deficiency Maternal Aunt   . Breast cancer Maternal Aunt   . Bipolar disorder Maternal Aunt   . Diabetes Paternal Grandfather   . Deep vein thrombosis Maternal Aunt   . Bipolar disorder Maternal Aunt   . Colon cancer Neg Hx   . Esophageal cancer Neg  Hx   . Rectal cancer Neg Hx     Social History   Socioeconomic History  . Marital status: Single    Spouse name: Not on file  . Number of children: 0  . Years of education: Not on file  . Highest education level: Not on file  Occupational History  . Occupation: Consulting civil engineer  Tobacco Use  . Smoking status: Never Smoker  . Smokeless tobacco: Never Used  Vaping Use  . Vaping Use: Never used  Substance and Sexual Activity  . Alcohol use: Never  . Drug use: Never  . Sexual activity: Yes    Birth control/protection: None, Condom  Other Topics Concern  . Not on file  Social History Narrative   From [place].  Was receiving medical care at [facility]  Resides with mother.   Social Determinants of Health   Financial Resource Strain: Not on file  Food Insecurity: Not on file  Transportation Needs: Not on file  Physical Activity: Not on file  Stress: Not on file  Social Connections: Not on file  Intimate Partner Violence: Not on file    Outpatient Medications Prior to Visit  Medication Sig Dispense Refill  . lamoTRIgine (LAMICTAL) 100 MG tablet Take 1 tablet (100 mg total)  by mouth 2 (two) times daily. 180 tablet 0  . LORazepam (ATIVAN) 0.5 MG tablet Take 1 tablet (0.5 mg total) by mouth 2 (two) times daily as needed for anxiety. 60 tablet 2  . REXULTI 0.5 MG TABS TAKE 1 TABLET BY MOUTH EVERY EVENING. 30 tablet 2  . venlafaxine XR (EFFEXOR-XR) 75 MG 24 hr capsule Take 1 capsule (75 mg total) by mouth daily with breakfast. 90 capsule 0   No facility-administered medications prior to visit.    Allergies  Allergen Reactions  . Sulfa Antibiotics Anaphylaxis  . Amitriptyline     Hallucinations    Review of Systems  Constitutional: Positive for chills. Negative for fever.  HENT: Negative.   Respiratory: Negative.   Gastrointestinal: Positive for nausea. Negative for abdominal pain and vomiting.  Musculoskeletal: Negative.   Skin: Negative for rash.  All other systems reviewed and  are negative.      Objective:    Physical Exam Vitals and nursing note reviewed.  Constitutional:      Appearance: Normal appearance.  HENT:     Head: Normocephalic.     Nose: Nose normal.  Eyes:     Conjunctiva/sclera: Conjunctivae normal.  Pulmonary:     Effort: Pulmonary effort is normal.     Breath sounds: Normal breath sounds.  Abdominal:     General: Bowel sounds are normal.     Tenderness: There is right CVA tenderness and left CVA tenderness.  Musculoskeletal:        General: Normal range of motion.  Skin:    Findings: No rash.  Neurological:     Mental Status: She is alert and oriented to person, place, and time.     BP 110/75   Pulse 81   Temp 98.7 F (37.1 C) (Temporal)   Ht 5\' 3"  (1.6 m)   Wt 127 lb (57.6 kg)   SpO2 97%   BMI 22.50 kg/m  Wt Readings from Last 3 Encounters:  01/16/21 127 lb (57.6 kg)  02/14/20 127 lb (57.6 kg)  12/13/18 146 lb (66.2 kg)    Health Maintenance Due  Topic Date Due  . Hepatitis C Screening  Never done  . HIV Screening  Never done  . TETANUS/TDAP  04/25/2019    There are no preventive care reminders to display for this patient.   Lab Results  Component Value Date   TSH 0.737 08/30/2018   Lab Results  Component Value Date   WBC 6.0 02/14/2020   HGB 14.3 02/14/2020   HCT 43.3 02/14/2020   MCV 92 02/14/2020   PLT 264 02/14/2020   Lab Results  Component Value Date   NA 141 02/14/2020   K 4.5 02/14/2020   CO2 28 02/14/2020   GLUCOSE 92 02/14/2020   BUN 7 02/14/2020   CREATININE 0.81 02/14/2020   BILITOT 0.4 02/14/2020   ALKPHOS 73 02/14/2020   AST 17 02/14/2020   ALT 11 02/14/2020   PROT 7.2 02/14/2020   ALBUMIN 4.9 02/14/2020   CALCIUM 10.3 (H) 02/14/2020   ANIONGAP 8 10/15/2018   Lab Results  Component Value Date   CHOL 171 04/14/2018   Lab Results  Component Value Date   HDL 50 04/14/2018   Lab Results  Component Value Date   LDLCALC 93 04/14/2018   Lab Results  Component Value Date    TRIG 140 04/14/2018   Lab Results  Component Value Date   CHOLHDL 3.4 04/14/2018   Lab Results  Component Value Date  HGBA1C 5.2 08/30/2018       Assessment & Plan:   Problem List Items Addressed This Visit      Other   Dysuria - Primary    Patient is reporting UTI symptoms in the last 2 weeks.  Painful urination with burning CVA tenderness, chills, nausea and lower abdominal pain.  Patient is not sexually active, patient has not used anything for symptoms.  Completed urinalysis which results pending.  Treating patient's symptoms with Rocephin 1 g in clinic.  Cultures ordered results pending  Will treat appropriately pending results      Relevant Medications   cephALEXin (KEFLEX) 500 MG capsule   Other Relevant Orders   Urinalysis, Routine w reflex microscopic (Completed)   Urine Culture    Other Visit Diagnoses    Flank pain       Relevant Medications   cefTRIAXone (ROCEPHIN) injection 1 g (Completed)       Meds ordered this encounter  Medications  . cefTRIAXone (ROCEPHIN) injection 1 g  . cephALEXin (KEFLEX) 500 MG capsule    Sig: Take 1 capsule (500 mg total) by mouth 2 (two) times daily.    Dispense:  14 capsule    Refill:  0    Order Specific Question:   Supervising Provider    Answer:   Raliegh Ip [[id number]]     Daryll Drown, NP

## 2021-01-16 NOTE — Assessment & Plan Note (Addendum)
Patient is reporting UTI symptoms in the last 2 weeks.  Painful urination with burning CVA tenderness, chills, nausea and lower abdominal pain.  Patient is not sexually active, patient has not used anything for symptoms.  Completed urinalysis which results pending.  Treating patient's symptoms with Rocephin 1 g in clinic.  Cultures ordered results pending  Will treat appropriately pending results

## 2021-01-19 LAB — URINE CULTURE

## 2021-01-26 ENCOUNTER — Encounter: Payer: Self-pay | Admitting: *Deleted

## 2021-01-30 ENCOUNTER — Encounter: Payer: Self-pay | Admitting: Family Medicine

## 2021-01-30 ENCOUNTER — Ambulatory Visit (INDEPENDENT_AMBULATORY_CARE_PROVIDER_SITE_OTHER): Payer: Medicare Other | Admitting: Family Medicine

## 2021-01-30 ENCOUNTER — Other Ambulatory Visit: Payer: Self-pay

## 2021-01-30 VITALS — BP 102/73 | HR 84 | Temp 97.8°F | Ht 63.0 in | Wt 127.6 lb

## 2021-01-30 DIAGNOSIS — D509 Iron deficiency anemia, unspecified: Secondary | ICD-10-CM | POA: Diagnosis not present

## 2021-01-30 DIAGNOSIS — M25562 Pain in left knee: Secondary | ICD-10-CM

## 2021-01-30 DIAGNOSIS — R809 Proteinuria, unspecified: Secondary | ICD-10-CM | POA: Diagnosis not present

## 2021-01-30 DIAGNOSIS — R8290 Unspecified abnormal findings in urine: Secondary | ICD-10-CM

## 2021-01-30 DIAGNOSIS — G8929 Other chronic pain: Secondary | ICD-10-CM

## 2021-01-30 DIAGNOSIS — M25561 Pain in right knee: Secondary | ICD-10-CM

## 2021-01-30 DIAGNOSIS — N62 Hypertrophy of breast: Secondary | ICD-10-CM | POA: Diagnosis not present

## 2021-01-30 LAB — URINALYSIS, COMPLETE
Bilirubin, UA: NEGATIVE
Glucose, UA: NEGATIVE
Ketones, UA: NEGATIVE
Leukocytes,UA: NEGATIVE
Nitrite, UA: NEGATIVE
Protein,UA: NEGATIVE
RBC, UA: NEGATIVE
Specific Gravity, UA: 1.025 (ref 1.005–1.030)
Urobilinogen, Ur: 0.2 mg/dL (ref 0.2–1.0)
pH, UA: 7 (ref 5.0–7.5)

## 2021-01-30 LAB — BAYER DCA HB A1C WAIVED: HB A1C (BAYER DCA - WAIVED): 4.8 % (ref <7.0)

## 2021-01-30 NOTE — Patient Instructions (Addendum)
You had labs performed today.  You will be contacted with the results of the labs once they are available, usually in the next 3 business days for routine lab work.  If you have an active my chart account, they will be released to your MyChart.  If you prefer to have these labs released to you via telephone, please let us know.  If you had a pap smear or biopsy performed, expect to be contacted in about 7-10 days.   Raynaud Phenomenon  Raynaud phenomenon is a condition that affects the blood vessels (arteries) that carry blood to your fingers and toes. The arteries that supply blood to your ears, lips, nipples, or the tip of your nose might also be affected. Raynaud phenomenon causes the arteries to become narrow temporarily (spasm). As a result, the flow of blood to the affected areas is temporarily decreased. This usually occurs in response to cold temperatures or stress. During an attack, the skin in the affected areas turns white, then blue, and finally red. You may also feel tingling or numbness in those areas. Attacks usually last for only a brief period, and then the blood flow to the area returns to normal. In most cases, Raynaud phenomenon does not cause serious health problems. What are the causes? In many cases, the cause of this condition is not known. The condition may occur on its own (primary Raynaud phenomenon) or may be associated with other diseases or factors (secondary Raynaud phenomenon). Possible causes may include:  Diseases or medical conditions that damage the arteries.  Injuries and repetitive actions that hurt the hands or feet.  Being exposed to certain chemicals.  Taking medicines that narrow the arteries.  Other medical conditions, such as lupus, scleroderma, rheumatoid arthritis, thyroid problems, blood disorders, Sjogren syndrome, or atherosclerosis. What increases the risk? The following factors may make you more likely to develop this condition:  Being 32-38  years old.  Being female.  Having a family history of Raynaud phenomenon.  Living in a cold climate.  Smoking. What are the signs or symptoms? Symptoms of this condition usually occur when you are exposed to cold temperatures or when you have emotional stress. The symptoms may last for a few minutes or up to several hours. They usually affect your fingers but may also affect your toes, nipples, lips, ears, or the tip of your nose. Symptoms may include:  Changes in skin color. The skin in the affected areas will turn pale or white. The skin may then change from white to bluish to red as normal blood flow returns to the area.  Numbness, tingling, or pain in the affected areas. In severe cases, symptoms may include:  Skin sores.  Tissues decaying and dying (gangrene). How is this diagnosed? This condition may be diagnosed based on:  Your symptoms and medical history.  A physical exam. During the exam, you may be asked to put your hands in cold water to check for a reaction to cold temperature.  Tests, such as: ? Blood tests to check for other diseases or conditions. ? A test to check the movement of blood through your arteries and veins (vascular ultrasound). ? A test in which the skin at the base of your fingernail is examined under a microscope (nailfold capillaroscopy). How is this treated? Treatment for this condition often involves making lifestyle changes and taking steps to control your exposure to cold temperatures. For more severe cases, medicine (calcium channel blockers) may be used to improve blood flow.  Surgery is sometimes done to block the nerves that control the affected arteries, but this is rare. Follow these instructions at home: Avoiding cold temperatures Take these steps to avoid exposure to cold:  If possible, stay indoors during cold weather.  When you go outside during cold weather, dress in layers and wear mittens, a hat, a scarf, and warm  footwear.  Wear mittens or gloves when handling ice or frozen food.  Use holders for glasses or cans containing cold drinks.  Let warm water run for a while before taking a shower or bath.  Warm up the car before driving in cold weather. Lifestyle  If possible, avoid stressful and emotional situations. Try to find ways to manage your stress, such as: ? Exercise. ? Yoga. ? Meditation. ? Biofeedback.  Do not use any products that contain nicotine or tobacco, such as cigarettes and e-cigarettes. If you need help quitting, ask your health care provider.  Avoid secondhand smoke.  Limit your use of caffeine. ? Switch to decaffeinated coffee, tea, and soda. ? Avoid chocolate.  Avoid vibrating tools and machinery. General instructions  Protect your hands and feet from injuries, cuts, or bruises.  Avoid wearing tight rings or wristbands.  Wear loose fitting socks and comfortable, roomy shoes.  Take over-the-counter and prescription medicines only as told by your health care provider. Contact a health care provider if:  Your discomfort becomes worse despite lifestyle changes.  You develop sores on your fingers or toes that do not heal.  Your fingers or toes turn black.  You have breaks in the skin on your fingers or toes.  You have a fever.  You have pain or swelling in your joints.  You have a rash.  Your symptoms occur on only one side of your body. Summary  Raynaud phenomenon is a condition that affects the arteries that carry blood to your fingers, toes, ears, lips, nipples, or the tip of your nose.  In many cases, the cause of this condition is not known.  Symptoms of this condition include changes in skin color, and numbness and tingling of the affected area.  Treatment for this condition includes lifestyle changes, reducing exposure to cold temperatures, and using medicines for severe cases of the condition.  Contact your health care provider if your  condition worsens despite treatment. This information is not intended to replace advice given to you by your health care provider. Make sure you discuss any questions you have with your health care provider. Document Revised: 01/17/2020 Document Reviewed: 01/17/2020 Elsevier Patient Education  2021 [employer].

## 2021-01-30 NOTE — Progress Notes (Signed)
Subjective: CC: Multiple concerns PCP: Janora Norlander, DO Samantha Dougherty is a 25 y.o. female presenting to clinic today for:  1.  Multiple concerns Prior to today's appointment mother brought a stack of previous labs and has highlighted graciously her concerns for me.  She was worried about possible Fanconi syndrome in this patient.  As the patient suffers from various abdominal pain, joint pains, etc.  She has had abnormal urinalyses in the past on different occasion showing sometimes protein in urine, sometimes ketonuria, sometimes evidence of infection.  She has been losing weight as of late due to intolerance to certain foods and drinks.  She describes abdominal pain that seems to be present with urination.  It sometimes wraps to her back and causes her to "want to pass out".  She will become nauseated during these episodes.  She continues to have periodic limb movement disorder that seems seizure-like in nature but has had totally negative work-up with neurology and epilepsy has been ruled out.  She has been diagnosed with endometriosis and is followed by OB/GYN.  She had a recent pelvic ultrasound to further evaluate possible colonic involvement.  Her mother worries that it is perhaps on her kidneys as well.  She has not yet mentioned this to the OB/GYN.  Patient continues to follow-up with Dr. Aviva Kluver for anxiety and depression.  Her Xanax was recently transitioned to Ativan, which she takes twice daily and her mother wants to know if she should take it more frequently as she "does not have a nighttime dose" and she feels that she needs it.  Patient is also asking for referral to breast surgeon for consideration of reduction of her breasts.  She reports upper back pain due to the heaviness from them.  Her sister and mother have suffered from similar and are under the care of Dr. Marla Roe for this.   ROS: Per HPI  Allergies  Allergen Reactions  . Sulfa Antibiotics  Anaphylaxis  . Amitriptyline     Hallucinations   Past Medical History:  Diagnosis Date  . Anti-phospholipid antibody syndrome (HCC)   . Anxiety   . Asthma   . Bipolar 2 disorder ([place])   . Conversion disorder   . Depression   . DVT (deep venous thrombosis) (HCC)    LLE  . Fibromyalgia   . Gallbladder polyp   . H. pylori infection   . IBS (irritable bowel syndrome)   . Legal blindness    Right eye  . Migraine   . Mood disorder ([place])   . Psychogenic nonepileptic seizure   . Urinary tract infection     Current Outpatient Medications:  .  lamoTRIgine (LAMICTAL) 100 MG tablet, Take 1 tablet (100 mg total) by mouth 2 (two) times daily., Disp: 180 tablet, Rfl: 0 .  LORazepam (ATIVAN) 0.5 MG tablet, Take 1 tablet (0.5 mg total) by mouth 2 (two) times daily as needed for anxiety., Disp: 60 tablet, Rfl: 2 .  REXULTI 0.5 MG TABS, TAKE 1 TABLET BY MOUTH EVERY EVENING., Disp: 30 tablet, Rfl: 2 .  venlafaxine XR (EFFEXOR-XR) 75 MG 24 hr capsule, Take 1 capsule (75 mg total) by mouth daily with breakfast., Disp: 90 capsule, Rfl: 0 Social History   Socioeconomic History  . Marital status: Single    Spouse name: Not on file  . Number of children: 0  . Years of education: Not on file  . Highest education level: Not on file  Occupational History  . Occupation:  student  Tobacco Use  . Smoking status: Never Smoker  . Smokeless tobacco: Never Used  Vaping Use  . Vaping Use: Never used  Substance and Sexual Activity  . Alcohol use: Never  . Drug use: Never  . Sexual activity: Yes    Birth control/protection: None, Condom  Other Topics Concern  . Not on file  Social History Narrative   From [place].  Was receiving medical care at [facility]  Resides with mother.   Social Determinants of Health   Financial Resource Strain: Not on file  Food Insecurity: Not on file  Transportation Needs: Not on file  Physical Activity: Not on file  Stress: Not on file  Social Connections: Not on file   Intimate Partner Violence: Not on file   Family History  Problem Relation Age of Onset  . Anxiety disorder Mother   . Depression Mother   . Hyperlipidemia Mother   . Bipolar disorder Mother   . Thyroid disease Maternal Grandmother   . Deep vein thrombosis Maternal Grandmother   . Thyroid disease Maternal Grandfather   . Anxiety disorder Father   . Depression Father   . Autism Sister   . Other Sister        [place] syndrome  . Factor V Leiden deficiency Maternal Aunt   . Breast cancer Maternal Aunt   . Bipolar disorder Maternal Aunt   . Diabetes Paternal Grandfather   . Deep vein thrombosis Maternal Aunt   . Bipolar disorder Maternal Aunt   . Colon cancer Neg Hx   . Esophageal cancer Neg Hx   . Rectal cancer Neg Hx     Objective: Office vital signs reviewed. BP 102/73   Pulse 84   Temp 97.8 F (36.6 C)   Ht $R'5\' 3"'vo$  (1.6 m)   Wt 127 lb 9.6 oz (57.9 kg)   LMP 01/21/2021   SpO2 96%   BMI 22.60 kg/m   Physical Examination:  General: Awake, alert, nontoxic, No acute distress HEENT: Normal; sclera white.  Moist mucous membranes Cardio: regular rate and rhythm, S1S2 heard, no murmurs appreciated Pulm: clear to auscultation bilaterally, no wheezes, rhonchi or rales; normal work of breathing on room air GU: No CVA tenderness to palpation GI: Mild right lower quadrant tenderness.  No rebound.  No guarding.  Bowel sounds are present. GU: No suprapubic tenderness MSK: Normal tone.  Ambulating independently.  No gross abnormality of the knee appreciated.  No results found for this or any previous visit (from the past 24 hour(s)).   Assessment/ Plan: 25 y.o. female   Abnormal urinalysis - Plan: Urinalysis, Complete, Urine Culture, CMP14+EGFR, Bayer DCA Hb A1c Waived, Ambulatory referral to Nephrology  Proteinuria, unspecified type - Plan: Urinalysis, Complete, Urine Culture, CMP14+EGFR, Bayer DCA Hb A1c Waived, Ambulatory referral to Nephrology  Iron deficiency anemia,  unspecified iron deficiency anemia type - Plan: Anemia Profile B  Macromastia - Plan: Ambulatory referral to Plastic Surgery  Chronic pain of both knees - Plan: Ambulatory referral to Orthopedic Surgery  Urine culture, urinalysis ordered.  I reviewed the last urinalyses from the last 10 years which showed various degrees of protein, evidence of dehydration and/or infection.  I tried to reiterate to the mother today that these findings need to be taken in context as to what was going on at the time of collection.  If she was acutely ill at that time it would explain the various findings.  Nothing consistently showed proteinuria, ketonuria or other concerning abnormalities.  I  have placed a referral to nephrology per her request that she wants to "full work-up".  I have gone ahead and really ordered her anemia profile as she in fact had iron deficiency anemia in the past  With regards to her macromastia since she is having upper back pain I placed a referral to plastics for further evaluation and consideration of reduction  I will so that I am quite concerned about the various ailments that she describes.  She was certainly could have 1 universal issue that contributes to all of the symptoms but physically her exam was fairly unremarkable.  She is followed closely by psychiatry but psychiatry has not mentioned any concerns for psychosomatic manifestations of her anxiety and depression.  Will CC today's chart for further feedback.  She will continue following with her neurologist and OB/GYN for periodic limb movement disorder and endometriosis.  Referral placed for orthopedic surgery for evaluation of chronic pain in both knees.  I suspect she would benefit from a physical therapy program but family is requesting referral so that this has been placed  Orders Placed This Encounter  Procedures  . Urine Culture  . Urinalysis, Complete  . Anemia Profile B  . CMP14+EGFR  . Bayer DCA Hb A1c Waived  .  Ambulatory referral to Plastic Surgery    Referral Priority:   Routine    Referral Type:   Surgical    Referral Reason:   Specialty Services Required    Requested Specialty:   Plastic Surgery    Number of Visits Requested:   1  . Ambulatory referral to Orthopedic Surgery    Referral Priority:   Routine    Referral Type:   Surgical    Referral Reason:   Specialty Services Required    Requested Specialty:   Orthopedic Surgery    Number of Visits Requested:   1  . Ambulatory referral to Nephrology    Referral Priority:   Routine    Referral Type:   Consultation    Referral Reason:   Specialty Services Required    Requested Specialty:   Nephrology    Number of Visits Requested:   1   No orders of the defined types were placed in this encounter.    Janora Norlander, DO [place] [phone]

## 2021-01-31 LAB — ANEMIA PROFILE B
Basophils Absolute: 0.1 10*3/uL (ref 0.0–0.2)
Basos: 1 %
EOS (ABSOLUTE): 0 10*3/uL (ref 0.0–0.4)
Eos: 0 %
Ferritin: 10 ng/mL — ABNORMAL LOW (ref 15–150)
Folate: 10.9 ng/mL (ref >3.0)
Hematocrit: 43.1 % (ref 34.0–46.6)
Hemoglobin: 14.6 g/dL (ref 11.1–15.9)
Immature Grans (Abs): 0 10*3/uL (ref 0.0–0.1)
Immature Granulocytes: 0 %
Iron Saturation: 38 % (ref 15–55)
Iron: 116 ug/dL (ref 27–159)
Lymphocytes Absolute: 2.2 10*3/uL (ref 0.7–3.1)
Lymphs: 37 %
MCH: 31 pg (ref 26.6–33.0)
MCHC: 33.9 g/dL (ref 31.5–35.7)
MCV: 92 fL (ref 79–97)
Monocytes Absolute: 0.4 10*3/uL (ref 0.1–0.9)
Monocytes: 7 %
Neutrophils Absolute: 3.2 10*3/uL (ref 1.4–7.0)
Neutrophils: 55 %
Platelets: 249 10*3/uL (ref 150–450)
RBC: 4.71 x10E6/uL (ref 3.77–5.28)
RDW: 12.2 % (ref 11.7–15.4)
Retic Ct Pct: 1.1 % (ref 0.6–2.6)
Total Iron Binding Capacity: 305 ug/dL (ref 250–450)
UIBC: 189 ug/dL (ref 131–425)
Vitamin B-12: 260 pg/mL (ref 232–1245)
WBC: 5.9 10*3/uL (ref 3.4–10.8)

## 2021-01-31 LAB — CMP14+EGFR
ALT: 10 IU/L (ref 0–32)
AST: 13 IU/L (ref 0–40)
Albumin/Globulin Ratio: 2.1 (ref 1.2–2.2)
Albumin: 4.8 g/dL (ref 3.9–5.0)
Alkaline Phosphatase: 69 IU/L (ref 44–121)
BUN/Creatinine Ratio: 9 (ref 9–23)
BUN: 7 mg/dL (ref 6–20)
Bilirubin Total: 0.5 mg/dL (ref 0.0–1.2)
CO2: 25 mmol/L (ref 20–29)
Calcium: 10.1 mg/dL (ref 8.7–10.2)
Chloride: 102 mmol/L (ref 96–106)
Creatinine, Ser: 0.79 mg/dL (ref 0.57–1.00)
Globulin, Total: 2.3 g/dL (ref 1.5–4.5)
Glucose: 86 mg/dL (ref 65–99)
Potassium: 4.1 mmol/L (ref 3.5–5.2)
Sodium: 142 mmol/L (ref 134–144)
Total Protein: 7.1 g/dL (ref 6.0–8.5)
eGFR: 108 mL/min/{1.73_m2} (ref >59)

## 2021-02-01 LAB — URINE CULTURE

## 2021-02-10 ENCOUNTER — Encounter: Payer: Self-pay | Admitting: Physician Assistant

## 2021-02-11 ENCOUNTER — Ambulatory Visit: Payer: Medicare Other

## 2021-02-11 ENCOUNTER — Ambulatory Visit: Payer: Medicare Other | Admitting: Physician Assistant

## 2021-02-13 ENCOUNTER — Encounter: Payer: Self-pay | Admitting: Orthopedic Surgery

## 2021-02-24 ENCOUNTER — Other Ambulatory Visit: Payer: Self-pay

## 2021-02-24 ENCOUNTER — Ambulatory Visit (INDEPENDENT_AMBULATORY_CARE_PROVIDER_SITE_OTHER): Payer: Medicare Other | Admitting: Nurse Practitioner

## 2021-02-24 ENCOUNTER — Encounter: Payer: Self-pay | Admitting: Nurse Practitioner

## 2021-02-24 VITALS — Temp 97.8°F | Ht 63.0 in | Wt 127.0 lb

## 2021-02-24 DIAGNOSIS — M25532 Pain in left wrist: Secondary | ICD-10-CM

## 2021-02-24 MED ORDER — PREDNISONE 10 MG (21) PO TBPK: ORAL_TABLET | ORAL | 0 refills | Status: DC

## 2021-02-24 MED ORDER — NAPROXEN 500 MG PO TABS: 500.0000 mg | ORAL_TABLET | Freq: Two times a day (BID) | ORAL | 0 refills | Status: DC

## 2021-02-24 NOTE — Progress Notes (Signed)
Acute Office Visit  Subjective:    Patient ID: Samantha Dougherty, female    DOB: 10/13/1995, 25 y.o.   MRN: [id number]  Chief Complaint  Patient presents with  . Wrist Injury    HPI Patient is in today for Pain  She reports recurrent left wrist pain. was an injury that may have caused the pain. The pain started a few weeks ago and is unchanged. The pain does not radiate . The pain is described as aching, is moderate in intensity, occurring constantly. Symptoms are worse in the: morning, mid-day, afternoon, evening  Aggravating factors: movement Relieving factors: none.  She has tried acetaminophen with little relief.   ---------------------------------------------------------------------------------------------------   Past Medical History:  Diagnosis Date  . Anti-phospholipid antibody syndrome (HCC)   . Anxiety   . Asthma   . Bipolar 2 disorder ([place])   . Conversion disorder   . Depression   . DVT (deep venous thrombosis) (HCC)    LLE  . Fibromyalgia   . Gallbladder polyp   . H. pylori infection   . IBS (irritable bowel syndrome)   . Legal blindness    Right eye  . Migraine   . Mood disorder ([place])   . Psychogenic nonepileptic seizure   . Urinary tract infection     Past Surgical History:  Procedure Laterality Date  . COLONOSCOPY WITH ESOPHAGOGASTRODUODENOSCOPY (EGD)    . HYMENECTOMY  2016    Family History  Problem Relation Age of Onset  . Anxiety disorder Mother   . Depression Mother   . Hyperlipidemia Mother   . Bipolar disorder Mother   . Thyroid disease Maternal Grandmother   . Deep vein thrombosis Maternal Grandmother   . Thyroid disease Maternal Grandfather   . Anxiety disorder Father   . Depression Father   . Autism Sister   . Other Sister        [place] syndrome  . Factor V Leiden deficiency Maternal Aunt   . Breast cancer Maternal Aunt   . Bipolar disorder Maternal Aunt   . Diabetes Paternal Grandfather   . Deep vein thrombosis Maternal Aunt    . Bipolar disorder Maternal Aunt   . Colon cancer Neg Hx   . Esophageal cancer Neg Hx   . Rectal cancer Neg Hx     Social History   Socioeconomic History  . Marital status: Single    Spouse name: Not on file  . Number of children: 0  . Years of education: Not on file  . Highest education level: Not on file  Occupational History  . Occupation: Ship broker  Tobacco Use  . Smoking status: Never Smoker  . Smokeless tobacco: Never Used  Vaping Use  . Vaping Use: Never used  Substance and Sexual Activity  . Alcohol use: Never  . Drug use: Never  . Sexual activity: Yes    Birth control/protection: None, Condom  Other Topics Concern  . Not on file  Social History Narrative   From [place].  Was receiving medical care at [facility].  Resides with mother.   Social Determinants of Health   Financial Resource Strain: Not on file  Food Insecurity: Not on file  Transportation Needs: Not on file  Physical Activity: Not on file  Stress: Not on file  Social Connections: Not on file  Intimate Partner Violence: Not on file    Outpatient Medications Prior to Visit  Medication Sig Dispense Refill  . Galcanezumab-gnlm (EMGALITY) 120 MG/ML SOAJ INJECT 1 ML INTO THE  SKIN EVERY 30 DAYS.    Marland Kitchen lamoTRIgine (LAMICTAL) 100 MG tablet Take 1 tablet (100 mg total) by mouth 2 (two) times daily. 180 tablet 0  . LORazepam (ATIVAN) 0.5 MG tablet Take 1 tablet (0.5 mg total) by mouth 2 (two) times daily as needed for anxiety. 60 tablet 2  . REXULTI 0.5 MG TABS TAKE 1 TABLET BY MOUTH EVERY EVENING. 30 tablet 2  . venlafaxine XR (EFFEXOR-XR) 75 MG 24 hr capsule Take 1 capsule (75 mg total) by mouth daily with breakfast. 90 capsule 0   No facility-administered medications prior to visit.    Allergies  Allergen Reactions  . Sulfa Antibiotics Anaphylaxis  . Amitriptyline     Hallucinations  . Maxalt [Rizatriptan] Hives    Review of Systems  Constitutional: Negative.   HENT: Negative.   Respiratory:  Negative.   Gastrointestinal: Negative.   Genitourinary: Negative.   Musculoskeletal: Positive for joint swelling.  All other systems reviewed and are negative.      Objective:    Physical Exam Vitals reviewed.  Constitutional:      Appearance: Normal appearance.  HENT:     Head: Normocephalic.     Nose: Nose normal.  Eyes:     Conjunctiva/sclera: Conjunctivae normal.  Cardiovascular:     Rate and Rhythm: Normal rate and regular rhythm.     Pulses: Normal pulses.     Heart sounds: Normal heart sounds.  Pulmonary:     Effort: Pulmonary effort is normal.  Abdominal:     General: Bowel sounds are normal.  Musculoskeletal:     Left wrist: Tenderness present. Decreased range of motion.       Arms:     Comments: Left wrist brace from wrist injury.   Skin:    Findings: No rash.  Neurological:     Mental Status: She is alert and oriented to person, place, and time.     Temp 97.8 F (36.6 C) (Temporal)   Ht $R'5\' 3"'ms$  (1.6 m)   Wt 127 lb (57.6 kg)   SpO2 99%   BMI 22.50 kg/m  Wt Readings from Last 3 Encounters:  02/24/21 127 lb (57.6 kg)  01/30/21 127 lb 9.6 oz (57.9 kg)  01/16/21 127 lb (57.6 kg)    Health Maintenance Due  Topic Date Due  . Pneumococcal Vaccine 17-80 Years old (1 of 4 - PCV13) Never done  . HIV Screening  Never done  . Hepatitis C Screening  Never done  . TETANUS/TDAP  04/25/2019    There are no preventive care reminders to display for this patient.   Lab Results  Component Value Date   TSH 0.737 08/30/2018   Lab Results  Component Value Date   WBC 5.9 01/30/2021   HGB 14.6 01/30/2021   HCT 43.1 01/30/2021   MCV 92 01/30/2021   PLT 249 01/30/2021   Lab Results  Component Value Date   NA 142 01/30/2021   K 4.1 01/30/2021   CO2 25 01/30/2021   GLUCOSE 86 01/30/2021   BUN 7 01/30/2021   CREATININE 0.79 01/30/2021   BILITOT 0.5 01/30/2021   ALKPHOS 69 01/30/2021   AST 13 01/30/2021   ALT 10 01/30/2021   PROT 7.1 01/30/2021    ALBUMIN 4.8 01/30/2021   CALCIUM 10.1 01/30/2021   ANIONGAP 8 10/15/2018   EGFR 108 01/30/2021   Lab Results  Component Value Date   CHOL 171 04/14/2018   Lab Results  Component Value Date   HDL 50 04/14/2018  Lab Results  Component Value Date   LDLCALC 93 04/14/2018   Lab Results  Component Value Date   TRIG 140 04/14/2018   Lab Results  Component Value Date   CHOLHDL 3.4 04/14/2018   Lab Results  Component Value Date   HGBA1C 4.8 01/30/2021       Assessment & Plan:   Problem List Items Addressed This Visit      Other   Left wrist pain - Primary    Unresolved left wrist pain.  Patient has an appointment with orthopedic June 29.  Currently cast on patient's left wrist is stable, on assessment fingernails have adequately blood supply, no cyanosis observed, hand is warm to touch, patient rates pain as 6/10 from a pain scale of 0-10.  I encourage patient to give time for her wrist to heal, prednisone taper sent to pharmacy, advised patient to use Tylenol or ibuprofen as tolerated for pain and follow-up with scheduled appointment.  Printed handouts given patient verbalized understanding.  Follow-up with unresolved symptoms.      Relevant Medications   predniSONE (STERAPRED UNI-PAK 21 TAB) 10 MG (21) TBPK tablet   naproxen (NAPROSYN) 500 MG tablet       Meds ordered this encounter  Medications  . predniSONE (STERAPRED UNI-PAK 21 TAB) 10 MG (21) TBPK tablet    Sig: 6 tablet daily 5 tablet day 2, 4 tablet day 3, 3 tablet day 4, 2 tablet day 5, 1 tablet day 6.    Dispense:  1 each    Refill:  0    Order Specific Question:   Supervising Provider    Answer:   Janora Norlander [[id number]]  . naproxen (NAPROSYN) 500 MG tablet    Sig: Take 1 tablet (500 mg total) by mouth 2 (two) times daily with a meal.    Dispense:  30 tablet    Refill:  0    Order Specific Question:   Supervising Provider    Answer:   Janora Norlander [[id number]]     Ivy Lynn, NP

## 2021-02-24 NOTE — Assessment & Plan Note (Signed)
Unresolved left wrist pain.  Patient has an appointment with orthopedic June 29.  Currently cast on patient's left wrist is stable, on assessment fingernails have adequately blood supply, no cyanosis observed, hand is warm to touch, patient rates pain as 6/10 from a pain scale of 0-10.  I encourage patient to give time for her wrist to heal, prednisone taper sent to pharmacy, advised patient to use Tylenol or ibuprofen as tolerated for pain and follow-up with scheduled appointment.  Printed handouts given patient verbalized understanding.  Follow-up with unresolved symptoms.

## 2021-02-24 NOTE — Patient Instructions (Signed)
Wrist Pain, Adult There are many things that can cause wrist pain. Some common causes include:  An injury to the wrist.  Using the joint too much.  A condition that causes too much pressure to be put on a nerve in the wrist (carpal tunnel syndrome).  Wear and tear of the joints that happens as a person gets older (osteoarthritis).  A condition that causes swelling and stiffness in the joints (arthritis). Sometimes, the cause of wrist pain is not known. Often, the pain goes away when you follow your doctor's instructions for easing pain at home. This may include resting your wrist, icing your wrist, or using a splint or an elastic wrap for a short time. It is important to tell your doctor if your wrist pain does not go away. Follow these instructions at home: If you have a splint or elastic wrap:  Wear the splint or wrap as told by your doctor. Take it off only as told by your doctor. Ask if you can take it off for bathing.  Loosen the splint or wrap if your fingers: ? Tingle. ? Become numb. ? Turn cold and blue.  Check the skin around the splint or wrap every day. Tell your doctor about any concerns.  Keep the splint or wrap clean.  If the splint or wrap is not waterproof: ? Do not let it get wet. ? Cover it with a watertight covering when you take a bath or shower. Managing pain, stiffness, and swelling  If told, put ice on the painful area. To do this: ? If you have a removable splint or wrap, take it off as told by your doctor. ? Put ice in a plastic bag. ? Place a towel between your skin and the bag or between your splint or wrap and the bag. ? Leave the ice on for 20 minutes, 2-3 times a day.  Move your fingers often.  Raise (elevate) the injured area above the level of your heart while you are sitting or lying down.   Activity  Rest your wrist as told by your doctor.  Return to your normal activities as told by your doctor. Ask your doctor what activities are safe  for you.  Ask your doctor when it is safe to drive if you have a splint or wrap on your wrist.  Do exercises as told by your doctor. General instructions  Pay attention to any changes in your symptoms.  Take over-the-counter and prescription medicines only as told by your doctor.  Keep all follow-up visits as told by your doctor. This is important. Contact a doctor if:  You have a sudden, sharp pain in the wrist, hand, or arm that is different or new.  The swelling or bruising on your wrist or hand gets worse.  Your skin: ? Becomes red. ? Gets a rash. ? Has open sores.  Your pain does not get better.  Your pain gets worse.  You have a fever or chills. Get help right away if:  You lose feeling in your fingers or hand.  Your fingers turn white, very red, or cold and blue.  You cannot move your fingers. Summary  There are many things that can cause wrist pain.  It is important to tell your doctor if your wrist pain does not go away.  You may need to wear a splint or a wrap for a short period of time.  Return to your normal activities as told by your doctor. Ask your doctor   what activities are safe for you. This information is not intended to replace advice given to you by your health care provider. Make sure you discuss any questions you have with your health care provider. Document Revised: 07/26/2019 Document Reviewed: 07/26/2019 Elsevier Patient Education  2021 Elsevier Inc.  

## 2021-02-26 ENCOUNTER — Other Ambulatory Visit: Payer: Self-pay | Admitting: Nephrology

## 2021-02-26 ENCOUNTER — Other Ambulatory Visit (HOSPITAL_COMMUNITY): Payer: Self-pay | Admitting: Nephrology

## 2021-02-26 DIAGNOSIS — R809 Proteinuria, unspecified: Secondary | ICD-10-CM

## 2021-02-26 DIAGNOSIS — R319 Hematuria, unspecified: Secondary | ICD-10-CM

## 2021-03-07 NOTE — Progress Notes (Signed)
Virtual Visit via Video Note  I connected with Samantha Dougherty on 03/10/21 at 11:00 AM EDT by a video enabled telemedicine application and verified that I am speaking with the correct person using two identifiers.  Location: Patient: home Provider: office Persons participated in the visit- patient, provider    I discussed the limitations of evaluation and management by telemedicine and the availability of in person appointments. The patient expressed understanding and agreed to proceed.    I discussed the assessment and treatment plan with the patient. The patient was provided an opportunity to ask questions and all were answered. The patient agreed with the plan and demonstrated an understanding of the instructions.   The patient was advised to call back or seek an in-person evaluation if the symptoms worsen or if the condition fails to improve as anticipated.  I provided 15 minutes of non-face-to-face time during this encounter.   Neysa Hotter, MD    [facility] MD/PA/NP OP Progress Note  03/10/2021 11:38 AM Samantha Dougherty  MRN:  [id number]  Chief Complaint:  Chief Complaint   Follow-up; Depression; Anxiety    HPI:  This is a follow-up appointment for depression and anxiety.  She states that she feels  Ativan helps a lot for anxiety. She feels calmer when she takes this medication.  Although she still has intense anxiety, it has been better compared to before.  She has not been able to do exercise as she had wrist fracture while riding a horse several weeks ago.  She denies significant pain, and will be in cast for another few weeks.  She thinks she has been more productive.  She enjoys being with her family.  She continues to have seizure-like episode; has occasional loss of consciousness with vomiting, confusion and dizziness.  She has not been seen by neurologist for more than 1 year; she agrees to have follow-up.  She will have OB/GYN visit in a few days; she may need to resect colon due to  endometriosis.  She continues to struggle with abdominal pain.  She has initial and middle insomnia.  She has mild fatigue.  She thinks she has lost a few pounds over the past month.  She denies SI.  She denies alcohol use or drug use.  She is willing to try a higher dose of venlafaxine at this time.   Wt Readings from Last 3 Encounters:  02/24/21 127 lb (57.6 kg)  01/30/21 127 lb 9.6 oz (57.9 kg)  01/16/21 127 lb (57.6 kg)     Daily routine: plays basket ball at times when she does not have pain Employment: unemployed Household: parents, her 93 year old sister Marital status: single  Visit Diagnosis:    ICD-10-CM   1. GAD (generalized anxiety disorder)  F41.1     2. MDD (major depressive disorder), recurrent episode, mild (HCC)  F33.0       Past Psychiatric History: Please see initial evaluation for full details. I have reviewed the history. No updates at this time.     Past Medical History:  Past Medical History:  Diagnosis Date   Anti-phospholipid antibody syndrome (HCC)    Anxiety    Asthma    Bipolar 2 disorder (HCC)    Conversion disorder    Depression    DVT (deep venous thrombosis) (HCC)    LLE   Fibromyalgia    Gallbladder polyp    H. pylori infection    IBS (irritable bowel syndrome)    Legal blindness  Right eye   Migraine    Mood disorder (HCC)    Psychogenic nonepileptic seizure    Urinary tract infection     Past Surgical History:  Procedure Laterality Date   COLONOSCOPY WITH ESOPHAGOGASTRODUODENOSCOPY (EGD)     HYMENECTOMY  2016    Family Psychiatric History: Please see initial evaluation for full details. I have reviewed the history. No updates at this time.     Family History:  Family History  Problem Relation Age of Onset   Anxiety disorder Mother    Depression Mother    Hyperlipidemia Mother    Bipolar disorder Mother    Thyroid disease Maternal Grandmother    Deep vein thrombosis Maternal Grandmother    Thyroid disease Maternal  Grandfather    Anxiety disorder Father    Depression Father    Autism Sister    Other Sister        [place] syndrome   Factor V Leiden deficiency Maternal Aunt    Breast cancer Maternal Aunt    Bipolar disorder Maternal Aunt    Diabetes Paternal Grandfather    Deep vein thrombosis Maternal Aunt    Bipolar disorder Maternal Aunt    Colon cancer Neg Hx    Esophageal cancer Neg Hx    Rectal cancer Neg Hx     Social History:  Social History   Socioeconomic History   Marital status: Single    Spouse name: Not on file   Number of children: 0   Years of education: Not on file   Highest education level: Not on file  Occupational History   Occupation: student  Tobacco Use   Smoking status: Never   Smokeless tobacco: Never  Vaping Use   Vaping Use: Never used  Substance and Sexual Activity   Alcohol use: Never   Drug use: Never   Sexual activity: Yes    Birth control/protection: None, Condom  Other Topics Concern   Not on file  Social History Narrative   From [place].  Was receiving medical care at [facility].  Resides with mother.   Social Determinants of Health   Financial Resource Strain: Not on file  Food Insecurity: Not on file  Transportation Needs: Not on file  Physical Activity: Not on file  Stress: Not on file  Social Connections: Not on file    Allergies:  Allergies  Allergen Reactions   Sulfa Antibiotics Anaphylaxis   Amitriptyline     Hallucinations   Maxalt [Rizatriptan] Hives    Metabolic Disorder Labs: Lab Results  Component Value Date   HGBA1C 4.8 01/30/2021   No results found for: PROLACTIN Lab Results  Component Value Date   CHOL 171 04/14/2018   TRIG 140 04/14/2018   HDL 50 04/14/2018   CHOLHDL 3.4 04/14/2018   LDLCALC 93 04/14/2018   Lab Results  Component Value Date   TSH 0.737 08/30/2018    Therapeutic Level Labs: No results found for: LITHIUM No results found for: VALPROATE No components found for:  CBMZ  Current  Medications: Current Outpatient Medications  Medication Sig Dispense Refill   venlafaxine XR (EFFEXOR-XR) 37.5 MG 24 hr capsule Take 3 capsules (112.5 mg total) by mouth daily with breakfast. 90 capsule 2   Brexpiprazole (REXULTI) 0.5 MG TABS Take 1 tablet (0.5 mg total) by mouth every evening. 30 tablet 2   Galcanezumab-gnlm (EMGALITY) 120 MG/ML SOAJ INJECT 1 ML INTO THE SKIN EVERY 30 DAYS.     [START ON 03/23/2021] lamoTRIgine (LAMICTAL) 100 MG tablet  Take 1 tablet (100 mg total) by mouth 2 (two) times daily. 60 tablet 2   [START ON 03/23/2021] LORazepam (ATIVAN) 0.5 MG tablet Take 1 tablet (0.5 mg total) by mouth 2 (two) times daily as needed for anxiety. 60 tablet 2   naproxen (NAPROSYN) 500 MG tablet Take 1 tablet (500 mg total) by mouth 2 (two) times daily with a meal. 30 tablet 0   predniSONE (STERAPRED UNI-PAK 21 TAB) 10 MG (21) TBPK tablet 6 tablet daily 5 tablet day 2, 4 tablet day 3, 3 tablet day 4, 2 tablet day 5, 1 tablet day 6. 1 each 0   No current facility-administered medications for this visit.     Musculoskeletal: Strength & Muscle Tone:  N/A Gait & Station:  N/A Patient leans: N/A  Psychiatric Specialty Exam: Review of Systems  Psychiatric/Behavioral:  Positive for dysphoric mood and sleep disturbance. Negative for agitation, behavioral problems, confusion, decreased concentration, hallucinations, self-injury and suicidal ideas. The patient is nervous/anxious. The patient is not hyperactive.   All other systems reviewed and are negative.  There were no vitals taken for this visit.There is no height or weight on file to calculate BMI.  General Appearance: Fairly Groomed  Eye Contact:  Good  Speech:  Clear and Coherent  Volume:  Normal  Mood:   better  Affect:  Appropriate, Congruent, and calmer  Thought Process:  Coherent  Orientation:  Full (Time, Place, and Person)  Thought Content: Logical   Suicidal Thoughts:  No  Homicidal Thoughts:  No  Memory:  Immediate;    Good  Judgement:  Good  Insight:  Fair  Psychomotor Activity:  Normal  Concentration:  Concentration: Good and Attention Span: Good  Recall:  Good  Fund of Knowledge: Good  Language: Good  Akathisia:  No  Handed:  Right  AIMS (if indicated): not done  Assets:  Communication Skills Desire for Improvement  ADL's:  Intact  Cognition: WNL  Sleep:  Fair   Screenings: GAD-7    Flowsheet Row Office Visit from 01/30/2021 in [facility] Office Visit from 02/14/2020 in [facility]  Total GAD-7 Score 21 17      PHQ2-9    Flowsheet Row Office Visit from 01/30/2021 in [facility] Office Visit from 01/16/2021 in [facility] Video Visit from 12/22/2020 in [facility] Office Visit from 02/14/2020 in [facility] Office Visit from 10/19/2018 in [facility]  PHQ-2 Total Score 2 2 4 6 3   PHQ-9 Total Score 8 7 16 18 15         Assessment and Plan:  Samantha Dougherty is a 25 y.o. 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    1. GAD (generalized anxiety disorder) 2. MDD (major depressive disorder), recurrent episode, mild (HCC) There has been overall improvement in anxiety since switching from Xanax to lorazepam several months ago. Psychosocial stressors includes pain, which she attributes to endometriosis, medical condition of myalgia, seizure-like episode.  She is now amenable to try up titration of venlafaxine to optimize treatment for depression and anxiety.  Discussed potential risks, which includes but not limited to headache, diaphoresis.  Will continue lamotrigine for mood dysregulation. Noted that this medication used to be prescribed by neurology, and there was reportedly no indication for seizure.  Will  continue rexulti as adjunctive  treatment for depression.  Will continue lorazepam as needed for anxiety.   This clinician has discussed the side effect associated with medication prescribed during this encounter. Please refer to notes in the previous encounters for more details.     Plan I have reviewed and updated plans as below  1. Increase venlafaxine 112.5 mg daily  2. Continue Rexulti 0.5 mg daily  3. Continue lamotrigine 100 mg twice a day 4. Continue lorazepam 0.5 mg twice a day as needed for anxiety 5. Next appointment: 9/15 at 3 PM for 30 mins, video (she requested 3 months visit instead of sooner visit) - Referral for therapy  - on emgality monthly    This clinician has discussed the side effect associated with medication prescribed during this encounter. Please refer to notes in the previous encounters for more details.    Past trials of medication: sertraline, fluoxetine, Lexapro, duloxetine (headache, nightmares), elavil (hallucinations),  Rexulti, Abilify, Xanax, lamotrigine, temazepam   The patient demonstrates the following risk factors for suicide: Chronic risk factors for suicide include: psychiatric disorder of depression and chronic pain. Acute risk factors for suicide include: unemployment and social withdrawal/isolation. Protective factors for this patient include: positive social support, coping skills and hope for the future. Considering these factors, the overall suicide risk at this point appears to be low. Patient is appropriate for outpatient follow up.       Neysa Hottereina Larell Baney, MD 03/10/2021, 11:38 AM

## 2021-03-10 ENCOUNTER — Encounter: Payer: Self-pay | Admitting: Psychiatry

## 2021-03-10 ENCOUNTER — Other Ambulatory Visit: Payer: Self-pay

## 2021-03-10 ENCOUNTER — Telehealth (INDEPENDENT_AMBULATORY_CARE_PROVIDER_SITE_OTHER): Payer: Medicare Other | Admitting: Psychiatry

## 2021-03-10 ENCOUNTER — Telehealth: Payer: Self-pay

## 2021-03-10 ENCOUNTER — Other Ambulatory Visit: Payer: Self-pay | Admitting: Psychiatry

## 2021-03-10 DIAGNOSIS — F411 Generalized anxiety disorder: Secondary | ICD-10-CM | POA: Diagnosis not present

## 2021-03-10 DIAGNOSIS — F330 Major depressive disorder, recurrent, mild: Secondary | ICD-10-CM

## 2021-03-10 MED ORDER — VENLAFAXINE HCL ER 75 MG PO CP24: ORAL_CAPSULE | ORAL | 2 refills | Status: DC

## 2021-03-10 MED ORDER — VENLAFAXINE HCL ER 37.5 MG PO CP24: 112.5000 mg | ORAL_CAPSULE | Freq: Every day | ORAL | 2 refills | Status: DC

## 2021-03-10 MED ORDER — REXULTI 0.5 MG PO TABS: 1.0000 | ORAL_TABLET | Freq: Every evening | ORAL | 2 refills | Status: DC

## 2021-03-10 MED ORDER — LAMOTRIGINE 100 MG PO TABS: 100.0000 mg | ORAL_TABLET | Freq: Two times a day (BID) | ORAL | 2 refills | Status: DC

## 2021-03-10 MED ORDER — VENLAFAXINE HCL ER 37.5 MG PO CP24: ORAL_CAPSULE | ORAL | 2 refills | Status: DC

## 2021-03-10 MED ORDER — LORAZEPAM 0.5 MG PO TABS: 0.5000 mg | ORAL_TABLET | Freq: Two times a day (BID) | ORAL | 2 refills | Status: DC | PRN

## 2021-03-10 NOTE — Patient Instructions (Signed)
1. Increase venlafaxine 112.5 mg daily  2. Continue Rexulti 0.5 mg daily  3. Continue lamotrigine 100 mg twice a day 4. Continue lorazepam 0.5 mg twice a day as needed for anxiety 5. Next appointment: 9/15 at 3 PM

## 2021-03-10 NOTE — Telephone Encounter (Signed)
Please contact the pharmacy and cancel 37.5 mg 3 caps order- new orders of 37.5 mg plus 75 mg daily sent in.

## 2021-03-10 NOTE — Telephone Encounter (Signed)
received a fax from pharmacy that patient insurance will not pay for 3 capsules daily it requires use of another strength

## 2021-03-11 NOTE — Telephone Encounter (Signed)
faxed and confirmed notice to cancel the 37.5 mg 3 capsules and it was replaced with 37.5 and a 75mg 

## 2021-04-07 ENCOUNTER — Telehealth: Payer: Self-pay

## 2021-04-07 NOTE — Telephone Encounter (Signed)
Decline. She was unable to get 90 days refill the last time, and asked for monthly refills.

## 2021-04-07 NOTE — Telephone Encounter (Signed)
received a fax requesting a 90 day supply of the venlafaxine

## 2021-04-15 ENCOUNTER — Encounter: Payer: Self-pay | Admitting: Physical Therapy

## 2021-04-15 ENCOUNTER — Ambulatory Visit: Payer: Medicare Other | Admitting: Physical Therapy

## 2021-04-27 ENCOUNTER — Telehealth: Payer: Self-pay

## 2021-04-27 NOTE — Telephone Encounter (Signed)
Decline. She preferred monthly refill

## 2021-04-27 NOTE — Telephone Encounter (Signed)
received fax requesting a 90 day supply of the lamottrigine

## 2021-04-28 NOTE — Telephone Encounter (Signed)
Faxed response back to pharmacy.  

## 2021-05-04 ENCOUNTER — Other Ambulatory Visit: Payer: Self-pay

## 2021-05-04 ENCOUNTER — Ambulatory Visit (HOSPITAL_COMMUNITY)
Admission: RE | Admit: 2021-05-04 | Discharge: 2021-05-04 | Disposition: A | Discharge status: Home / Self Care | Payer: Medicare Other | Source: Ambulatory Visit | Attending: Nephrology | Admitting: Nephrology

## 2021-05-04 DIAGNOSIS — R319 Hematuria, unspecified: Secondary | ICD-10-CM

## 2021-05-04 DIAGNOSIS — R809 Proteinuria, unspecified: Secondary | ICD-10-CM | POA: Insufficient documentation

## 2021-05-08 ENCOUNTER — Other Ambulatory Visit: Payer: Self-pay | Admitting: Psychiatry

## 2021-05-08 ENCOUNTER — Telehealth: Payer: Self-pay

## 2021-05-08 NOTE — Telephone Encounter (Signed)
received fax requesting a 90 day supply on the venlafaxine 75mg 

## 2021-05-08 NOTE — Telephone Encounter (Signed)
Could you contact the pharmacy to verify this request. I recall that she could not get refill for 90 days despite my 90 days order in the past. This is why refills were switched to monthly refills.

## 2021-05-12 ENCOUNTER — Telehealth (INDEPENDENT_AMBULATORY_CARE_PROVIDER_SITE_OTHER): Payer: Medicare Other | Admitting: Family Medicine

## 2021-05-12 DIAGNOSIS — R1084 Generalized abdominal pain: Secondary | ICD-10-CM

## 2021-05-12 DIAGNOSIS — R194 Change in bowel habit: Secondary | ICD-10-CM | POA: Diagnosis not present

## 2021-05-12 DIAGNOSIS — R070 Pain in throat: Secondary | ICD-10-CM

## 2021-05-12 DIAGNOSIS — F411 Generalized anxiety disorder: Secondary | ICD-10-CM

## 2021-05-12 DIAGNOSIS — F449 Dissociative and conversion disorder, unspecified: Secondary | ICD-10-CM

## 2021-05-12 MED ORDER — ONDANSETRON 4 MG PO TBDP: 4.0000 mg | ORAL_TABLET | Freq: Three times a day (TID) | ORAL | 0 refills | Status: AC | PRN

## 2021-05-12 NOTE — Progress Notes (Signed)
MyChart Video visit  Subjective: CC: change in bowel habits PCP: Raliegh Ip, DO Samantha Dougherty is a 25 y.o. female. Patient provides verbal consent for consult held via video.  Due to COVID-19 pandemic this visit was conducted virtually. This visit type was conducted due to national recommendations for restrictions regarding the COVID-19 Pandemic (e.g. social distancing, sheltering in place) in an effort to limit this patient's exposure and mitigate transmission in our community. All issues noted in this document were discussed and addressed.  A physical exam was not performed with this format.   Location of patient: home Location of provider: [facility] Others present for call: mom  1. Abdominal pain, change in BMs She reports severe abdominal pain associated with BMs.  She goes more frequently than usual.  No diarrhea, constipation, straining, hematochezia, melena.  She reports mucus in stool.  She feels like her abdomen is spasming.  She has associated nausea.  Sometimes pain is so bad that she feels like she will pass out.  Zofran has helped with nausea.  Needs a refill.  2. Anxiety Does not feel it is well controlled.  She does not feel like she is connecting and being heard by her current psychiatrist.  Would like to establish with someone else.  3. Neck pain Reports external neck pain.  Points to cricoid area.  Has had normal thyroid levels previously.    4. Genetic testing Wants to know if genetic testing can be done (generalized).  Feels like she has too much going on  ROS: Per HPI  Allergies  Allergen Reactions   Sulfa Antibiotics Anaphylaxis   Amitriptyline     Hallucinations   Maxalt [Rizatriptan] Hives   Past Medical History:  Diagnosis Date   Anti-phospholipid antibody syndrome (HCC)    Anxiety    Asthma    Bipolar 2 disorder (HCC)    Conversion disorder    Depression    DVT (deep venous thrombosis) (HCC)    LLE   Fibromyalgia    Gallbladder polyp     H. pylori infection    IBS (irritable bowel syndrome)    Legal blindness    Right eye   Migraine    Mood disorder (HCC)    Psychogenic nonepileptic seizure    Urinary tract infection     Current Outpatient Medications:    Brexpiprazole (REXULTI) 0.5 MG TABS, Take 1 tablet (0.5 mg total) by mouth every evening., Disp: 30 tablet, Rfl: 2   Galcanezumab-gnlm (EMGALITY) 120 MG/ML SOAJ, INJECT 1 ML INTO THE SKIN EVERY 30 DAYS., Disp: , Rfl:    lamoTRIgine (LAMICTAL) 100 MG tablet, Take 1 tablet (100 mg total) by mouth 2 (two) times daily., Disp: 60 tablet, Rfl: 2   LORazepam (ATIVAN) 0.5 MG tablet, Take 1 tablet (0.5 mg total) by mouth 2 (two) times daily as needed for anxiety., Disp: 60 tablet, Rfl: 2   naproxen (NAPROSYN) 500 MG tablet, Take 1 tablet (500 mg total) by mouth 2 (two) times daily with a meal., Disp: 30 tablet, Rfl: 0   predniSONE (STERAPRED UNI-PAK 21 TAB) 10 MG (21) TBPK tablet, 6 tablet daily 5 tablet day 2, 4 tablet day 3, 3 tablet day 4, 2 tablet day 5, 1 tablet day 6., Disp: 1 each, Rfl: 0   venlafaxine XR (EFFEXOR-XR) 37.5 MG 24 hr capsule, 112.5 mg daily. Take along with 75 mg cap, Disp: 90 capsule, Rfl: 2   venlafaxine XR (EFFEXOR-XR) 75 MG 24 hr capsule, 112.5 mg daily. Take  along with 37.5 mg cap, Disp: 30 capsule, Rfl: 2  Gen: tired appearing female HEENT: no goiter/ exophthalmos Psych: appears depressed/ anxious  Assessment/ Plan: 25 y.o. female   Change in bowel habit - Plan: Ambulatory referral to Gastroenterology  Generalized abdominal pain - Plan: Ambulatory referral to Gastroenterology, ondansetron (ZOFRAN ODT) 4 MG disintegrating tablet  Throat pain in adult - Plan: TSH, T4, free  Conversion disorder  Generalized anxiety disorder  Referral to GI.  Unsure what the etiology is of her pain.  Wonder if psychosomatic manifestation of anxiety? She doesn't endorse constipation or diarrhea.  I'm glad to treat her associated nausea but likely won't fix  underlying issue.    Uncertain etiology of external throat pain.  Check thyroid labs to ensure not acute thyroiditis.  Nothing appreciated externally on video (no obvious goiter/ exophthalmos, etc)  Interested in establishing care with new psychiatrist.  Having feelings of disjointedness with current psychiatrist.  Gave information for [facility] today.  She will call if needs referral.  Encouraged her to consider 23&me.  Without specific genetic concern, would not know who to send her to for this.  Start time: 5:06pm (text sent); 2ng text sent 5:07pm End time: 5:26pm  Total time spent on patient care (including video visit/ documentation): 18 minutes  Samantha Dougherty Hulen Skains, DO [facility] [phone]

## 2021-05-15 ENCOUNTER — Institutional Professional Consult (permissible substitution): Payer: Medicare Other | Admitting: Plastic Surgery

## 2021-05-18 ENCOUNTER — Telehealth: Payer: Medicare Other | Admitting: Family Medicine

## 2021-05-19 ENCOUNTER — Emergency Department (HOSPITAL_COMMUNITY)
Admission: EM | Admit: 2021-05-19 | Discharge: 2021-05-20 | Disposition: A | Discharge status: Home / Self Care | Payer: Medicare Other | Attending: Emergency Medicine | Admitting: Emergency Medicine

## 2021-05-19 ENCOUNTER — Encounter (HOSPITAL_COMMUNITY): Payer: Self-pay

## 2021-05-19 ENCOUNTER — Encounter: Payer: Self-pay | Admitting: Family Medicine

## 2021-05-19 ENCOUNTER — Other Ambulatory Visit: Payer: Self-pay | Admitting: Family Medicine

## 2021-05-19 ENCOUNTER — Other Ambulatory Visit: Payer: Self-pay

## 2021-05-19 DIAGNOSIS — R55 Syncope and collapse: Secondary | ICD-10-CM

## 2021-05-19 DIAGNOSIS — R39198 Other difficulties with micturition: Secondary | ICD-10-CM | POA: Insufficient documentation

## 2021-05-19 DIAGNOSIS — J45909 Unspecified asthma, uncomplicated: Secondary | ICD-10-CM | POA: Insufficient documentation

## 2021-05-19 DIAGNOSIS — R569 Unspecified convulsions: Secondary | ICD-10-CM | POA: Diagnosis present

## 2021-05-19 DIAGNOSIS — F449 Dissociative and conversion disorder, unspecified: Secondary | ICD-10-CM

## 2021-05-19 DIAGNOSIS — F411 Generalized anxiety disorder: Secondary | ICD-10-CM

## 2021-05-19 NOTE — ED Triage Notes (Signed)
BIB [facility] c/o "seizures". Per EMS, was not completely oriented at scene. 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

## 2021-05-19 NOTE — Telephone Encounter (Signed)
Referral palced

## 2021-05-20 DIAGNOSIS — R569 Unspecified convulsions: Secondary | ICD-10-CM | POA: Diagnosis not present

## 2021-05-20 LAB — CBC WITH DIFFERENTIAL/PLATELET
Abs Immature Granulocytes: 0.03 K/uL (ref 0.00–0.07)
Basophils Absolute: 0 K/uL (ref 0.0–0.1)
Basophils Relative: 1 %
Eosinophils Absolute: 0.1 K/uL (ref 0.0–0.5)
Eosinophils Relative: 1 %
HCT: 40.5 % (ref 36.0–46.0)
Hemoglobin: 13.3 g/dL (ref 12.0–15.0)
Immature Granulocytes: 0 %
Lymphocytes Relative: 31 %
Lymphs Abs: 2.7 K/uL (ref 0.7–4.0)
MCH: 31 pg (ref 26.0–34.0)
MCHC: 32.8 g/dL (ref 30.0–36.0)
MCV: 94.4 fL (ref 80.0–100.0)
Monocytes Absolute: 0.5 K/uL (ref 0.1–1.0)
Monocytes Relative: 6 %
Neutro Abs: 5.4 K/uL (ref 1.7–7.7)
Neutrophils Relative %: 61 %
Platelets: 247 K/uL (ref 150–400)
RBC: 4.29 MIL/uL (ref 3.87–5.11)
RDW: 11.7 % (ref 11.5–15.5)
WBC: 8.8 K/uL (ref 4.0–10.5)
nRBC: 0 % (ref 0.0–0.2)

## 2021-05-20 LAB — COMPREHENSIVE METABOLIC PANEL WITH GFR
ALT: 12 U/L (ref 0–44)
AST: 17 U/L (ref 15–41)
Albumin: 4 g/dL (ref 3.5–5.0)
Alkaline Phosphatase: 53 U/L (ref 38–126)
Anion gap: 6 (ref 5–15)
BUN: 7 mg/dL (ref 6–20)
CO2: 28 mmol/L (ref 22–32)
Calcium: 9.2 mg/dL (ref 8.9–10.3)
Chloride: 105 mmol/L (ref 98–111)
Creatinine, Ser: 0.57 mg/dL (ref 0.44–1.00)
GFR, Estimated: >60 mL/min
Glucose, Bld: 102 mg/dL — ABNORMAL HIGH (ref 70–99)
Potassium: 3.4 mmol/L — ABNORMAL LOW (ref 3.5–5.1)
Sodium: 139 mmol/L (ref 135–145)
Total Bilirubin: 0.4 mg/dL (ref 0.3–1.2)
Total Protein: 6.9 g/dL (ref 6.5–8.1)

## 2021-05-20 MED ORDER — NAPROXEN 250 MG PO TABS
500.0000 mg | ORAL_TABLET | Freq: Once | ORAL | Status: AC
  Administered 2021-05-20: 500 mg via ORAL
  Filled 2021-05-20: qty 2

## 2021-05-20 NOTE — ED Notes (Signed)
Dr. Clayborne Dana informed pt that she needs to provide a urine specimen for completion of care. Pt reports that she would like to defer urine and be discharged.

## 2021-05-20 NOTE — ED Provider Notes (Signed)
[name] EMERGENCY DEPARTMENT Provider Note   CSN: [id number] Arrival date & time: 05/19/21  2306     History Chief Complaint  Patient presents with   Seizures    Samantha Dougherty is a 25 y.o. female.  25 year old female who presents sensitiveness from today secondary to a syncopal/near syncopal event associated with nonepileptic seizure.  Patient states that her mother was there and saw.  She did not fall or hit her head or have any other injuries.  She has been dealing with some left-sided back pain and some abdominal pain bilaterally for the last few months.  No urinary symptoms or vaginal symptoms.  No prodrome to the syncopal episode she was urinating .   Seizures     Past Medical History:  Diagnosis Date   Anti-phospholipid antibody syndrome (HCC)    Anxiety    Asthma    Bipolar 2 disorder (HCC)    Conversion disorder    Depression    DVT (deep venous thrombosis) (HCC)    LLE   Fibromyalgia    Gallbladder polyp    H. 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   Pelvic pain 12/13/2018   History of seizures 12/13/2018   Encounter for gynecological examination with Papanicolaou smear of cervix 10/19/2018   Menorrhagia with irregular cycle 10/19/2018   Cysts of both ovaries 10/19/2018   Dysmenorrhea 10/19/2018   Encounter for menstrual regulation 10/19/2018   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   Seizure-like activity (HCC) 04/14/2018   Migraine without status migrainosus, not intractable 04/14/2018   Chronic low  back pain 04/12/2018   History of DVT of lower extremity 04/12/2018   Legal blindness 04/12/2018   POTS (postural orthostatic tachycardia syndrome) 04/12/2018   Conversion disorder 04/12/2018   IBS (irritable bowel syndrome) 04/12/2018   Dextroscoliosis 04/12/2018   Diarrhea 05/26/2017   Occipital neuralgia 04/04/2017   Psychogenic nonepileptic seizure 04/04/2017   Vitamin D deficiency 02/03/2017   Audible heartbeat in right ear 11/30/2016   Other abnormalities of gait and mobility 11/30/2016   DVT (deep venous thrombosis) (HCC) 08/05/2016   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   Frequent headaches 02/06/2016   Recurrent subluxation of patella, unspecified knee 01/22/2015   Palpitations 10/11/2014   Body aches 06/21/2014   Retention of urine, unspecified 06/20/2014   Disordered eating 05/10/2014   Malnutrition (HCC) 05/10/2014   Tinea corporis 05/10/2014   Fatigue 03/26/2014   Anxiety 03/26/2014   Loose stools 03/26/2014   Poor appetite 03/26/2014   Weight loss 03/26/2014   Chronic fatigue syndrome 03/26/2014   Chronic headaches 03/26/2014   Insomnia 12/18/2013   Cervical adenopathy 12/11/2013   Increased frequency of urination 08/29/2013   Dyspepsia 07/16/2013   Carnitine deficiency (HCC) 07/31/2012    Past Surgical History:  Procedure Laterality Date   COLONOSCOPY WITH ESOPHAGOGASTRODUODENOSCOPY (EGD)     HYMENECTOMY  2016     OB History     Gravida  0   Para  0   Term  0   Preterm  0   AB  0   Living  0      SAB  0   IAB  0   Ectopic  0   Multiple  0   Live Births  0           Family History  Problem Relation Age of Onset   Anxiety disorder Mother    Depression Mother    Hyperlipidemia Mother    Bipolar disorder Mother    Thyroid disease Maternal Grandmother    Deep vein thrombosis Maternal Grandmother    Thyroid disease Maternal Grandfather    Anxiety disorder Father    Depression Father    Autism Sister    Other Sister         [place] syndrome   Factor V Leiden deficiency Maternal Aunt    Breast cancer Maternal Aunt    Bipolar disorder Maternal Aunt    Diabetes Paternal Grandfather    Deep vein thrombosis Maternal Aunt    Bipolar disorder Maternal Aunt    Colon cancer Neg Hx    Esophageal cancer Neg Hx    Rectal cancer Neg Hx     Social History   Tobacco Use   Smoking status: Never   Smokeless tobacco: Never  Vaping Use   Vaping Use: Never used  Substance Use Topics   Alcohol use: Never   Drug use: Never    Home Medications Prior to Admission medications   Medication Sig Start Date End Date Taking? Authorizing Provider  Brexpiprazole (REXULTI) 0.5 MG TABS Take 1 tablet (0.5 mg total) by mouth every evening. 03/10/21 06/08/21  Hisada, Barbee Cough, MD  Galcanezumab-gnlm (EMGALITY) 120 MG/ML SOAJ INJECT 1 ML INTO THE SKIN EVERY 30 DAYS. 12/23/20   [provider]  lamoTRIgine (LAMICTAL) 100 MG tablet Take 1 tablet (100 mg total) by mouth 2 (two) times daily. 03/23/21   Neysa Hotter, MD  LORazepam (ATIVAN) 0.5 MG tablet Take 1 tablet (0.5 mg total) by mouth 2 (two) times daily as needed for anxiety. 03/23/21 06/21/21  Neysa Hotter, MD  ondansetron (ZOFRAN ODT) 4 MG disintegrating tablet Take 1 tablet (4 mg total) by mouth every 8 (eight) hours as needed for nausea or vomiting. 05/12/21   Delynn Flavin M, DO  venlafaxine XR (EFFEXOR-XR) 37.5 MG 24 hr capsule 112.5 mg daily. Take along with 75 mg cap 03/10/21   Neysa Hotter, MD  venlafaxine XR (EFFEXOR-XR) 75 MG 24 hr capsule 112.5 mg daily. Take along with 37.5 mg cap 03/10/21   Neysa Hotter, MD    Allergies    Sulfa antibiotics, Amitriptyline, and Maxalt [rizatriptan]  Review of Systems   Review of Systems  Neurological:  Positive for seizures.  All other systems reviewed and are negative.  Physical Exam Updated Vital Signs BP 102/62   Pulse 76   Temp 98.4 F (36.9 C) (Oral)   Resp 15   Ht 5\' 3"  (1.6 m)   Wt 58.1 kg   SpO2 97%   BMI  22.67 kg/m   Physical Exam Vitals and nursing note reviewed.  Constitutional:      Appearance: She is well-developed.  HENT:     Head: Normocephalic and atraumatic.     Mouth/Throat:     Mouth: Mucous membranes are moist.     Pharynx: Oropharynx is clear.  Eyes:     Pupils: Pupils are equal, round, and reactive to light.  Cardiovascular:  Rate and Rhythm: Normal rate and regular rhythm.  Pulmonary:     Effort: No respiratory distress.     Breath sounds: No stridor.  Abdominal:     General: Abdomen is flat. There is no distension.  Musculoskeletal:     Cervical back: Normal range of motion.  Skin:    General: Skin is warm and dry.  Neurological:     General: No focal deficit present.     Mental Status: She is alert.    ED Results / Procedures / Treatments   Labs (all labs ordered are listed, but only abnormal results are displayed) Labs Reviewed  COMPREHENSIVE METABOLIC PANEL - Abnormal; Notable for the following components:      Result Value   Potassium 3.4 (*)    Glucose, Bld 102 (*)    All other components within normal limits  CBC WITH DIFFERENTIAL/PLATELET  LAMOTRIGINE LEVEL  URINALYSIS, ROUTINE W REFLEX MICROSCOPIC    EKG None  Radiology No results found.  Procedures Procedures   Medications Ordered in ED Medications  naproxen (NAPROSYN) tablet 500 mg (500 mg Oral Given 05/20/21 0239)    ED Course  I have reviewed the triage vital signs and the nursing notes.  Pertinent labs & imaging results that were available during my care of the patient were reviewed by me and considered in my medical decision making (see chart for details).    MDM Rules/Calculators/A&P                         Here with micturition syncope. No obvious red flags or signfiicant derangements to suggest need for further workup or hospitalization.    Final Clinical Impression(s) / ED Diagnoses Final diagnoses:  Seizure-like activity [facility])  Micturition syncope    Rx /  DC Orders ED Discharge Orders     None        Kimari Coudriet, Barbara Cower, MD 05/20/21 [phone]

## 2021-05-21 LAB — LAMOTRIGINE LEVEL: Lamotrigine Lvl: 3.5 ug/mL (ref 2.0–20.0)

## 2021-05-27 ENCOUNTER — Other Ambulatory Visit: Payer: Self-pay

## 2021-05-27 ENCOUNTER — Encounter: Payer: Self-pay | Admitting: Family Medicine

## 2021-05-27 ENCOUNTER — Other Ambulatory Visit: Payer: Medicare Other

## 2021-05-29 ENCOUNTER — Telehealth: Payer: Self-pay | Admitting: Family Medicine

## 2021-05-29 NOTE — Telephone Encounter (Signed)
Mother aware to go straight to ER or urgent care and verbalized understanding . FYI

## 2021-06-01 ENCOUNTER — Telehealth: Payer: Self-pay | Admitting: Family Medicine

## 2021-06-01 NOTE — Telephone Encounter (Signed)
Appointment scheduled with Kari Baars on 06/03/21 at 11:50 am

## 2021-06-03 ENCOUNTER — Other Ambulatory Visit: Payer: Self-pay

## 2021-06-03 ENCOUNTER — Encounter: Payer: Self-pay | Admitting: Family Medicine

## 2021-06-03 ENCOUNTER — Ambulatory Visit (INDEPENDENT_AMBULATORY_CARE_PROVIDER_SITE_OTHER): Payer: Medicare Other | Admitting: Family Medicine

## 2021-06-03 VITALS — BP 100/69 | HR 75 | Temp 97.3°F | Ht 63.0 in | Wt 130.0 lb

## 2021-06-03 DIAGNOSIS — R300 Dysuria: Secondary | ICD-10-CM

## 2021-06-03 DIAGNOSIS — Z09 Encounter for follow-up examination after completed treatment for conditions other than malignant neoplasm: Secondary | ICD-10-CM

## 2021-06-03 DIAGNOSIS — R000 Tachycardia, unspecified: Secondary | ICD-10-CM

## 2021-06-03 DIAGNOSIS — R42 Dizziness and giddiness: Secondary | ICD-10-CM | POA: Diagnosis not present

## 2021-06-03 DIAGNOSIS — R55 Syncope and collapse: Secondary | ICD-10-CM

## 2021-06-03 LAB — URINALYSIS, COMPLETE
Bilirubin, UA: NEGATIVE
Glucose, UA: NEGATIVE
Ketones, UA: NEGATIVE
Leukocytes,UA: NEGATIVE
Nitrite, UA: NEGATIVE
Protein,UA: NEGATIVE
RBC, UA: NEGATIVE
Specific Gravity, UA: 1.02 (ref 1.005–1.030)
Urobilinogen, Ur: 0.2 mg/dL (ref 0.2–1.0)
pH, UA: 6 (ref 5.0–7.5)

## 2021-06-03 LAB — MICROSCOPIC EXAMINATION
Bacteria, UA: NONE SEEN
Epithelial Cells (non renal): NONE SEEN /hpf (ref 0–10)
RBC, Urine: NONE SEEN /hpf (ref 0–2)
Renal Epithel, UA: NONE SEEN /hpf
WBC, UA: NONE SEEN /hpf (ref 0–5)

## 2021-06-03 NOTE — Progress Notes (Addendum)
Virtual Visit via Video Note  I connected with Samantha Dougherty on 06/04/21 at  3:00 PM EDT by a video enabled telemedicine application and verified that I am speaking with the correct person using two identifiers.  Location: Patient: home Provider: office   I discussed the limitations of evaluation and management by telemedicine and the availability of in person appointments. The patient expressed understanding and agreed to proceed.      I discussed the assessment and treatment plan with the patient. The patient was provided an opportunity to ask questions and all were answered. The patient agreed with the plan and demonstrated an understanding of the instructions.   The patient was advised to call back or seek an in-person evaluation if the symptoms worsen or if the condition fails to improve as anticipated.  I provided 15 minutes of non-face-to-face time during this encounter.   Neysa Hotter, MD    [facility] MD/PA/NP OP Progress Note  06/04/2021 3:35 PM Samantha Dougherty  MRN:  [id number]  Chief Complaint:  Chief Complaint   Follow-up; Anxiety; Depression    HPI:  This is a follow-up appointment for depression and anxiety.  She states that she has been doing well.  Although she continues to feel anxious at times, she believes she has been able to handle it well.  She reports good support system, including her parents.  She has been able to play basketball at times.  She had some infection in her stomach, and is trying to find a right provider for this.  She will be seen by OB/GYN for her abdominal pain. She had seizure-like episode the other day; she is having this few times a week.  When she is asked about her experience of having physical symptoms, she states that although it is not fun, she believes she has been handling it well.  She did not try higher dose of venlafaxine as she did not think it is needed.  She believes her current medication regimen works well for her, and would like to  stay the same.  She has depressive symptoms as in PHQ-9.  She denies SI.  She takes lorazepam twice a day for anxiety.  She denies alcohol use or drug use.   Visit Diagnosis:    ICD-10-CM   1. GAD (generalized anxiety disorder)  F41.1     2. MDD (major depressive disorder), recurrent episode, mild (HCC)  F33.0       Past Psychiatric History: Please see initial evaluation for full details. I have reviewed the history. No updates at this time.     Past Medical History:  Past Medical History:  Diagnosis Date   Anti-phospholipid antibody syndrome (HCC)    Anxiety    Asthma    Bipolar 2 disorder (HCC)    Conversion disorder    Depression    DVT (deep venous thrombosis) (HCC)    LLE   Fibromyalgia    Gallbladder polyp    H. pylori infection    IBS (irritable bowel syndrome)    Legal blindness    Right eye   Migraine    Mood disorder (HCC)    Psychogenic nonepileptic seizure    Urinary tract infection     Past Surgical History:  Procedure Laterality Date   COLONOSCOPY WITH ESOPHAGOGASTRODUODENOSCOPY (EGD)     HYMENECTOMY  2016    Family Psychiatric History: Please see initial evaluation for full details. I have reviewed the history. No updates at this time.  Family History:  Family History  Problem Relation Age of Onset   Anxiety disorder Mother    Depression Mother    Hyperlipidemia Mother    Bipolar disorder Mother    Thyroid disease Maternal Grandmother    Deep vein thrombosis Maternal Grandmother    Thyroid disease Maternal Grandfather    Anxiety disorder Father    Depression Father    Autism Sister    Other Sister        [place] syndrome   Factor V Leiden deficiency Maternal Aunt    Breast cancer Maternal Aunt    Bipolar disorder Maternal Aunt    Diabetes Paternal Grandfather    Deep vein thrombosis Maternal Aunt    Bipolar disorder Maternal Aunt    Colon cancer Neg Hx    Esophageal cancer Neg Hx    Rectal cancer Neg Hx     Social History:   Social History   Socioeconomic History   Marital status: Single    Spouse name: Not on file   Number of children: 0   Years of education: Not on file   Highest education level: Not on file  Occupational History   Occupation: student  Tobacco Use   Smoking status: Never   Smokeless tobacco: Never  Vaping Use   Vaping Use: Never used  Substance and Sexual Activity   Alcohol use: Never   Drug use: Never   Sexual activity: Yes    Birth control/protection: None, Condom  Other Topics Concern   Not on file  Social History Narrative   From [place].  Was receiving medical care at [facility].  Resides with mother.   Social Determinants of Health   Financial Resource Strain: Not on file  Food Insecurity: Not on file  Transportation Needs: Not on file  Physical Activity: Not on file  Stress: Not on file  Social Connections: Not on file    Allergies:  Allergies  Allergen Reactions   Sulfa Antibiotics Anaphylaxis and Shortness Of Breath   Amitriptyline Other (See Comments)    Hallucinations   Maxalt [Rizatriptan] Hives    Metabolic Disorder Labs: Lab Results  Component Value Date   HGBA1C 4.8 01/30/2021   No results found for: PROLACTIN Lab Results  Component Value Date   CHOL 171 04/14/2018   TRIG 140 04/14/2018   HDL 50 04/14/2018   CHOLHDL 3.4 04/14/2018   LDLCALC 93 04/14/2018   Lab Results  Component Value Date   TSH 0.737 08/30/2018    Therapeutic Level Labs: No results found for: LITHIUM No results found for: VALPROATE No components found for:  CBMZ  Current Medications: Current Outpatient Medications  Medication Sig Dispense Refill   [START ON 06/09/2021] Brexpiprazole (REXULTI) 0.5 MG TABS Take 1 tablet (0.5 mg total) by mouth every evening. 90 tablet 0   Galcanezumab-gnlm (EMGALITY) 120 MG/ML SOAJ INJECT 1 ML INTO THE SKIN EVERY 30 DAYS.     [START ON 06/22/2021] lamoTRIgine (LAMICTAL) 100 MG tablet Take 1 tablet (100 mg total) by mouth 2 (two) times  daily. 180 tablet 0   [START ON 06/16/2021] LORazepam (ATIVAN) 0.5 MG tablet Take 1 tablet (0.5 mg total) by mouth 2 (two) times daily as needed for anxiety. 60 tablet 2   ondansetron (ZOFRAN ODT) 4 MG disintegrating tablet Take 1 tablet (4 mg total) by mouth every 8 (eight) hours as needed for nausea or vomiting. 20 tablet 0   [START ON 06/10/2021] venlafaxine XR (EFFEXOR-XR) 75 MG 24 hr capsule Take 1  capsule (75 mg total) by mouth daily. 90 capsule 0   No current facility-administered medications for this visit.     Musculoskeletal: Strength & Muscle Tone:  N/A Gait & Station:  N/A Patient leans: N/A  Psychiatric Specialty Exam: Review of Systems  Psychiatric/Behavioral:  Positive for decreased concentration and dysphoric mood. Negative for agitation, behavioral problems, confusion, hallucinations, self-injury, sleep disturbance and suicidal ideas. The patient is nervous/anxious. The patient is not hyperactive.   All other systems reviewed and are negative.  There were no vitals taken for this visit.There is no height or weight on file to calculate BMI.  General Appearance: Fairly Groomed  Eye Contact:  Good  Speech:  Clear and Coherent  Volume:  Normal  Mood:   good  Affect:  Appropriate, Congruent, and calm  Thought Process:  Coherent  Orientation:  Full (Time, Place, and Person)  Thought Content: Logical   Suicidal Thoughts:  No  Homicidal Thoughts:  No  Memory:  Immediate;   Good  Judgement:  Good  Insight:  Good  Psychomotor Activity:  Normal  Concentration:  Concentration: Good and Attention Span: Good  Recall:  Good  Fund of Knowledge: Good  Language: Good  Akathisia:  No  Handed:  Right  AIMS (if indicated): not done  Assets:  Communication Skills Desire for Improvement  ADL's:  Intact  Cognition: WNL  Sleep:  Good   Screenings: GAD-7    Flowsheet Row Office Visit from 06/03/2021 in [facility] Office Visit from 01/30/2021 in [facility] Office Visit from 02/14/2020 in [facility]  Total GAD-7 Score 18 21 17       PHQ2-9    Flowsheet Row Video Visit from 06/04/2021 in [facility] Office Visit from 06/03/2021 in [facility] Office Visit from 01/30/2021 in [facility] Office Visit from 01/16/2021 in [facility] Video Visit from 12/22/2020 in [facility]  PHQ-2 Total Score 2 4 2 2 4   PHQ-9 Total Score 8 19 8 7 16       Flowsheet Row Video Visit from 06/04/2021 in [facility] ED from 05/19/2021 in [facility] EMERGENCY DEPARTMENT  C-SSRS RISK CATEGORY No Risk No Risk        Assessment and Plan:  [facility] is a 25 y.o. 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   1. GAD (generalized anxiety disorder) 2. MDD (major depressive disorder), recurrent episode, mild (HCC) She reports overall improvement in mood symptoms since the last visit. Psychosocial stressors includes pain, which she attributes to endometriosis, medical condition of myalgia, seizure-like episode.  She did not uptitrate venlafaxine as discussed due to concern of being on the higher dose of medication.  Although she will likely benefit from higher dose, will continue current dose of venlafaxine to target depression and anxiety at this time.  Will continue lamotrigine for mood dysregulation. Noted that this medication used to be prescribed by neurology, and there was reportedly no indication for seizure.  Will continue rexulti as adjunctive treatment for depression.  Will continue lorazepam as needed for anxiety.  Although this medication is preferable to be used only for short-term, we will continue at this time so that she  can function well/with the hope to taper off in the future.  There has been no  signs of misuse of this medication.  She will greatly benefit from CBT; provided psychoeducation about this.  She verbalized her understanding, although she would like to hold referral at this time.   I have utilized the [place] Controlled Substances Reporting System (PMP AWARxE) to confirm adherence regarding the patient's medication. My review reveals appropriate prescription fills.     Plan I have reviewed and updated plans as below (medication were sent for 90 days except lorazepam based on the request from the pharmacy) 1. Continue venlafaxine 75 mg daily  2. Continue Rexulti 0.5 mg daily  3. Continue lamotrigine 100 mg twice a day 4. Continue lorazepam 0.5 mg twice a day as needed for anxiety 5. Next appointment: 12/8 at 3 PM, video - on emgality monthly    This clinician has discussed the side effect associated with medication prescribed during this encounter. Please refer to notes in the previous encounters for more details.    Past trials of medication: sertraline, fluoxetine, Lexapro, duloxetine (headache, nightmares), elavil (hallucinations),  Rexulti, Abilify, Xanax, lamotrigine, temazepam   The patient demonstrates the following risk factors for suicide: Chronic risk factors for suicide include: psychiatric disorder of depression and chronic pain. Acute risk factors for suicide include: unemployment and social withdrawal/isolation. Protective factors for this patient include: positive social support, coping skills and hope for the future. Considering these factors, the overall suicide risk at this point appears to be low. Patient is appropriate for outpatient follow up.    Neysa Hotter, MD 06/04/2021, 3:35 PM

## 2021-06-03 NOTE — Progress Notes (Signed)
Subjective:  Patient ID: Samantha Dougherty, female    DOB: September 02, 1996, 25 y.o.   MRN: [id number]  Patient Care Team: Raliegh Ip, DO as PCP - General (Family Medicine) Jonelle Sidle, MD as PCP - Cardiology (Cardiology)   Chief Complaint:  hospital f/u (UTI, cardiac symptoms)   HPI: Samantha Dougherty is a 25 y.o. female presenting on 06/03/2021 for hospital f/u (UTI, cardiac symptoms)  Pt presents today for follow up after recent hospital admission. She was admitted to [facility] on 05/29/2021 for recurrent syncope, tachycardia, probable POTS, and UTI. CT chest with contrast, troponin, CBC, and CMP were unremarkable. EKG was abnormal, no image available for review, rate 87, QT 387 ms, PR 110 ms. Negative for influenza and COVID-19. UA positive for nitrites, negative for blood, no culture report available. She was given IV rocephin during hospital admission along with IV hydration. She followed up with nephrology yesterday and was started on Vit D repletion therapy. She is to follow up with cardiology but has not received an appointment. She states she continues to have slight dysuria. No flank pain, fever, chills, weakness, confusion, or syncope. She denies chest pain, palpitations, dyspnea, or orthopnea.   Relevant past medical, surgical, family, and social history reviewed and updated as indicated.  Allergies and medications reviewed and updated. Data reviewed: Chart in Epic.   Past Medical History:  Diagnosis Date  . Anti-phospholipid antibody syndrome (HCC)   . Anxiety   . Asthma   . Bipolar 2 disorder (HCC)   . Conversion disorder   . Depression   . DVT (deep venous thrombosis) (HCC)    LLE  . Fibromyalgia   . Gallbladder polyp   . H. pylori infection   . IBS (irritable bowel syndrome)   . Legal blindness    Right eye  . Migraine   . Mood disorder (HCC)   . Psychogenic nonepileptic seizure   . Urinary tract infection     Past Surgical History:  Procedure  Laterality Date  . COLONOSCOPY WITH ESOPHAGOGASTRODUODENOSCOPY (EGD)    . HYMENECTOMY  2016    Social History   Socioeconomic History  . Marital status: Single    Spouse name: Not on file  . Number of children: 0  . Years of education: Not on file  . Highest education level: Not on file  Occupational History  . Occupation: Consulting civil engineer  Tobacco Use  . Smoking status: Never  . Smokeless tobacco: Never  Vaping Use  . Vaping Use: Never used  Substance and Sexual Activity  . Alcohol use: Never  . Drug use: Never  . Sexual activity: Yes    Birth control/protection: None, Condom  Other Topics Concern  . Not on file  Social History Narrative   From [place].  Was receiving medical care at [facility].  Resides with mother.   Social Determinants of Health   Financial Resource Strain: Not on file  Food Insecurity: Not on file  Transportation Needs: Not on file  Physical Activity: Not on file  Stress: Not on file  Social Connections: Not on file  Intimate Partner Violence: Not on file    Outpatient Encounter Medications as of 06/03/2021  Medication Sig  . Brexpiprazole (REXULTI) 0.5 MG TABS Take 1 tablet (0.5 mg total) by mouth every evening.  . Galcanezumab-gnlm (EMGALITY) 120 MG/ML SOAJ INJECT 1 ML INTO THE SKIN EVERY 30 DAYS.  Marland Kitchen lamoTRIgine (LAMICTAL) 100 MG tablet Take 1 tablet (100 mg total) by  mouth 2 (two) times daily.  Marland Kitchen LORazepam (ATIVAN) 0.5 MG tablet Take 1 tablet (0.5 mg total) by mouth 2 (two) times daily as needed for anxiety.  . ondansetron (ZOFRAN ODT) 4 MG disintegrating tablet Take 1 tablet (4 mg total) by mouth every 8 (eight) hours as needed for nausea or vomiting.  . venlafaxine XR (EFFEXOR-XR) 37.5 MG 24 hr capsule 112.5 mg daily. Take along with 75 mg cap  . venlafaxine XR (EFFEXOR-XR) 75 MG 24 hr capsule 112.5 mg daily. Take along with 37.5 mg cap   No facility-administered encounter medications on file as of 06/03/2021.    Allergies  Allergen Reactions  . Sulfa  Antibiotics Anaphylaxis and Shortness Of Breath  . Amitriptyline Other (See Comments)    Hallucinations  . Maxalt [Rizatriptan] Hives    Review of Systems  Constitutional:  Negative for activity change, appetite change, chills, diaphoresis, fatigue, fever and unexpected weight change.  HENT: Negative.    Eyes: Negative.   Respiratory:  Negative for cough, chest tightness and shortness of breath.   Cardiovascular:  Negative for chest pain, palpitations and leg swelling.  Gastrointestinal:  Negative for abdominal pain, blood in stool, constipation, diarrhea, nausea and vomiting.  Endocrine: Negative.   Genitourinary:  Positive for dysuria. Negative for decreased urine volume, difficulty urinating, dyspareunia, enuresis, flank pain, frequency, genital sores, hematuria, menstrual problem, pelvic pain, urgency, vaginal bleeding, vaginal discharge and vaginal pain.  Musculoskeletal:  Negative for arthralgias and myalgias.  Skin: Negative.   Allergic/Immunologic: Negative.   Neurological:  Negative for dizziness and headaches.  Hematological: Negative.   Psychiatric/Behavioral:  Negative for confusion, hallucinations, sleep disturbance and suicidal ideas.   All other systems reviewed and are negative.      Objective:  BP 100/69   Pulse 75   Temp (!) 97.3 F (36.3 C)   Ht 5\' 3"  (1.6 m)   Wt 130 lb (59 kg)   SpO2 97%   BMI 23.03 kg/m    Wt Readings from Last 3 Encounters:  06/03/21 130 lb (59 kg)  05/19/21 128 lb (58.1 kg)  02/24/21 127 lb (57.6 kg)    Physical Exam Vitals and nursing note reviewed.  Constitutional:      General: She is not in acute distress.    Appearance: Normal appearance. She is well-developed, well-groomed and normal weight. She is not ill-appearing, toxic-appearing or diaphoretic.  HENT:     Head: Normocephalic and atraumatic.     Jaw: There is normal jaw occlusion.     Right Ear: Hearing normal.     Left Ear: Hearing normal.     Nose: Nose normal.      Mouth/Throat:     Lips: Pink.     Mouth: Mucous membranes are moist.     Pharynx: Oropharynx is clear. Uvula midline.  Eyes:     General: Lids are normal.     Extraocular Movements: Extraocular movements intact.     Conjunctiva/sclera: Conjunctivae normal.     Pupils: Pupils are equal, round, and reactive to light.  Neck:     Thyroid: No thyroid mass, thyromegaly or thyroid tenderness.     Vascular: No carotid bruit or JVD.     Trachea: Trachea and phonation normal.  Cardiovascular:     Rate and Rhythm: Normal rate and regular rhythm.     Chest Wall: PMI is not displaced.     Pulses: Normal pulses.     Heart sounds: Normal heart sounds. No murmur heard.   No  friction rub. No gallop.  Pulmonary:     Effort: Pulmonary effort is normal. No respiratory distress.     Breath sounds: Normal breath sounds. No wheezing.  Abdominal:     General: Bowel sounds are normal. There is no distension or abdominal bruit.     Palpations: Abdomen is soft. There is no hepatomegaly, splenomegaly or mass.     Tenderness: There is no abdominal tenderness. There is no right CVA tenderness, left CVA tenderness, guarding or rebound.     Hernia: No hernia is present.  Musculoskeletal:        General: Normal range of motion.     Cervical back: Normal range of motion and neck supple.     Right lower leg: No edema.     Left lower leg: No edema.  Lymphadenopathy:     Cervical: No cervical adenopathy.  Skin:    General: Skin is warm and dry.     Capillary Refill: Capillary refill takes less than 2 seconds.     Coloration: Skin is not cyanotic, jaundiced or pale.     Findings: No rash.  Neurological:     General: No focal deficit present.     Mental Status: She is alert and oriented to person, place, and time.     Cranial Nerves: Cranial nerves are intact.     Sensory: Sensation is intact.     Motor: Motor function is intact. No weakness.     Coordination: Coordination is intact.     Gait: Gait is  intact. Gait normal.     Deep Tendon Reflexes: Reflexes are normal and symmetric.  Psychiatric:        Attention and Perception: Attention and perception normal.        Mood and Affect: Mood and affect normal.        Speech: Speech normal.        Behavior: Behavior normal. Behavior is cooperative.        Thought Content: Thought content normal.        Cognition and Memory: Cognition and memory normal.        Judgment: Judgment normal.    Results for orders placed or performed during the hospital encounter of 05/19/21  CBC with Differential  Result Value Ref Range   WBC 8.8 4.0 - 10.5 K/uL   RBC 4.29 3.87 - 5.11 MIL/uL   Hemoglobin 13.3 12.0 - 15.0 g/dL   HCT 40.9 81.1 - 91.4 %   MCV 94.4 80.0 - 100.0 fL   MCH 31.0 26.0 - 34.0 pg   MCHC 32.8 30.0 - 36.0 g/dL   RDW 78.2 95.6 - 21.3 %   Platelets 247 150 - 400 K/uL   nRBC 0.0 0.0 - 0.2 %   Neutrophils Relative % 61 %   Neutro Abs 5.4 1.7 - 7.7 K/uL   Lymphocytes Relative 31 %   Lymphs Abs 2.7 0.7 - 4.0 K/uL   Monocytes Relative 6 %   Monocytes Absolute 0.5 0.1 - 1.0 K/uL   Eosinophils Relative 1 %   Eosinophils Absolute 0.1 0.0 - 0.5 K/uL   Basophils Relative 1 %   Basophils Absolute 0.0 0.0 - 0.1 K/uL   Immature Granulocytes 0 %   Abs Immature Granulocytes 0.03 0.00 - 0.07 K/uL  Comprehensive metabolic panel  Result Value Ref Range   Sodium 139 135 - 145 mmol/L   Potassium 3.4 (L) 3.5 - 5.1 mmol/L   Chloride 105 98 - 111 mmol/L   CO2  28 22 - 32 mmol/L   Glucose, Bld 102 (H) 70 - 99 mg/dL   BUN 7 6 - 20 mg/dL   Creatinine, Ser 9.75 0.44 - 1.00 mg/dL   Calcium 9.2 8.9 - 88.3 mg/dL   Total Protein 6.9 6.5 - 8.1 g/dL   Albumin 4.0 3.5 - 5.0 g/dL   AST 17 15 - 41 U/L   ALT 12 0 - 44 U/L   Alkaline Phosphatase 53 38 - 126 U/L   Total Bilirubin 0.4 0.3 - 1.2 mg/dL   GFR, Estimated >25 >49 mL/min   Anion gap 6 5 - 15  Lamotrigine level  Result Value Ref Range   Lamotrigine Lvl 3.5 2.0 - 20.0 ug/mL     Urinalysis in  office unremarkable.   Pertinent labs & imaging results that were available during my care of the patient were reviewed by me and considered in my medical decision making.  Assessment & Plan:  Samantha Dougherty was seen today for hospital f/u.  Diagnoses and all orders for this visit:  Hospital discharge follow up  Pt continues to have slight dysuria, no other symptoms.   Dysuria Urinalysis in office unremarkable. Culture pending, will treat if warranted. Symptomatic care discussed in detail.  -     Urinalysis, Complete -     Urine Culture  Postural dizziness with near syncope Tachycardia Tachycardia has resolved. She has yet to receive appointment with cardiology. Pt to call office if not received by end of week, will place referral then if required. Suspected POTS, discussed symptomatic care in detail. Increase fluid and salt intake, compression hose, and stay active.   Continue all other maintenance medications.  Follow up plan: Return if symptoms worsen or fail to improve.   Continue healthy lifestyle choices, including diet (rich in fruits, vegetables, and lean proteins, and low in salt and simple carbohydrates) and exercise (at least 30 minutes of moderate physical activity daily).  Educational handout given for POTS  The above assessment and management plan was discussed with the patient. The patient verbalized understanding of and has agreed to the management plan. Patient is aware to call the clinic if they develop any new symptoms or if symptoms persist or worsen. Patient is aware when to return to the clinic for a follow-up visit. Patient educated on when it is appropriate to go to the emergency department.   Kari Baars, FNP-C [facility] [phone]

## 2021-06-04 ENCOUNTER — Encounter: Payer: Self-pay | Admitting: Family Medicine

## 2021-06-04 ENCOUNTER — Encounter: Payer: Self-pay | Admitting: Psychiatry

## 2021-06-04 ENCOUNTER — Telehealth (INDEPENDENT_AMBULATORY_CARE_PROVIDER_SITE_OTHER): Payer: Medicare Other | Admitting: Psychiatry

## 2021-06-04 DIAGNOSIS — F330 Major depressive disorder, recurrent, mild: Secondary | ICD-10-CM | POA: Diagnosis not present

## 2021-06-04 DIAGNOSIS — F411 Generalized anxiety disorder: Secondary | ICD-10-CM | POA: Diagnosis not present

## 2021-06-04 MED ORDER — REXULTI 0.5 MG PO TABS: 1.0000 | ORAL_TABLET | Freq: Every evening | ORAL | 0 refills | Status: DC

## 2021-06-04 MED ORDER — LAMOTRIGINE 100 MG PO TABS: 100.0000 mg | ORAL_TABLET | Freq: Two times a day (BID) | ORAL | 0 refills | Status: DC

## 2021-06-04 MED ORDER — LORAZEPAM 0.5 MG PO TABS: 0.5000 mg | ORAL_TABLET | Freq: Two times a day (BID) | ORAL | 2 refills | Status: DC | PRN

## 2021-06-04 MED ORDER — VENLAFAXINE HCL ER 75 MG PO CP24: 75.0000 mg | ORAL_CAPSULE | Freq: Every day | ORAL | 0 refills | Status: DC

## 2021-06-04 NOTE — Patient Instructions (Signed)
1. Continue venlafaxine 75 mg daily  2. Continue Rexulti 0.5 mg daily  3. Continue lamotrigine 100 mg twice a day 4. Continue lorazepam 0.5 mg twice a day as needed for anxiety 5. Next appointment: 12/8 at 3 PM

## 2021-06-05 ENCOUNTER — Other Ambulatory Visit: Payer: Self-pay | Admitting: Family Medicine

## 2021-06-05 DIAGNOSIS — A770 Spotted fever due to Rickettsia rickettsii: Secondary | ICD-10-CM

## 2021-06-05 MED ORDER — DOXYCYCLINE HYCLATE 100 MG PO TABS: 100.0000 mg | ORAL_TABLET | Freq: Two times a day (BID) | ORAL | 0 refills | Status: AC

## 2021-06-05 NOTE — Telephone Encounter (Signed)
They have not released these results to [place], they may be trying to reach pt and start treatment. I can not see if this is IgG or IgM so can not tell if this is an acute infection.

## 2021-06-05 NOTE — Telephone Encounter (Signed)
This is the IgG which is if she was ever exposed. The IgM indicates an acute infection. I will send in doxycycline for 21 days, if you receive the IgM results, please send them. Thanks.

## 2021-06-07 LAB — URINE CULTURE

## 2021-06-10 NOTE — Telephone Encounter (Signed)
Can we see if we can get the rest of the results from [facility]?

## 2021-06-10 NOTE — Telephone Encounter (Signed)
She can have RMSF IgM drawn here to see if acute infection. If not, the doxy can be discontinued.

## 2021-06-11 NOTE — Telephone Encounter (Signed)
Closed

## 2021-07-10 ENCOUNTER — Ambulatory Visit (INDEPENDENT_AMBULATORY_CARE_PROVIDER_SITE_OTHER): Payer: Medicare Other | Admitting: Family Medicine

## 2021-07-10 ENCOUNTER — Encounter: Payer: Self-pay | Admitting: Family Medicine

## 2021-07-10 ENCOUNTER — Other Ambulatory Visit: Payer: Self-pay

## 2021-07-10 VITALS — BP 103/68 | HR 87 | Temp 98.4°F | Ht 63.0 in | Wt 131.6 lb

## 2021-07-10 DIAGNOSIS — R221 Localized swelling, mass and lump, neck: Secondary | ICD-10-CM | POA: Diagnosis not present

## 2021-07-10 NOTE — Progress Notes (Signed)
Subjective:  Patient ID: Samantha Dougherty, female    DOB: 01-Apr-1996, 25 y.o.   MRN: [id number]  Patient Care Team: Raliegh Ip, DO as PCP - General (Family Medicine) Jonelle Sidle, MD as PCP - Cardiology (Cardiology)   Chief Complaint:  throat pain   HPI: Samantha Dougherty is a 25 y.o. female presenting on 07/10/2021 for throat pain   Pt presents today with complaints of tenderness to her thyroid. States this started several days ago and does not seem to be improving. She states she has noted some swelling at times. No fever, chills, URI symptoms, fatigue, weight loss, or diaphoresis. Denies hper- or hypothyroidism symptoms.    Relevant past medical, surgical, family, and social history reviewed and updated as indicated.  Allergies and medications reviewed and updated. Data reviewed: Chart in Epic.   Past Medical History:  Diagnosis Date   Anti-phospholipid antibody syndrome (HCC)    Anxiety    Asthma    Bipolar 2 disorder (HCC)    Conversion disorder    Depression    DVT (deep venous thrombosis) (HCC)    LLE   Fibromyalgia    Gallbladder polyp    H. pylori infection    IBS (irritable bowel syndrome)    Legal blindness    Right eye   Migraine    Mood disorder (HCC)    Psychogenic nonepileptic seizure    Urinary tract infection     Past Surgical History:  Procedure Laterality Date   COLONOSCOPY WITH ESOPHAGOGASTRODUODENOSCOPY (EGD)     HYMENECTOMY  2016    Social History   Socioeconomic History   Marital status: Single    Spouse name: Not on file   Number of children: 0   Years of education: Not on file   Highest education level: Not on file  Occupational History   Occupation: student  Tobacco Use   Smoking status: Never   Smokeless tobacco: Never  Vaping Use   Vaping Use: Never used  Substance and Sexual Activity   Alcohol use: Never   Drug use: Never   Sexual activity: Yes    Birth control/protection: None, Condom  Other Topics Concern    Not on file  Social History Narrative   From [place].  Was receiving medical care at [facility].  Resides with mother.   Social Determinants of Health   Financial Resource Strain: Not on file  Food Insecurity: Not on file  Transportation Needs: Not on file  Physical Activity: Not on file  Stress: Not on file  Social Connections: Not on file  Intimate Partner Violence: Not on file    Outpatient Encounter Medications as of 07/10/2021  Medication Sig   acetaminophen (TYLENOL) 325 MG tablet Take by mouth.   Brexpiprazole (REXULTI) 0.5 MG TABS Take 1 tablet (0.5 mg total) by mouth every evening.   Cholecalciferol 25 MCG (1000 UT) capsule Take by mouth.   Docusate Sodium (DSS) 100 MG CAPS Take by mouth.   Ferrous Sulfate Dried (FERROUS SULFATE IRON PO) Take by mouth.   Galcanezumab-gnlm (EMGALITY) 120 MG/ML SOAJ INJECT 1 ML INTO THE SKIN EVERY 30 DAYS.   lamoTRIgine (LAMICTAL) 100 MG tablet Take 1 tablet (100 mg total) by mouth 2 (two) times daily.   LORazepam (ATIVAN) 0.5 MG tablet Take 1 tablet (0.5 mg total) by mouth 2 (two) times daily as needed for anxiety.   ondansetron (ZOFRAN ODT) 4 MG disintegrating tablet Take 1 tablet (4 mg total) by mouth  every 8 (eight) hours as needed for nausea or vomiting.   venlafaxine XR (EFFEXOR-XR) 75 MG 24 hr capsule Take 1 capsule (75 mg total) by mouth daily.   No facility-administered encounter medications on file as of 07/10/2021.    Allergies  Allergen Reactions   Sulfa Antibiotics Anaphylaxis and Shortness Of Breath   Amitriptyline Other (See Comments)    Hallucinations   Maxalt [Rizatriptan] Hives    Review of Systems  Constitutional:  Negative for activity change, appetite change, chills, diaphoresis, fatigue, fever and unexpected weight change.  HENT: Negative.  Negative for sore throat, trouble swallowing and voice change.   Eyes: Negative.   Respiratory:  Negative for cough, chest tightness and shortness of breath.   Cardiovascular:   Negative for chest pain, palpitations and leg swelling.  Gastrointestinal:  Negative for abdominal pain, blood in stool, constipation, diarrhea, nausea and vomiting.  Endocrine: Negative.  Negative for cold intolerance, heat intolerance, polydipsia, polyphagia and polyuria.  Genitourinary:  Negative for dysuria, frequency and urgency.  Musculoskeletal:  Positive for neck pain. Negative for arthralgias, back pain, gait problem, joint swelling, myalgias and neck stiffness.  Skin: Negative.   Allergic/Immunologic: Negative.   Neurological:  Negative for dizziness, tremors, seizures, syncope, facial asymmetry, speech difficulty, weakness, light-headedness, numbness and headaches.  Hematological: Negative.   Psychiatric/Behavioral:  Negative for confusion, hallucinations, sleep disturbance and suicidal ideas.   All other systems reviewed and are negative.      Objective:  BP 103/68   Pulse 87   Temp 98.4 F (36.9 C)   Ht 5\' 3"  (1.6 m)   Wt 131 lb 9.6 oz (59.7 kg)   SpO2 93%   BMI 23.31 kg/m    Wt Readings from Last 3 Encounters:  07/10/21 131 lb 9.6 oz (59.7 kg)  06/03/21 130 lb (59 kg)  05/19/21 128 lb (58.1 kg)    Physical Exam Vitals and nursing note reviewed.  Constitutional:      General: She is not in acute distress.    Appearance: Normal appearance. She is well-developed, well-groomed and normal weight. She is not ill-appearing, toxic-appearing or diaphoretic.  HENT:     Head: Normocephalic and atraumatic.     Jaw: There is normal jaw occlusion.     Right Ear: Hearing normal.     Left Ear: Hearing normal.     Nose: Nose normal.     Mouth/Throat:     Lips: Pink.     Mouth: Mucous membranes are moist.     Pharynx: Oropharynx is clear. Uvula midline.  Eyes:     General: Lids are normal.     Extraocular Movements: Extraocular movements intact.     Conjunctiva/sclera: Conjunctivae normal.     Pupils: Pupils are equal, round, and reactive to light.  Neck:     Thyroid:  Thyroid mass (small mass to right lateral thyroid region) and thyroid tenderness present. No thyromegaly.     Vascular: No carotid bruit or JVD.     Trachea: Trachea and phonation normal.  Cardiovascular:     Rate and Rhythm: Normal rate and regular rhythm.     Chest Wall: PMI is not displaced.     Pulses: Normal pulses.     Heart sounds: Normal heart sounds. No murmur heard.   No friction rub. No gallop.  Pulmonary:     Effort: Pulmonary effort is normal. No respiratory distress.     Breath sounds: Normal breath sounds. No wheezing.  Abdominal:     General:  Bowel sounds are normal. There is no distension or abdominal bruit.     Palpations: Abdomen is soft. There is no hepatomegaly or splenomegaly.     Tenderness: There is no abdominal tenderness. There is no right CVA tenderness or left CVA tenderness.     Hernia: No hernia is present.  Musculoskeletal:        General: Normal range of motion.     Cervical back: Normal range of motion and neck supple.     Right lower leg: No edema.     Left lower leg: No edema.  Lymphadenopathy:     Cervical: No cervical adenopathy.     Right cervical: No superficial, deep or posterior cervical adenopathy.    Left cervical: No superficial, deep or posterior cervical adenopathy.  Skin:    General: Skin is warm and dry.     Capillary Refill: Capillary refill takes less than 2 seconds.     Coloration: Skin is not cyanotic, jaundiced or pale.     Findings: No rash.  Neurological:     General: No focal deficit present.     Mental Status: She is alert and oriented to person, place, and time.     Sensory: Sensation is intact.     Motor: Motor function is intact.     Coordination: Coordination is intact.     Gait: Gait is intact.     Deep Tendon Reflexes: Reflexes are normal and symmetric.  Psychiatric:        Attention and Perception: Attention and perception normal.        Mood and Affect: Mood and affect normal.        Speech: Speech normal.         Behavior: Behavior normal. Behavior is cooperative.        Thought Content: Thought content normal.        Cognition and Memory: Cognition and memory normal.        Judgment: Judgment normal.    Results for orders placed or performed in visit on 06/03/21  Urine Culture   Specimen: Urine   UR  Result Value Ref Range   Urine Culture, Routine Final report    Organism ID, Bacteria Comment   Microscopic Examination   Urine  Result Value Ref Range   WBC, UA None seen 0 - 5 /hpf   RBC None seen 0 - 2 /hpf   Epithelial Cells (non renal) None seen 0 - 10 /hpf   Renal Epithel, UA None seen None seen /hpf   Bacteria, UA None seen None seen/Few  Urinalysis, Complete  Result Value Ref Range   Specific Gravity, UA 1.020 1.005 - 1.030   pH, UA 6.0 5.0 - 7.5   Color, UA Yellow Yellow   Appearance Ur Clear Clear   Leukocytes,UA Negative Negative   Protein,UA Negative Negative/Trace   Glucose, UA Negative Negative   Ketones, UA Negative Negative   RBC, UA Negative Negative   Bilirubin, UA Negative Negative   Urobilinogen, Ur 0.2 0.2 - 1.0 mg/dL   Nitrite, UA Negative Negative   Microscopic Examination See below:        Pertinent labs & imaging results that were available during my care of the patient were reviewed by me and considered in my medical decision making.  Assessment & Plan:  Samantha Dougherty was seen today for throat pain.  Diagnoses and all orders for this visit:  Mass of thyroid region Small tender nodule noted to lateral right thyroid. Will  obtain CBC and thyroid panel along with thyroid US. Pt asymptomatic. Pt to make follow up after [place] completed.  -     CBC with Differential/Platelet -     Thyroid Panel With TSH -     US THYROID; Future     Continue all other maintenance medications.  Follow up plan: Return if symptoms worsen or fail to improve. Make follow up once [place] has been completed.     The above assessment and management plan was discussed with the patient.  The patient verbalized understanding of and has agreed to the management plan. Patient is aware to call the clinic if they develop any new symptoms or if symptoms persist or worsen. Patient is aware when to return to the clinic for a follow-up visit. Patient educated on when it is appropriate to go to the emergency department.   Kari Baars, FNP-C [facility] [phone]

## 2021-07-11 LAB — CBC WITH DIFFERENTIAL/PLATELET
Basophils Absolute: 0 10*3/uL (ref 0.0–0.2)
Basos: 1 %
EOS (ABSOLUTE): 0.1 10*3/uL (ref 0.0–0.4)
Eos: 1 %
Hematocrit: 41.2 % (ref 34.0–46.6)
Hemoglobin: 14.2 g/dL (ref 11.1–15.9)
Immature Grans (Abs): 0 10*3/uL (ref 0.0–0.1)
Immature Granulocytes: 0 %
Lymphocytes Absolute: 1.8 10*3/uL (ref 0.7–3.1)
Lymphs: 28 %
MCH: 31.3 pg (ref 26.6–33.0)
MCHC: 34.5 g/dL (ref 31.5–35.7)
MCV: 91 fL (ref 79–97)
Monocytes Absolute: 0.4 10*3/uL (ref 0.1–0.9)
Monocytes: 6 %
Neutrophils Absolute: 4.1 10*3/uL (ref 1.4–7.0)
Neutrophils: 64 %
Platelets: 277 10*3/uL (ref 150–450)
RBC: 4.54 x10E6/uL (ref 3.77–5.28)
RDW: 11.9 % (ref 11.7–15.4)
WBC: 6.4 10*3/uL (ref 3.4–10.8)

## 2021-07-11 LAB — THYROID PANEL WITH TSH
Free Thyroxine Index: 1.7 (ref 1.2–4.9)
T3 Uptake Ratio: 25 % (ref 24–39)
T4, Total: 6.9 ug/dL (ref 4.5–12.0)
TSH: 0.794 u[IU]/mL (ref 0.450–4.500)

## 2021-07-17 ENCOUNTER — Ambulatory Visit (HOSPITAL_COMMUNITY): Payer: Medicare Other

## 2021-08-19 ENCOUNTER — Ambulatory Visit: Payer: Medicare Other

## 2021-08-24 NOTE — Progress Notes (Signed)
Virtual Visit via Video Note  I connected with Samantha Dougherty on 08/27/21 at  3:00 PM EST by a video enabled telemedicine application and verified that I am speaking with the correct person using two identifiers.  Location: Patient: home Provider: office Persons participated in the visit- patient, provider    I discussed the limitations of evaluation and management by telemedicine and the availability of in person appointments. The patient expressed understanding and agreed to proceed.   I discussed the assessment and treatment plan with the patient. The patient was provided an opportunity to ask questions and all were answered. The patient agreed with the plan and demonstrated an understanding of the instructions.   The patient was advised to call back or seek an in-person evaluation if the symptoms worsen or if the condition fails to improve as anticipated.  I provided 20 minutes of non-face-to-face time during this encounter.   Neysa Hotter, MD    [facility] MD/PA/NP OP Progress Note  08/27/2021 3:39 PM Samantha Dougherty  MRN:  [id number]  Chief Complaint:  Chief Complaint   Follow-up; Depression; Anxiety    HPI:  This is a follow-up appointment for depression and anxiety.  She states that she has been doing well.  She has a seizure a few times per week.  It lasts from 15 minutes to 1 hour.  It can happen in any situation.  She is planning to see another neurologist for further evaluation.  She also states that she continues to have pain.  She tends to feel more anxious when she has pain.  She has good support from her parents, who she feels comfortable with.  She has been trying to be positive despite this medical symptoms.  She occasionally finds it difficult to find friends in the area due to her medical symptoms.  She misses her family in [place].  She grew up there, and has close connection with them.  She is looking forward to visiting them in Christmas.  She has occasional depressed  mood and anhedonia.  She has insomnia, and will have sleep evaluation.  She feels fatigue.  She has good concentration.  She denies change in weight or appetite.  She denies SI.  She has occasional panic attacks.  She denies alcohol use or drug use.  She feels comfortable to stay on the current medication regimen.  She is open to see a therapist at this time.    Visit Diagnosis:    ICD-10-CM   1. GAD (generalized anxiety disorder)  F41.1     2. MDD (major depressive disorder), recurrent episode, mild (HCC)  F33.0       Past Psychiatric History: Please see initial evaluation for full details. I have reviewed the history. No updates at this time.     Past Medical History:  Past Medical History:  Diagnosis Date   Anti-phospholipid antibody syndrome (HCC)    Anxiety    Asthma    Bipolar 2 disorder (HCC)    Conversion disorder    Depression    DVT (deep venous thrombosis) (HCC)    LLE   Fibromyalgia    Gallbladder polyp    H. pylori infection    IBS (irritable bowel syndrome)    Legal blindness    Right eye   Migraine    Mood disorder (HCC)    Psychogenic nonepileptic seizure    Urinary tract infection     Past Surgical History:  Procedure Laterality Date   COLONOSCOPY WITH ESOPHAGOGASTRODUODENOSCOPY (EGD)  HYMENECTOMY  2016    Family Psychiatric History: Please see initial evaluation for full details. I have reviewed the history. No updates at this time.     Family History:  Family History  Problem Relation Age of Onset   Anxiety disorder Mother    Depression Mother    Hyperlipidemia Mother    Bipolar disorder Mother    Thyroid disease Maternal Grandmother    Deep vein thrombosis Maternal Grandmother    Thyroid disease Maternal Grandfather    Anxiety disorder Father    Depression Father    Autism Sister    Other Sister        [place] syndrome   Factor V Leiden deficiency Maternal Aunt    Breast cancer Maternal Aunt    Bipolar disorder Maternal Aunt     Diabetes Paternal Grandfather    Deep vein thrombosis Maternal Aunt    Bipolar disorder Maternal Aunt    Colon cancer Neg Hx    Esophageal cancer Neg Hx    Rectal cancer Neg Hx     Social History:  Social History   Socioeconomic History   Marital status: Single    Spouse name: Not on file   Number of children: 0   Years of education: Not on file   Highest education level: Not on file  Occupational History   Occupation: student  Tobacco Use   Smoking status: Never   Smokeless tobacco: Never  Vaping Use   Vaping Use: Never used  Substance and Sexual Activity   Alcohol use: Never   Drug use: Never   Sexual activity: Yes    Birth control/protection: None, Condom  Other Topics Concern   Not on file  Social History Narrative   From [place].  Was receiving medical care at [facility].  Resides with mother.   Social Determinants of Health   Financial Resource Strain: Not on file  Food Insecurity: Not on file  Transportation Needs: Not on file  Physical Activity: Not on file  Stress: Not on file  Social Connections: Not on file    Allergies:  Allergies  Allergen Reactions   Sulfa Antibiotics Anaphylaxis and Shortness Of Breath   Amitriptyline Other (See Comments)    Hallucinations   Maxalt [Rizatriptan] Hives    Metabolic Disorder Labs: Lab Results  Component Value Date   HGBA1C 4.8 01/30/2021   No results found for: PROLACTIN Lab Results  Component Value Date   CHOL 171 04/14/2018   TRIG 140 04/14/2018   HDL 50 04/14/2018   CHOLHDL 3.4 04/14/2018   LDLCALC 93 04/14/2018   Lab Results  Component Value Date   TSH 0.794 07/10/2021   TSH 0.737 08/30/2018    Therapeutic Level Labs: No results found for: LITHIUM No results found for: VALPROATE No components found for:  CBMZ  Current Medications: Current Outpatient Medications  Medication Sig Dispense Refill   acetaminophen (TYLENOL) 325 MG tablet Take by mouth.     [START ON 09/08/2021] Brexpiprazole  (REXULTI) 0.5 MG TABS Take 1 tablet (0.5 mg total) by mouth every evening. 90 tablet 1   Cholecalciferol 25 MCG (1000 UT) capsule Take by mouth.     Docusate Sodium (DSS) 100 MG CAPS Take by mouth.     Ferrous Sulfate Dried (FERROUS SULFATE IRON PO) Take by mouth.     Galcanezumab-gnlm (EMGALITY) 120 MG/ML SOAJ INJECT 1 ML INTO THE SKIN EVERY 30 DAYS.     [START ON 09/21/2021] lamoTRIgine (LAMICTAL) 100 MG tablet Take 1  tablet (100 mg total) by mouth 2 (two) times daily. 180 tablet 0   [START ON 10/08/2021] LORazepam (ATIVAN) 0.5 MG tablet Take 1 tablet (0.5 mg total) by mouth 2 (two) times daily as needed for anxiety. 60 tablet 1   ondansetron (ZOFRAN ODT) 4 MG disintegrating tablet Take 1 tablet (4 mg total) by mouth every 8 (eight) hours as needed for nausea or vomiting. 20 tablet 0   [START ON 09/09/2021] venlafaxine XR (EFFEXOR-XR) 75 MG 24 hr capsule Take 1 capsule (75 mg total) by mouth daily. 90 capsule 1   No current facility-administered medications for this visit.     Musculoskeletal: Strength & Muscle Tone:  N/A Gait & Station:  N/A Patient leans: N/A  Psychiatric Specialty Exam: Review of Systems  Psychiatric/Behavioral:  Positive for dysphoric mood and sleep disturbance. Negative for agitation, behavioral problems, confusion, decreased concentration, hallucinations, self-injury and suicidal ideas. The patient is nervous/anxious. The patient is not hyperactive.   All other systems reviewed and are negative.  There were no vitals taken for this visit.There is no height or weight on file to calculate BMI.  General Appearance: Fairly Groomed  Eye Contact:  Good  Speech:  Clear and Coherent  Volume:  Normal  Mood:  Anxious and Depressed  Affect:  Appropriate, Congruent, and euthymic  Thought Process:  Coherent  Orientation:  Full (Time, Place, and Person)  Thought Content: Logical   Suicidal Thoughts:  No  Homicidal Thoughts:  No  Memory:  Immediate;   Good  Judgement:  Good   Insight:  Good  Psychomotor Activity:  Normal  Concentration:  Concentration: Good and Attention Span: Good  Recall:  Good  Fund of Knowledge: Good  Language: Good  Akathisia:  No  Handed:  Right  AIMS (if indicated): not done  Assets:  Communication Skills Desire for Improvement  ADL's:  Intact  Cognition: WNL  Sleep:  Poor   Screenings: GAD-7    Flowsheet Row Office Visit from 07/10/2021 in [facility] Office Visit from 06/03/2021 in [facility] Office Visit from 01/30/2021 in [facility] Office Visit from 02/14/2020 in [facility]  Total GAD-7 Score 19 18 21 17       PHQ2-9    Flowsheet Row Office Visit from 07/10/2021 in 07/12/2021 [facility] Video Visit from 06/04/2021 in [facility] Office Visit from 06/03/2021 in [facility] Office Visit from 01/30/2021 in [facility] Office Visit from 01/16/2021 in 01/18/2021 Family Medicine  PHQ-2 Total Score 6 2 4 2 2   PHQ-9 Total Score 20 8 19 8 7       Flowsheet Row Video Visit from 06/04/2021 in [facility] ED from 05/19/2021 in [facility] EMERGENCY DEPARTMENT  C-SSRS RISK CATEGORY No Risk No Risk        Assessment and Plan:  [facility] is a 25 y.o. 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    1. GAD (generalized anxiety disorder) 2. MDD (major depressive disorder), recurrent episode, mild (HCC) Although she continues to struggle with anxiety, she reports overall improvement in depressive symptoms since the last visit. Psychosocial stressors includes missing her family in [facility], pain, which she attributes to endometriosis, medical condition of myalgia, seizure-like episode.   Although she may benefit from up titration of  venlafaxine, she feels comfortable to stay on the current medication regimen.  Will continue current dose of venlafaxine to target anxiety and depression.  Will continue lamotrigine for mood dysregulation.  Noted that this medication used to be prescribed by neurology, and there was reportedly no indication for seizure.  Will continue rexulti adjunctive treatment for depression.  Will continue lorazepam as needed for anxiety.  She will greatly benefit from CBT especially to work around somatic symptom including pain, seizure-like episode.  She is now open to this.  Will make referral.   I have utilized the [place] Controlled Substances Reporting System (PMP AWARxE) to confirm adherence regarding the patient's medication. My review reveals appropriate prescription fills.     Plan I have reviewed and updated plans as below (medication were sent for 90 days except lorazepam based on the request from the pharmacy) 1. Continue venlafaxine 75 mg daily  2. Continue Rexulti 0.5 mg daily  3. Continue lamotrigine 100 mg twice a day 4. Continue lorazepam 0.5 mg twice a day as needed for anxiety 5. Next appointment: 3/6 at 9:30 for 30 mins, in person - on emgality monthly    This clinician has discussed the side effect associated with medication prescribed during this encounter. Please refer to notes in the previous encounters for more details.    Past trials of medication: sertraline, fluoxetine, Lexapro, duloxetine (headache, nightmares), elavil (hallucinations),  Rexulti, Abilify, Xanax, lamotrigine, temazepam   The patient demonstrates the following risk factors for suicide: Chronic risk factors for suicide include: psychiatric disorder of depression and chronic pain. Acute risk factors for suicide include: unemployment and social withdrawal/isolation. Protective factors for this patient include: positive social support, coping skills and hope for the future.  Considering these factors, the overall suicide risk at this point appears to be low. Patient is appropriate for outpatient follow up.      Neysa Hotter, MD 08/27/2021, 3:39 PM

## 2021-08-26 ENCOUNTER — Ambulatory Visit: Payer: Medicare Other

## 2021-08-27 ENCOUNTER — Encounter: Payer: Self-pay | Admitting: Psychiatry

## 2021-08-27 ENCOUNTER — Other Ambulatory Visit: Payer: Self-pay

## 2021-08-27 ENCOUNTER — Telehealth (INDEPENDENT_AMBULATORY_CARE_PROVIDER_SITE_OTHER): Payer: Medicare Other | Admitting: Psychiatry

## 2021-08-27 DIAGNOSIS — F411 Generalized anxiety disorder: Secondary | ICD-10-CM | POA: Diagnosis not present

## 2021-08-27 DIAGNOSIS — F330 Major depressive disorder, recurrent, mild: Secondary | ICD-10-CM | POA: Diagnosis not present

## 2021-08-27 MED ORDER — LAMOTRIGINE 100 MG PO TABS: 100.0000 mg | ORAL_TABLET | Freq: Two times a day (BID) | ORAL | 0 refills | Status: AC

## 2021-08-27 MED ORDER — LORAZEPAM 0.5 MG PO TABS: 0.5000 mg | ORAL_TABLET | Freq: Two times a day (BID) | ORAL | 1 refills | Status: AC | PRN

## 2021-08-27 MED ORDER — VENLAFAXINE HCL ER 75 MG PO CP24: 75.0000 mg | ORAL_CAPSULE | Freq: Every day | ORAL | 1 refills | Status: AC

## 2021-08-27 MED ORDER — REXULTI 0.5 MG PO TABS: 1.0000 | ORAL_TABLET | Freq: Every evening | ORAL | 1 refills | Status: AC

## 2021-08-27 NOTE — Patient Instructions (Signed)
1. Continue venlafaxine 75 mg daily  2. Continue Rexulti 0.5 mg daily  3. Continue lamotrigine 100 mg twice a day 4. Continue lorazepam 0.5 mg twice a day as needed for anxiety 5. Next appointment: 3/6 at 9:30, in person  The next visit will be in person visit. Please arrive 15 mins before the scheduled time.   [facility]  Address: [address], [place], [place] [address]

## 2021-09-09 ENCOUNTER — Telehealth: Payer: Self-pay | Admitting: Family Medicine

## 2021-09-09 NOTE — Telephone Encounter (Signed)
°  Left message for patient to call back and schedule Medicare Annual Wellness Visit (AWV) to be completed by video or phone. ° °No hx of AWV  ° °Please schedule at anytime with [facility] Nurse Health Advisor --- Mary Jane ° °45 Minutes appointment  ° °Any questions, please call me at [phone]   °

## 2021-09-23 ENCOUNTER — Ambulatory Visit: Payer: Medicare Other | Admitting: Family Medicine

## 2021-09-23 ENCOUNTER — Encounter: Payer: Self-pay | Admitting: Family Medicine

## 2021-10-06 ENCOUNTER — Other Ambulatory Visit: Payer: Self-pay | Admitting: Family Medicine

## 2021-10-06 DIAGNOSIS — A770 Spotted fever due to Rickettsia rickettsii: Secondary | ICD-10-CM

## 2021-10-22 ENCOUNTER — Telehealth: Payer: Self-pay | Admitting: Family Medicine

## 2021-10-22 NOTE — Telephone Encounter (Signed)
°  Left message for patient to call back and schedule Medicare Annual Wellness Visit (AWV) to be completed by video or phone.  No hx of AWV eligible for AWVI as of 07/22/2019 per [employer]  Please schedule at anytime with [facility] Nurse Health Advisor --- Germaine Pomfret  45 Minutes appointment   Any questions, please call me at [phone]

## 2021-10-26 ENCOUNTER — Ambulatory Visit: Payer: Medicare Other | Admitting: Psychiatry

## 2021-11-18 NOTE — Progress Notes (Unsigned)
BH MD/PA/NP OP Progress Note  11/18/2021 3:30 PM Samantha Dougherty  MRN:  [id number]  Chief Complaint: No chief complaint on file.  HPI: *** Visit Diagnosis: No diagnosis found.  Past Psychiatric History: Please see initial evaluation for full details. I have reviewed the history. No updates at this time.     Past Medical History:  Past Medical History:  Diagnosis Date   Anti-phospholipid antibody syndrome (HCC)    Anxiety    Asthma    Bipolar 2 disorder (HCC)    Conversion disorder    Depression    DVT (deep venous thrombosis) (HCC)    LLE   Fibromyalgia    Gallbladder polyp    H. pylori infection    IBS (irritable bowel syndrome)    Legal blindness    Right eye   Migraine    Mood disorder (HCC)    Psychogenic nonepileptic seizure    Urinary tract infection     Past Surgical History:  Procedure Laterality Date   COLONOSCOPY WITH ESOPHAGOGASTRODUODENOSCOPY (EGD)     HYMENECTOMY  2016    Family Psychiatric History: Please see initial evaluation for full details. I have reviewed the history. No updates at this time.     Family History:  Family History  Problem Relation Age of Onset   Anxiety disorder Mother    Depression Mother    Hyperlipidemia Mother    Bipolar disorder Mother    Thyroid disease Maternal Grandmother    Deep vein thrombosis Maternal Grandmother    Thyroid disease Maternal Grandfather    Anxiety disorder Father    Depression Father    Autism Sister    Other Sister        [place] syndrome   Factor V Leiden deficiency Maternal Aunt    Breast cancer Maternal Aunt    Bipolar disorder Maternal Aunt    Diabetes Paternal Grandfather    Deep vein thrombosis Maternal Aunt    Bipolar disorder Maternal Aunt    Colon cancer Neg Hx    Esophageal cancer Neg Hx    Rectal cancer Neg Hx     Social History:  Social History   Socioeconomic History   Marital status: Single    Spouse name: Not on file   Number of children: 0   Years of education: Not on  file   Highest education level: Not on file  Occupational History   Occupation: student  Tobacco Use   Smoking status: Never   Smokeless tobacco: Never  Vaping Use   Vaping Use: Never used  Substance and Sexual Activity   Alcohol use: Never   Drug use: Never   Sexual activity: Yes    Birth control/protection: None, Condom  Other Topics Concern   Not on file  Social History Narrative   From [place].  Was receiving medical care at [facility].  Resides with mother.   Social Determinants of Health   Financial Resource Strain: Not on file  Food Insecurity: Not on file  Transportation Needs: Not on file  Physical Activity: Not on file  Stress: Not on file  Social Connections: Not on file    Allergies:  Allergies  Allergen Reactions   Sulfa Antibiotics Anaphylaxis and Shortness Of Breath   Amitriptyline Other (See Comments)    Hallucinations   Maxalt [Rizatriptan] Hives    Metabolic Disorder Labs: Lab Results  Component Value Date   HGBA1C 4.8 01/30/2021   No results found for: PROLACTIN Lab Results  Component Value Date  CHOL 171 04/14/2018   TRIG 140 04/14/2018   HDL 50 04/14/2018   CHOLHDL 3.4 04/14/2018   LDLCALC 93 04/14/2018   Lab Results  Component Value Date   TSH 0.794 07/10/2021   TSH 0.737 08/30/2018    Therapeutic Level Labs: No results found for: LITHIUM No results found for: VALPROATE No components found for:  CBMZ  Current Medications: Current Outpatient Medications  Medication Sig Dispense Refill   acetaminophen (TYLENOL) 325 MG tablet Take by mouth.     Brexpiprazole (REXULTI) 0.5 MG TABS Take 1 tablet (0.5 mg total) by mouth every evening. 90 tablet 1   Cholecalciferol 25 MCG (1000 UT) capsule Take by mouth.     Docusate Sodium (DSS) 100 MG CAPS Take by mouth.     Ferrous Sulfate Dried (FERROUS SULFATE IRON PO) Take by mouth.     Galcanezumab-gnlm (EMGALITY) 120 MG/ML SOAJ INJECT 1 ML INTO THE SKIN EVERY 30 DAYS.     lamoTRIgine (LAMICTAL)  100 MG tablet Take 1 tablet (100 mg total) by mouth 2 (two) times daily. 180 tablet 0   LORazepam (ATIVAN) 0.5 MG tablet Take 1 tablet (0.5 mg total) by mouth 2 (two) times daily as needed for anxiety. 60 tablet 1   ondansetron (ZOFRAN ODT) 4 MG disintegrating tablet Take 1 tablet (4 mg total) by mouth every 8 (eight) hours as needed for nausea or vomiting. 20 tablet 0   venlafaxine XR (EFFEXOR-XR) 75 MG 24 hr capsule Take 1 capsule (75 mg total) by mouth daily. 90 capsule 1   No current facility-administered medications for this visit.     Musculoskeletal: Strength & Muscle Tone:  N/A Gait & Station:  N/A Patient leans: N/A  Psychiatric Specialty Exam: Review of Systems  There were no vitals taken for this visit.There is no height or weight on file to calculate BMI.  General Appearance: {Appearance:22683}  Eye Contact:  {BHH EYE CONTACT:22684}  Speech:  Clear and Coherent  Volume:  Normal  Mood:  {BHH MOOD:22306}  Affect:  {Affect (PAA):22687}  Thought Process:  Coherent  Orientation:  Full (Time, Place, and Person)  Thought Content: Logical   Suicidal Thoughts:  {ST/HT (PAA):22692}  Homicidal Thoughts:  {ST/HT (PAA):22692}  Memory:  Immediate;   Good  Judgement:  {Judgement (PAA):22694}  Insight:  {Insight (PAA):22695}  Psychomotor Activity:  Normal  Concentration:  Concentration: Good and Attention Span: Good  Recall:  Good  Fund of Knowledge: Good  Language: Good  Akathisia:  No  Handed:  Right  AIMS (if indicated): not done  Assets:  Communication Skills Desire for Improvement  ADL's:  Intact  Cognition: WNL  Sleep:  {BHH GOOD/FAIR/POOR:22877}   Screenings: GAD-7    Flowsheet Row Office Visit from 07/10/2021 in [facility] Office Visit from 06/03/2021 in [facility] Office Visit from 01/30/2021 in [facility] Office Visit from 02/14/2020 in [facility]  Total GAD-7 Score 19  18 21 17       PHQ2-9    Flowsheet Row Office Visit from 07/10/2021 in [facility] Video Visit from 06/04/2021 in [facility] Office Visit from 06/03/2021 in [facility] Office Visit from 01/30/2021 in [facility] Office Visit from 01/16/2021 in [facility]  PHQ-2 Total Score 6 2 4 2 2   PHQ-9 Total Score 20 8 19 8 7       Flowsheet Row Video Visit from 06/04/2021 in [facility] ED from 05/19/2021 in [facility] EMERGENCY DEPARTMENT  C-SSRS RISK CATEGORY No Risk No Risk        Assessment and Plan:  [name] is a 26 y.o. 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     1. GAD (generalized anxiety disorder) 2. MDD (major depressive disorder), recurrent episode, mild (HCC) Although she continues to struggle with anxiety, she reports overall improvement in depressive symptoms since the last visit. Psychosocial stressors includes missing her family in [place], pain, which she attributes to endometriosis, medical condition of myalgia, seizure-like episode.  Although she may benefit from up titration of venlafaxine, she feels comfortable to stay on the current medication regimen.  Will continue current dose of venlafaxine to target anxiety and depression.  Will continue lamotrigine for mood dysregulation.  Noted that this medication used to be prescribed by neurology, and there was reportedly no indication for seizure.  Will continue rexulti adjunctive treatment for depression.  Will continue lorazepam as needed for anxiety.  She will greatly benefit from CBT especially to work around somatic symptom including pain, seizure-like episode.  She is now open to this.  Will make referral.     Plan I have reviewed and  updated plans as below (medication were sent for 90 days except lorazepam based on the request from the pharmacy) 1. Continue venlafaxine 75 mg daily  2. Continue Rexulti 0.5 mg daily  3. Continue lamotrigine 100 mg twice a day 4. Continue lorazepam 0.5 mg twice a day as needed for anxiety 5. Next appointment: 3/6 at 9:30 for 30 mins, in person - on emgality monthly    This clinician has discussed the side effect associated with medication prescribed during this encounter. Please refer to notes in the previous encounters for more details.    Past trials of medication: sertraline, fluoxetine, Lexapro, duloxetine (headache, nightmares), elavil (hallucinations),  Rexulti, Abilify, Xanax, lamotrigine, temazepam   The patient demonstrates the following risk factors for suicide: Chronic risk factors for suicide include: psychiatric disorder of depression and chronic pain. Acute risk factors for suicide include: unemployment and social withdrawal/isolation. Protective factors for this patient include: positive social support, coping skills and hope for the future. Considering these factors, the overall suicide risk at this point appears to be low. Patient is appropriate for outpatient follow up.      Collaboration of Care: Collaboration of Care: {BH OP Collaboration of Care:21014065}  Patient/Guardian was advised Release of Information must be obtained prior to any record release in order to collaborate their care with an outside provider. Patient/Guardian was advised if they have not already done so to contact the registration department to sign all necessary forms in order for [place] to release information regarding their care.   Consent: Patient/Guardian gives verbal consent for treatment and assignment of benefits for services provided during this visit. Patient/Guardian expressed understanding and agreed to proceed.    Neysa Hotter, MD 11/18/2021, 3:30 PM

## 2021-11-23 ENCOUNTER — Ambulatory Visit: Payer: Medicare Other | Admitting: Psychiatry

## 2021-11-23 ENCOUNTER — Telehealth: Payer: Self-pay | Admitting: Family Medicine

## 2021-11-23 NOTE — Telephone Encounter (Signed)
?  Left message for patient to call back and schedule Medicare Annual Wellness Visit (AWV) to be completed by video or phone. ? ?No hx of AWV eligible for AWVI per [employer] as of 07/22/2019 ? ?Please schedule at anytime with [facility] Nurse Health Advisor --- Mary Jane ? ?45 Minutes appointment  ? ?Any questions, please call me at [phone]   ?

## 2021-11-30 ENCOUNTER — Other Ambulatory Visit: Payer: Self-pay | Admitting: Psychiatry

## 2021-12-01 ENCOUNTER — Encounter: Payer: Self-pay | Admitting: Nurse Practitioner

## 2021-12-01 ENCOUNTER — Ambulatory Visit (INDEPENDENT_AMBULATORY_CARE_PROVIDER_SITE_OTHER): Payer: Medicare Other | Admitting: Nurse Practitioner

## 2021-12-01 DIAGNOSIS — N3000 Acute cystitis without hematuria: Secondary | ICD-10-CM | POA: Diagnosis not present

## 2021-12-01 LAB — MICROSCOPIC EXAMINATION
Epithelial Cells (non renal): NONE SEEN /hpf (ref 0–10)
RBC, Urine: >30 /hpf — AB (ref 0–2)
Renal Epithel, UA: NONE SEEN /hpf
WBC, UA: NONE SEEN /hpf (ref 0–5)

## 2021-12-01 LAB — URINALYSIS, COMPLETE
Bilirubin, UA: NEGATIVE
Glucose, UA: NEGATIVE
Leukocytes,UA: NEGATIVE
Nitrite, UA: POSITIVE — AB
Specific Gravity, UA: 1.02 (ref 1.005–1.030)
Urobilinogen, Ur: 1 mg/dL (ref 0.2–1.0)
pH, UA: 5 (ref 5.0–7.5)

## 2021-12-01 MED ORDER — AMOXICILLIN 875 MG PO TABS: 875.0000 mg | ORAL_TABLET | Freq: Two times a day (BID) | ORAL | 0 refills | Status: DC

## 2021-12-01 NOTE — Progress Notes (Signed)
? ?Virtual Visit  Note ?Due to COVID-19 pandemic this visit was conducted virtually. This visit type was conducted due to national recommendations for restrictions regarding the COVID-19 Pandemic (e.g. social distancing, sheltering in place) in an effort to limit this patient's exposure and mitigate transmission in our community. All issues noted in this document were discussed and addressed.  A physical exam was not performed with this format. ? ?I connected with Samantha Dougherty on 12/01/21 at 128 by telephone and verified that I am speaking with the correct person using two identifiers. Samantha Dougherty is currently located at home and  her mom is currently with her during visit. The provider, Mary-Margaret Daphine Deutscher, FNP is located in their office at time of visit. ? ?I discussed the limitations, risks, security and privacy concerns of performing an evaluation and management service by telephone and the availability of in person appointments. I also discussed with the patient that there may be a patient responsible charge related to this service. The patient expressed understanding and agreed to proceed. ? ? ?History and Present Illness: ? ?Urinary Tract Infection  ?This is a new problem. The current episode started in the past 7 days. The problem occurs intermittently. The problem has been unchanged. The quality of the pain is described as aching and burning. The pain is at a severity of 7/10. The pain is moderate. There has been no fever. She is Not sexually active. There is No history of pyelonephritis. Associated symptoms include frequency and urgency. Pertinent negatives include no flank pain or hematuria. Treatments tried: she has been on keflex and macrobid ove rth elast several months nad she is still symptomatic. Her past medical history is significant for recurrent UTIs.  ? ? ? ?Review of Systems  ?Genitourinary:  Positive for dysuria, frequency and urgency. Negative for flank pain and hematuria.   ? ? ?Observations/Objective: ?Alert and oriented- answers all questions appropriately ?No distress ?No suprapubic ? ?Assessment and Plan: ?Samantha Dougherty in today with chief complaint of Urinary Tract Infection ? ? ?1. Acute cystitis without hematuria ?Patient will bring urine in for culture and U/A ?Take medication as prescribe ?Cotton underwear ?Take shower not bath ?Cranberry juice, yogurt ?Force fluids ?AZO over the counter X2 days ?Culture pending ?RTO prn ?Has urology appointment next month- call urolgist and see if can be put on wait list for cancellation. ? ?Meds ordered this encounter  ?Medications  ? amoxicillin (AMOXIL) 875 MG tablet  ?  Sig: Take 1 tablet (875 mg total) by mouth 2 (two) times daily. 1 po BID  ?  Dispense:  14 tablet  ?  Refill:  0  ?  Order Specific Question:   Supervising Provider  ?  Answer:   Arville Care A [[id number]]  ? ? ? ? ?Follow Up Instructions: ?prn ? ?  ?I discussed the assessment and treatment plan with the patient. The patient was provided an opportunity to ask questions and all were answered. The patient agreed with the plan and demonstrated an understanding of the instructions. ?  ?The patient was advised to call back or seek an in-person evaluation if the symptoms worsen or if the condition fails to improve as anticipated. ? ?The above assessment and management plan was discussed with the patient. The patient verbalized understanding of and has agreed to the management plan. Patient is aware to call the clinic if symptoms persist or worsen. Patient is aware when to return to the clinic for a follow-up visit. Patient educated on  when it is appropriate to go to the emergency department.  ? ?Time call ended:  1:40 ? ?I provided 12 minutes of  non face-to-face time during this encounter. ? ? ? ?Mary-Margaret Daphine Deutscher, FNP ? ? ?

## 2021-12-03 LAB — URINE CULTURE

## 2021-12-08 ENCOUNTER — Encounter: Payer: Self-pay | Admitting: Family Medicine

## 2021-12-08 ENCOUNTER — Other Ambulatory Visit: Payer: Self-pay | Admitting: Family Medicine

## 2021-12-08 DIAGNOSIS — G43909 Migraine, unspecified, not intractable, without status migrainosus: Secondary | ICD-10-CM

## 2021-12-08 MED ORDER — EMGALITY 120 MG/ML ~~LOC~~ SOAJ: SUBCUTANEOUS | 3 refills | Status: AC

## 2022-01-19 ENCOUNTER — Telehealth: Payer: Self-pay | Admitting: Family Medicine

## 2022-01-19 NOTE — Telephone Encounter (Signed)
?  Left message for patient to call back and schedule Medicare Annual Wellness Visit (AWV) to be completed by video or phone. ? ?No hx of AWV eligible for AWVI per [employer] as of 07/22/2019 ? ?Please schedule at anytime with [facility] Nurse Health Advisor --- Mary Jane ? ?45 Minutes appointment  ? ?Any questions, please call me at [phone]   ?

## 2022-02-08 ENCOUNTER — Ambulatory Visit (INDEPENDENT_AMBULATORY_CARE_PROVIDER_SITE_OTHER): Payer: Medicare Other | Admitting: Family Medicine

## 2022-02-08 DIAGNOSIS — N390 Urinary tract infection, site not specified: Secondary | ICD-10-CM

## 2022-02-08 LAB — MICROSCOPIC EXAMINATION
RBC, Urine: NONE SEEN /hpf (ref 0–2)
Renal Epithel, UA: NONE SEEN /hpf
WBC, UA: NONE SEEN /hpf (ref 0–5)

## 2022-02-08 LAB — URINALYSIS, ROUTINE W REFLEX MICROSCOPIC
Specific Gravity, UA: 1.015 (ref 1.005–1.030)
pH, UA: 8.5 — ABNORMAL HIGH (ref 5.0–7.5)

## 2022-02-08 MED ORDER — AMPICILLIN 500 MG PO CAPS: 500.0000 mg | ORAL_CAPSULE | Freq: Four times a day (QID) | ORAL | 0 refills | Status: AC

## 2022-02-08 NOTE — Progress Notes (Signed)
Telephone visit  Subjective: CC:UTI PCP: Raliegh Ip, DO RSW:NIOE D. Singley is a 26 y.o. female calls for telephone consult today. Patient provides verbal consent for consult held via phone.  Due to COVID-19 pandemic this visit was conducted virtually. This visit type was conducted due to national recommendations for restrictions regarding the COVID-19 Pandemic (e.g. social distancing, sheltering in place) in an effort to limit this patient's exposure and mitigate transmission in our community. All issues noted in this document were discussed and addressed.  A physical exam was not performed with this format.   Location of patient: home Location of provider: [facility] Others present for call: none  1. Urinary symptoms Patient reports a 2 day history of dysuria, urinary frequency, urgency, pelvic pressure. Upper back pain slightly worse than baseline.  No hematuria, fevers, chills, abdominal pain, nausea, vomiting, vaginal discharge.  Patient has used Ampicillin for past UTIs.  Patient has a h/o frequent or recurrent UTIs.  She has a procedure with her urologist to look internally to evaluate the reason for recurrent bladder pain/ UTIs.     ROS: Per HPI  Allergies  Allergen Reactions   Sulfa Antibiotics Anaphylaxis and Shortness Of Breath   Amitriptyline Other (See Comments)    Hallucinations   Maxalt [Rizatriptan] Hives   Past Medical History:  Diagnosis Date   Anti-phospholipid antibody syndrome (HCC)    Anxiety    Asthma    Bipolar 2 disorder (HCC)    Conversion disorder    Depression    DVT (deep venous thrombosis) (HCC)    LLE   Fibromyalgia    Gallbladder polyp    H. pylori infection    IBS (irritable bowel syndrome)    Legal blindness    Right eye   Migraine    Mood disorder (HCC)    Psychogenic nonepileptic seizure    Urinary tract infection     Current Outpatient Medications:    acetaminophen (TYLENOL) 325 MG tablet, Take by mouth., Disp: , Rfl:     amoxicillin (AMOXIL) 875 MG tablet, Take 1 tablet (875 mg total) by mouth 2 (two) times daily. 1 po BID, Disp: 14 tablet, Rfl: 0   Brexpiprazole (REXULTI) 0.5 MG TABS, Take 1 tablet (0.5 mg total) by mouth every evening., Disp: 90 tablet, Rfl: 1   Cholecalciferol 25 MCG (1000 UT) capsule, Take by mouth., Disp: , Rfl:    Docusate Sodium (DSS) 100 MG CAPS, Take by mouth., Disp: , Rfl:    Ferrous Sulfate Dried (FERROUS SULFATE IRON PO), Take by mouth., Disp: , Rfl:    Galcanezumab-gnlm (EMGALITY) 120 MG/ML SOAJ, INJECT 1 ML INTO THE SKIN EVERY 30 DAYS., Disp: 1.12 mL, Rfl: 3   lamoTRIgine (LAMICTAL) 100 MG tablet, Take 1 tablet (100 mg total) by mouth 2 (two) times daily., Disp: 180 tablet, Rfl: 0   ondansetron (ZOFRAN ODT) 4 MG disintegrating tablet, Take 1 tablet (4 mg total) by mouth every 8 (eight) hours as needed for nausea or vomiting., Disp: 20 tablet, Rfl: 0   venlafaxine XR (EFFEXOR-XR) 75 MG 24 hr capsule, Take 1 capsule (75 mg total) by mouth daily., Disp: 90 capsule, Rfl: 1  Assessment/ Plan: 26 y.o. female   Recurrent UTI - Plan: Urine Culture, Urinalysis, Routine w reflex microscopic, ampicillin (PRINCIPEN) 500 MG capsule  Ampicillin 500 mg every 6 hours for 7 days sent given recurrent UTI.  Her urinalysis certainly looked very infected and this will be sent for urine culture.  We will contact her with  any further recommendations pending this result.  We will CC today's note to her urologist as Lorain Childes  Start time: 1:16pm (no answer); 1:23pm; 2:52pm(called back with results and A/P) End time: 1:28pm; 2:54pm (call officially ended)  Total time spent on patient care (including telephone call/ virtual visit): 7 minutes  Lillian Tigges Hulen Skains, DO [facility] [phone]

## 2022-02-10 ENCOUNTER — Telehealth: Payer: Self-pay | Admitting: Family Medicine

## 2022-02-10 DIAGNOSIS — R112 Nausea with vomiting, unspecified: Secondary | ICD-10-CM

## 2022-02-10 LAB — URINE CULTURE

## 2022-02-10 NOTE — Telephone Encounter (Signed)
CMp and CBC w/ diff is all I know to even consider.  She may need eval in ER if she is getting worse as she may need IVFs

## 2022-02-10 NOTE — Telephone Encounter (Signed)
Any labs we can order/ suggestions?

## 2022-02-10 NOTE — Telephone Encounter (Signed)
Samantha Dougherty is aware and states she has been throwing up and will see how she feels tomorrow if she needs to go to er or not. Also states she will get the labs. I ordered cbc and cmp

## 2022-02-25 ENCOUNTER — Other Ambulatory Visit: Payer: Self-pay | Admitting: Psychiatry

## 2022-03-09 ENCOUNTER — Ambulatory Visit: Payer: Medicare Other | Admitting: Obstetrics & Gynecology

## 2022-03-09 ENCOUNTER — Encounter: Payer: Self-pay | Admitting: Obstetrics & Gynecology

## 2022-03-16 ENCOUNTER — Encounter: Payer: Self-pay | Admitting: Family Medicine

## 2022-03-16 ENCOUNTER — Ambulatory Visit (INDEPENDENT_AMBULATORY_CARE_PROVIDER_SITE_OTHER): Payer: Medicare Other | Admitting: Family Medicine

## 2022-03-16 DIAGNOSIS — F445 Conversion disorder with seizures or convulsions: Secondary | ICD-10-CM

## 2022-04-01 ENCOUNTER — Ambulatory Visit (INDEPENDENT_AMBULATORY_CARE_PROVIDER_SITE_OTHER): Payer: Medicare Other | Admitting: Obstetrics & Gynecology

## 2022-04-01 ENCOUNTER — Other Ambulatory Visit (HOSPITAL_COMMUNITY)
Admission: RE | Admit: 2022-04-01 | Discharge: 2022-04-01 | Disposition: A | Discharge status: Home / Self Care | Payer: Medicare Other | Source: Ambulatory Visit | Attending: Obstetrics & Gynecology | Admitting: Obstetrics & Gynecology

## 2022-04-01 ENCOUNTER — Encounter: Payer: Self-pay | Admitting: Obstetrics & Gynecology

## 2022-04-01 VITALS — BP 111/78 | HR 71 | Ht 63.0 in | Wt 146.0 lb

## 2022-04-01 DIAGNOSIS — Z1151 Encounter for screening for human papillomavirus (HPV): Secondary | ICD-10-CM | POA: Diagnosis not present

## 2022-04-01 DIAGNOSIS — Z01419 Encounter for gynecological examination (general) (routine) without abnormal findings: Secondary | ICD-10-CM | POA: Insufficient documentation

## 2022-04-01 DIAGNOSIS — N809 Endometriosis, unspecified: Secondary | ICD-10-CM | POA: Diagnosis not present

## 2022-04-01 MED ORDER — MEGESTROL ACETATE 40 MG PO TABS: 40.0000 mg | ORAL_TABLET | Freq: Every day | ORAL | 11 refills | Status: AC

## 2022-04-01 NOTE — Progress Notes (Signed)
Subjective:     Samantha Dougherty is a 26 y.o. female here for a routine exam.  Patient's last menstrual period was 03/18/2022 (exact date). [id number] Birth Control Method:  none Menstrual Calendar(currently): generally regular, but a little irregular last month  Current complaints: endometriosis, reviewed her op notes and office note from [employer].   Current acute medical issues:  conversion endometriosis   Recent Gynecologic History Patient's last menstrual period was 03/18/2022 (exact date). Last Pap: 2020,  normal Last mammogram: ,    Past Medical History:  Diagnosis Date   Anti-phospholipid antibody syndrome (HCC)    Anxiety    Asthma    Bipolar 2 disorder (HCC)    Conversion disorder    Depression    DVT (deep venous thrombosis) (HCC)    LLE   Fibromyalgia    Gallbladder polyp    H. pylori infection    IBS (irritable bowel syndrome)    Legal blindness    Right eye   Migraine    Mood disorder (HCC)    Psychogenic nonepileptic seizure    Urinary tract infection     Past Surgical History:  Procedure Laterality Date   COLONOSCOPY WITH ESOPHAGOGASTRODUODENOSCOPY (EGD)     HYMENECTOMY  2016    OB History     Gravida  0   Para  0   Term  0   Preterm  0   AB  0   Living  0      SAB  0   IAB  0   Ectopic  0   Multiple  0   Live Births  0           Social History   Socioeconomic History   Marital status: Single    Spouse name: Not on file   Number of children: 0   Years of education: Not on file   Highest education level: Not on file  Occupational History   Occupation: student  Tobacco Use   Smoking status: Never   Smokeless tobacco: Never  Vaping Use   Vaping Use: Never used  Substance and Sexual Activity   Alcohol use: Never   Drug use: Never   Sexual activity: Never    Birth control/protection: None  Other Topics Concern   Not on file  Social History Narrative   From [place].  Was receiving medical care at [facility].  Resides with  mother.   Social Determinants of Health   Financial Resource Strain: Unknown (04/01/2022)   Overall Financial Resource Strain (CARDIA)    Difficulty of Paying Living Expenses: Patient refused  Food Insecurity: No Food Insecurity (04/01/2022)   Hunger Vital Sign    Worried About Running Out of Food in the Last Year: Never true    Ran Out of Food in the Last Year: Never true  Transportation Needs: No Transportation Needs (04/01/2022)   PRAPARE - Administrator, Civil Service (Medical): No    Lack of Transportation (Non-Medical): No  Physical Activity: Insufficiently Active (04/01/2022)   Exercise Vital Sign    Days of Exercise per Week: 1 day    Minutes of Exercise per Session: 10 min  Stress: Stress Concern Present (04/01/2022)   [employer] of Occupational Health - Occupational Stress Questionnaire    Feeling of Stress : Very much  Social Connections: Socially Isolated (04/01/2022)   Social Connection and Isolation Panel [NHANES]    Frequency of Communication with Friends and Family: Once a week  Frequency of Social Gatherings with Friends and Family: Once a week    Attends Religious Services: 1 to 4 times per year    Active Member of [employer] or Organizations: No    Attends Engineer, structural: Never    Marital Status: Never married    Family History  Problem Relation Age of Onset   Anxiety disorder Mother    Depression Mother    Hyperlipidemia Mother    Bipolar disorder Mother    Thyroid disease Maternal Grandmother    Deep vein thrombosis Maternal Grandmother    Thyroid disease Maternal Grandfather    Anxiety disorder Father    Depression Father    Autism Sister    Other Sister        [place] syndrome   Factor V Leiden deficiency Maternal Aunt    Breast cancer Maternal Aunt    Bipolar disorder Maternal Aunt    Diabetes Paternal Grandfather    Deep vein thrombosis Maternal Aunt    Bipolar disorder Maternal Aunt    Colon cancer Neg Hx     Esophageal cancer Neg Hx    Rectal cancer Neg Hx      Current Outpatient Medications:    acetaminophen (TYLENOL) 325 MG tablet, Take by mouth., Disp: , Rfl:    Brexpiprazole 0.5 MG TABS, Take by mouth., Disp: , Rfl:    Cholecalciferol 25 MCG (1000 UT) capsule, Take by mouth., Disp: , Rfl:    Docusate Sodium (DSS) 100 MG CAPS, Take by mouth., Disp: , Rfl:    Ferrous Sulfate Dried (FERROUS SULFATE IRON PO), Take by mouth., Disp: , Rfl:    Galcanezumab-gnlm (EMGALITY) 120 MG/ML SOAJ, INJECT 1 ML INTO THE SKIN EVERY 30 DAYS., Disp: 1.12 mL, Rfl: 3   lamoTRIgine (LAMICTAL) 100 MG tablet, Take 1 tablet (100 mg total) by mouth 2 (two) times daily., Disp: 180 tablet, Rfl: 0   LORazepam (ATIVAN) 0.5 MG tablet, Take 0.5 mg by mouth 2 (two) times daily as needed., Disp: , Rfl:    megestrol (MEGACE) 40 MG tablet, Take 1 tablet (40 mg total) by mouth daily., Disp: 30 tablet, Rfl: 11   ondansetron (ZOFRAN ODT) 4 MG disintegrating tablet, Take 1 tablet (4 mg total) by mouth every 8 (eight) hours as needed for nausea or vomiting., Disp: 20 tablet, Rfl: 0   venlafaxine XR (EFFEXOR-XR) 75 MG 24 hr capsule, Take 1 capsule (75 mg total) by mouth daily., Disp: 90 capsule, Rfl: 1  Review of Systems  Review of Systems  Constitutional: Negative for fever, chills, weight loss, malaise/fatigue and diaphoresis.  HENT: Negative for hearing loss, ear pain, nosebleeds, congestion, sore throat, neck pain, tinnitus and ear discharge.   Eyes: Negative for blurred vision, double vision, photophobia, pain, discharge and redness.  Respiratory: Negative for cough, hemoptysis, sputum production, shortness of breath, wheezing and stridor.   Cardiovascular: Negative for chest pain, palpitations, orthopnea, claudication, leg swelling and PND.  Gastrointestinal: negative for abdominal pain. Negative for heartburn, nausea, vomiting, diarrhea, constipation, blood in stool and melena.  Genitourinary: Negative for dysuria, urgency,  frequency, hematuria and flank pain.  Musculoskeletal: Negative for myalgias, back pain, joint pain and falls.  Skin: Negative for itching and rash.  Neurological: Negative for dizziness, tingling, tremors, sensory change, speech change, focal weakness, seizures, loss of consciousness, weakness and headaches.  Endo/Heme/Allergies: Negative for environmental allergies and polydipsia. Does not bruise/bleed easily.  Psychiatric/Behavioral: Negative for depression, suicidal ideas, hallucinations, memory loss and substance abuse. The patient is not  nervous/anxious and does not have insomnia.        Objective:  Blood pressure 111/78, pulse 71, height 5\' 3"  (1.6 m), weight 146 lb (66.2 kg), last menstrual period 03/18/2022.   Physical Exam  Vitals reviewed. Constitutional: She is oriented to person, place, and time. She appears well-developed and well-nourished.  HENT:  Head: Normocephalic and atraumatic.        Right Ear: External ear normal.  Left Ear: External ear normal.  Nose: Nose normal.  Mouth/Throat: Oropharynx is clear and moist.  Eyes: Conjunctivae and EOM are normal. Pupils are equal, round, and reactive to light. Right eye exhibits no discharge. Left eye exhibits no discharge. No scleral icterus.  Neck: Normal range of motion. Neck supple. No tracheal deviation present. No thyromegaly present.  Cardiovascular: Normal rate, regular rhythm, normal heart sounds and intact distal pulses.  Exam reveals no gallop and no friction rub.   No murmur heard. Respiratory: Effort normal and breath sounds normal. No respiratory distress. She has no wheezes. She has no rales. She exhibits no tenderness.  GI: Soft. Bowel sounds are normal. She exhibits no distension and no mass. There is no tenderness. There is no rebound and no guarding.  Genitourinary:  Breasts no masses skin changes or nipple changes bilaterally      Vulva is normal without lesions Vagina is pink moist without discharge Cervix  normal in appearance and pap is done Uterus is normal size shape and contour Adnexa is negative with normal sized ovaries   Musculoskeletal: Normal range of motion. She exhibits no edema and no tenderness.  Neurological: She is alert and oriented to person, place, and time. She has normal reflexes. She displays normal reflexes. No cranial nerve deficit. She exhibits normal muscle tone. Coordination normal.  Skin: Skin is warm and dry. No rash noted. No erythema. No pallor.  Psychiatric: She has a normal mood and affect. Her behavior is normal. Judgment and thought content normal.       Medications Ordered at today's visit: Meds ordered this encounter  Medications   megestrol (MEGACE) 40 MG tablet    Sig: Take 1 tablet (40 mg total) by mouth daily.    Dispense:  30 tablet    Refill:  11    Other orders placed at today's visit: No orders of the defined types were placed in this encounter.     Assessment:    Normal Gyn exam.    Plan:    Hormone replacement therapy: megestrol therapy. Follow up in: 1 year.    Trial megestrol, 03/20/2022 has been exhausting management Return in about 1 year (around 04/02/2023) for Follow up, with Dr 04/04/2023.

## 2022-04-03 ENCOUNTER — Other Ambulatory Visit: Payer: Self-pay | Admitting: Psychiatry

## 2022-04-05 ENCOUNTER — Telehealth: Payer: Self-pay | Admitting: Family Medicine

## 2022-04-05 NOTE — Telephone Encounter (Signed)
I spoke to mother and advised she can call pharmacy and have them transfer the Emg[facility] to [facility] and pt's mom voiced understanding.

## 2022-04-07 LAB — CYTOLOGY - PAP
Comment: NEGATIVE
Diagnosis: UNDETERMINED — AB
High risk HPV: NEGATIVE

## 2022-07-28 ENCOUNTER — Encounter: Payer: Self-pay | Admitting: Family Medicine

## 2022-07-28 ENCOUNTER — Telehealth (INDEPENDENT_AMBULATORY_CARE_PROVIDER_SITE_OTHER): Payer: Medicare Other | Admitting: Family Medicine

## 2022-07-28 ENCOUNTER — Encounter: Payer: Self-pay | Admitting: Cardiology

## 2022-07-28 DIAGNOSIS — M2550 Pain in unspecified joint: Secondary | ICD-10-CM | POA: Diagnosis not present

## 2022-07-28 DIAGNOSIS — Z8269 Family history of other diseases of the musculoskeletal system and connective tissue: Secondary | ICD-10-CM

## 2022-07-28 DIAGNOSIS — M353 Polymyalgia rheumatica: Secondary | ICD-10-CM

## 2022-07-28 DIAGNOSIS — Z820 Family history of epilepsy and other diseases of the nervous system: Secondary | ICD-10-CM | POA: Insufficient documentation

## 2022-07-28 DIAGNOSIS — E538 Deficiency of other specified B group vitamins: Secondary | ICD-10-CM

## 2022-07-28 DIAGNOSIS — Z8261 Family history of arthritis: Secondary | ICD-10-CM | POA: Diagnosis not present

## 2022-07-28 DIAGNOSIS — E559 Vitamin D deficiency, unspecified: Secondary | ICD-10-CM

## 2022-07-28 NOTE — Progress Notes (Signed)
MyChart Video visit  Subjective: [id number] concern PCP: Raliegh Ip, DO Samantha Dougherty is a 26 y.o. female. Patient provides verbal consent for consult held via video.  Due to COVID-19 pandemic this visit was conducted virtually. This visit type was conducted due to national recommendations for restrictions regarding the COVID-19 Pandemic (e.g. social distancing, sheltering in place) in an effort to limit this patient's exposure and mitigate transmission in our community. All issues noted in this document were discussed and addressed.  A physical exam was not performed with this format.   Location of patient: home Location of provider: [facility] Others present for call: mom  1. Muscle and joint pain She reports polyarthralgia and polymyalgia.  She has burning in her hands/ feet and it will become discolored.  No overt joint swelling or warmth reported.  She notes that the muscle pain feels like she has flulike symptoms.  Symptoms have been more prevalent over the last few weeks but she's had some level of pain for a while now.  There is a family history significant for rheumatoid arthritis, multiple sclerosis and polymyalgia rheumatica.  ROS: Per HPI  Allergies  Allergen Reactions   Sulfa Antibiotics Anaphylaxis and Shortness Of Breath   Amitriptyline Other (See Comments)    Hallucinations   Maxalt [Rizatriptan] Hives   Past Medical History:  Diagnosis Date   Anti-phospholipid antibody syndrome (HCC)    Anxiety    Asthma    Bipolar 2 disorder (HCC)    Conversion disorder    Depression    DVT (deep venous thrombosis) (HCC)    LLE   Fibromyalgia    Gallbladder polyp    H. pylori infection    IBS (irritable bowel syndrome)    Legal blindness    Right eye   Migraine    Mood disorder (HCC)    Psychogenic nonepileptic seizure    Urinary tract infection     Current Outpatient Medications:    acetaminophen (TYLENOL) 325 MG tablet, Take by mouth., Disp: , Rfl:     Brexpiprazole 0.5 MG TABS, Take by mouth., Disp: , Rfl:    Cholecalciferol 25 MCG (1000 UT) capsule, Take by mouth., Disp: , Rfl:    Docusate Sodium (DSS) 100 MG CAPS, Take by mouth., Disp: , Rfl:    Ferrous Sulfate Dried (FERROUS SULFATE IRON PO), Take by mouth., Disp: , Rfl:    Galcanezumab-gnlm (EMGALITY) 120 MG/ML SOAJ, INJECT 1 ML INTO THE SKIN EVERY 30 DAYS., Disp: 1.12 mL, Rfl: 3   lamoTRIgine (LAMICTAL) 100 MG tablet, Take 1 tablet (100 mg total) by mouth 2 (two) times daily., Disp: 180 tablet, Rfl: 0   LORazepam (ATIVAN) 0.5 MG tablet, Take 0.5 mg by mouth 2 (two) times daily as needed., Disp: , Rfl:    megestrol (MEGACE) 40 MG tablet, Take 1 tablet (40 mg total) by mouth daily., Disp: 30 tablet, Rfl: 11   ondansetron (ZOFRAN ODT) 4 MG disintegrating tablet, Take 1 tablet (4 mg total) by mouth every 8 (eight) hours as needed for nausea or vomiting., Disp: 20 tablet, Rfl: 0   venlafaxine XR (EFFEXOR-XR) 75 MG 24 hr capsule, Take 1 capsule (75 mg total) by mouth daily., Disp: 90 capsule, Rfl: 1  Gen: Nontoxic female Pulm: Normal work of breathing on room air  Assessment/ Plan: 26 y.o. female   Polyarthralgia - Plan: ANA w/Reflex if Positive, C-reactive protein, Sedimentation rate, Rheumatoid factor, CBC with Differential, Magnesium, CK  Polymyalgia (HCC) - Plan: ANA w/Reflex if Positive, C-reactive  protein, Sedimentation rate, Rheumatoid factor, CBC with Differential, Magnesium  Family history of polymyalgia rheumatica - Plan: ANA w/Reflex if Positive, C-reactive protein, Sedimentation rate, Rheumatoid factor, CBC with Differential  Family history of rheumatoid arthritis - Plan: ANA w/Reflex if Positive, C-reactive protein, Sedimentation rate, Rheumatoid factor, CBC with Differential  Family history of MS (multiple sclerosis)  Vitamin B12 deficiency - Plan: Vitamin B12  Vitamin D deficiency - Plan: VITAMIN D 25 Hydroxy (Vit-D Deficiency, Fractures)  We will evaluate for  autoimmune component.  There was some mention of MS but I do not know that she fits an MS picture at this time.  She had quite a bit of imaging in the past and nothing has been identified to suggest this thus far.  We will hold off on any rheumatologic referral pending these autoimmune screening labs.  Regards to the discoloration in the hands and feet it sounds like she may have Raynaud's phenomenon going on.  This was not directly observed during today's visit but clinically that fits  Start time: 1:02pm End time: 1:14pm  Total time spent on patient care (including video visit/ documentation): 11 minutes  Aidah Forquer Hulen Skains, DO [facility] [phone]

## 2022-07-30 ENCOUNTER — Other Ambulatory Visit: Payer: Medicare Other

## 2022-07-30 DIAGNOSIS — E559 Vitamin D deficiency, unspecified: Secondary | ICD-10-CM

## 2022-07-30 DIAGNOSIS — Z8261 Family history of arthritis: Secondary | ICD-10-CM

## 2022-07-30 DIAGNOSIS — Z8269 Family history of other diseases of the musculoskeletal system and connective tissue: Secondary | ICD-10-CM

## 2022-07-30 DIAGNOSIS — M353 Polymyalgia rheumatica: Secondary | ICD-10-CM

## 2022-07-30 DIAGNOSIS — M2550 Pain in unspecified joint: Secondary | ICD-10-CM

## 2022-07-30 DIAGNOSIS — E538 Deficiency of other specified B group vitamins: Secondary | ICD-10-CM

## 2022-07-31 LAB — RHEUMATOID FACTOR: Rheumatoid fact SerPl-aCnc: <10 IU/mL (ref <14.0)

## 2022-07-31 LAB — VITAMIN B12: Vitamin B-12: 291 pg/mL (ref 232–1245)

## 2022-07-31 LAB — CBC WITH DIFFERENTIAL/PLATELET
Basophils Absolute: 0 10*3/uL (ref 0.0–0.2)
Basos: 0 %
EOS (ABSOLUTE): 0 10*3/uL (ref 0.0–0.4)
Eos: 0 %
Hematocrit: 41.9 % (ref 34.0–46.6)
Hemoglobin: 14.1 g/dL (ref 11.1–15.9)
Immature Grans (Abs): 0 10*3/uL (ref 0.0–0.1)
Immature Granulocytes: 0 %
Lymphocytes Absolute: 1.8 10*3/uL (ref 0.7–3.1)
Lymphs: 27 %
MCH: 29.7 pg (ref 26.6–33.0)
MCHC: 33.7 g/dL (ref 31.5–35.7)
MCV: 88 fL (ref 79–97)
Monocytes Absolute: 0.4 10*3/uL (ref 0.1–0.9)
Monocytes: 5 %
Neutrophils Absolute: 4.5 10*3/uL (ref 1.4–7.0)
Neutrophils: 68 %
Platelets: 273 10*3/uL (ref 150–450)
RBC: 4.75 x10E6/uL (ref 3.77–5.28)
RDW: 12.2 % (ref 11.7–15.4)
WBC: 6.8 10*3/uL (ref 3.4–10.8)

## 2022-07-31 LAB — MAGNESIUM: Magnesium: 2 mg/dL (ref 1.6–2.3)

## 2022-07-31 LAB — VITAMIN D 25 HYDROXY (VIT D DEFICIENCY, FRACTURES): Vit D, 25-Hydroxy: 22.4 ng/mL — ABNORMAL LOW (ref 30.0–100.0)

## 2022-07-31 LAB — C-REACTIVE PROTEIN: CRP: 2 mg/L (ref 0–10)

## 2022-07-31 LAB — ANA W/REFLEX IF POSITIVE: Anti Nuclear Antibody (ANA): NEGATIVE

## 2022-07-31 LAB — CK: Total CK: 30 U/L — ABNORMAL LOW (ref 32–182)

## 2022-07-31 LAB — SEDIMENTATION RATE: Sed Rate: 15 mm/hr (ref 0–32)

## 2022-08-04 ENCOUNTER — Encounter: Payer: Self-pay | Admitting: Family Medicine

## 2022-09-09 ENCOUNTER — Encounter: Payer: Self-pay | Admitting: Family Medicine

## 2022-09-10 ENCOUNTER — Telehealth: Payer: Self-pay

## 2022-09-10 NOTE — Telephone Encounter (Signed)
Transition Care Management Unsuccessful Follow-up Telephone Call  Date of discharge and from where:  09/08/22 [facility] ED  Attempts:  1st Attempt  Reason for unsuccessful TCM follow-up call:  Left voice message

## 2022-09-17 NOTE — Telephone Encounter (Signed)
Transition Care Management Follow-up Telephone Call Date of discharge and from where: 09/08/22 [facility] ED How have you been since you were released from the hospital? Patient states that she is doing better Any questions or concerns? No  Items Reviewed: Did the pt receive and understand the discharge instructions provided? Yes  Medications obtained and verified? Yes  Other? No  Any new allergies since your discharge? No  Dietary orders reviewed? Yes Do you have support at home? Yes   Home Care and Equipment/Supplies: Were home health services ordered? not applicable If so, what is the name of the agency? na  Has the agency set up a time to come to the patient's home? not applicable Were any new equipment or medical supplies ordered?  No What is the name of the medical supply agency? na Were you able to get the supplies/equipment? not applicable Do you have any questions related to the use of the equipment or supplies? No  Functional Questionnaire: (I = Independent and D = Dependent) ADLs: i  Bathing/Dressing- i  Meal Prep- i  Eating- i  Maintaining continence- i  Transferring/Ambulation- i  Managing Meds- i  Follow up appointments reviewed:  PCP Hospital f/u appt confirmed? No   Specialist Hospital f/u appt confirmed? No    Are transportation arrangements needed? No  If their condition worsens, is the pt aware to call PCP or go to the Emergency Dept.? Yes Was the patient provided with contact information for the PCP's office or ED? Yes Was to pt encouraged to call back with questions or concerns? Yes

## 2022-10-22 ENCOUNTER — Ambulatory Visit: Payer: Medicare Other

## 2022-11-18 ENCOUNTER — Encounter: Payer: Self-pay | Admitting: Radiology

## 2022-12-07 ENCOUNTER — Encounter: Payer: Self-pay | Admitting: Family Medicine

## 2022-12-07 ENCOUNTER — Ambulatory Visit (INDEPENDENT_AMBULATORY_CARE_PROVIDER_SITE_OTHER): Payer: Medicare Other | Admitting: Family Medicine

## 2022-12-07 VITALS — BP 105/74 | HR 92 | Temp 98.3°F | Ht 63.0 in | Wt 153.0 lb

## 2022-12-07 DIAGNOSIS — E538 Deficiency of other specified B group vitamins: Secondary | ICD-10-CM | POA: Diagnosis not present

## 2022-12-07 DIAGNOSIS — R790 Abnormal level of blood mineral: Secondary | ICD-10-CM

## 2022-12-07 DIAGNOSIS — R5383 Other fatigue: Secondary | ICD-10-CM

## 2022-12-07 DIAGNOSIS — R5381 Other malaise: Secondary | ICD-10-CM

## 2022-12-07 DIAGNOSIS — E559 Vitamin D deficiency, unspecified: Secondary | ICD-10-CM

## 2022-12-07 NOTE — Progress Notes (Signed)
Subjective:  Patient ID: Samantha Dougherty, female    DOB: 01-24-1996, 27 y.o.   MRN: [id number]  Patient Care Team: Janora Norlander, DO as PCP - General (Family Medicine)   Chief Complaint:  Fatigue (Patient states it has been ongoing but has recently gotten worse. )   HPI: Samantha Dougherty is a 27 y.o. female presenting on 12/07/2022 for Fatigue (Patient states it has been ongoing but has recently gotten worse. )   Fatigue: Patient complains of fatigue. Symptoms began several months ago. Symptoms of her fatigue have been change in appetite, general malaise, and hypersomnolence. Patient describes the following psychologic symptoms: none.  Patient denies fever, significant change in weight, symptoms of arthritis, exercise intolerance, unusual rashes, cold intolerance, constipation and change in hair texture., GI blood loss, and witnessed or suspected sleep apnea. Symptoms have gradually worsened. Severity has been symptoms bothersome, but easily able to carry out all usual work/school/family activities. Previous visits for this problem: none. She was on vitamin B 12 and vitamin D repletion therapy but reports she quit taking them due to nausea.  Denies recent illnesses. Denies excessive caffeine intake. States she is sleeping more than usual. Does have heavy menses and is not on iron repletion therapy.     Relevant past medical, surgical, family, and social history reviewed and updated as indicated.  Allergies and medications reviewed and updated. Data reviewed: Chart in Epic.   Past Medical History:  Diagnosis Date   Anti-phospholipid antibody syndrome (HCC)    Anxiety    Asthma    Bipolar 2 disorder (HCC)    Conversion disorder    Depression    DVT (deep venous thrombosis) (HCC)    LLE   Fibromyalgia    Gallbladder polyp    H. pylori infection    IBS (irritable bowel syndrome)    Legal blindness    Right eye   Migraine    Mood disorder ([place])    Psychogenic nonepileptic seizure     Urinary tract infection     Past Surgical History:  Procedure Laterality Date   COLONOSCOPY WITH ESOPHAGOGASTRODUODENOSCOPY (EGD)     HYMENECTOMY  2016    Social History   Socioeconomic History   Marital status: Single    Spouse name: Not on file   Number of children: 0   Years of education: Not on file   Highest education level: Not on file  Occupational History   Occupation: student  Tobacco Use   Smoking status: Never   Smokeless tobacco: Never  Vaping Use   Vaping Use: Never used  Substance and Sexual Activity   Alcohol use: Never   Drug use: Never   Sexual activity: Never    Birth control/protection: None  Other Topics Concern   Not on file  Social History Narrative   From [place].  Was receiving medical care at [facility].  Resides with mother.   Social Determinants of Health   Financial Resource Strain: Patient Declined (04/01/2022)   Overall Financial Resource Strain (CARDIA)    Difficulty of Paying Living Expenses: Patient declined  Food Insecurity: No Food Insecurity (04/01/2022)   Hunger Vital Sign    Worried About Running Out of Food in the Last Year: Never true    Ran Out of Food in the Last Year: Never true  Transportation Needs: No Transportation Needs (04/01/2022)   PRAPARE - Hydrologist (Medical): No    Lack of Transportation (Non-Medical): No  Physical Activity: Insufficiently Active (04/01/2022)   Exercise Vital Sign    Days of Exercise per Week: 1 day    Minutes of Exercise per Session: 10 min  Stress: Stress Concern Present (04/01/2022)   [place]    Feeling of Stress : Very much  Social Connections: Socially Isolated (04/01/2022)   Social Connection and Isolation Panel [NHANES]    Frequency of Communication with Friends and Family: Once a week    Frequency of Social Gatherings with Friends and Family: Once a week    Attends Religious Services: 1 to 4  times per year    Active Member of [employer] or Organizations: No    Attends Archivist Meetings: Never    Marital Status: Never married  Intimate Partner Violence: Not At Risk (04/01/2022)   Humiliation, Afraid, Rape, and Kick questionnaire    Fear of Current or Ex-Partner: No    Emotionally Abused: No    Physically Abused: No    Sexually Abused: No    Outpatient Encounter Medications as of 12/07/2022  Medication Sig   acetaminophen (TYLENOL) 325 MG tablet Take by mouth.   Brexpiprazole 0.5 MG TABS Take by mouth.   Cholecalciferol 25 MCG (1000 UT) capsule Take by mouth.   Docusate Sodium (DSS) 100 MG CAPS Take by mouth.   Ferrous Sulfate Dried (FERROUS SULFATE IRON PO) Take by mouth.   Galcanezumab-gnlm (EMGALITY) 120 MG/ML SOAJ INJECT 1 ML INTO THE SKIN EVERY 30 DAYS.   lamoTRIgine (LAMICTAL) 100 MG tablet Take 1 tablet (100 mg total) by mouth 2 (two) times daily.   LORazepam (ATIVAN) 0.5 MG tablet Take 0.5 mg by mouth 2 (two) times daily as needed.   megestrol (MEGACE) 40 MG tablet Take 1 tablet (40 mg total) by mouth daily.   ondansetron (ZOFRAN ODT) 4 MG disintegrating tablet Take 1 tablet (4 mg total) by mouth every 8 (eight) hours as needed for nausea or vomiting.   venlafaxine XR (EFFEXOR-XR) 75 MG 24 hr capsule Take 1 capsule (75 mg total) by mouth daily.   No facility-administered encounter medications on file as of 12/07/2022.    Allergies  Allergen Reactions   Sulfa Antibiotics Anaphylaxis and Shortness Of Breath   Amitriptyline Other (See Comments)    Hallucinations   Cephalexin Nausea And Vomiting   Maxalt [Rizatriptan] Hives   Minocycline Nausea And Vomiting    Review of Systems  Constitutional:  Positive for activity change, appetite change and fatigue. Negative for chills, diaphoresis, fever and unexpected weight change.  HENT: Negative.    Eyes: Negative.  Negative for photophobia and visual disturbance.  Respiratory:  Negative for cough, chest  tightness and shortness of breath.   Cardiovascular:  Negative for chest pain, palpitations and leg swelling.  Gastrointestinal:  Negative for abdominal pain, blood in stool, constipation, diarrhea, nausea and vomiting.  Endocrine: Negative.   Genitourinary:  Negative for decreased urine volume, difficulty urinating, dysuria, frequency and urgency.  Musculoskeletal:  Negative for arthralgias and myalgias.  Skin: Negative.   Allergic/Immunologic: Negative.   Neurological:  Negative for dizziness, tremors, seizures, syncope, facial asymmetry, speech difficulty, weakness, light-headedness, numbness and headaches.  Hematological: Negative.   Psychiatric/Behavioral:  Negative for agitation, behavioral problems, confusion, decreased concentration, dysphoric mood, hallucinations, self-injury, sleep disturbance and suicidal ideas. The patient is not nervous/anxious and is not hyperactive.   All other systems reviewed and are negative.       Objective:  BP 105/74  Pulse 92   Temp 98.3 F (36.8 C) (Temporal)   Ht 5\' 3"  (1.6 m)   Wt 153 lb (69.4 kg)   LMP 11/28/2022   SpO2 95%   BMI 27.10 kg/m    Wt Readings from Last 3 Encounters:  12/07/22 153 lb (69.4 kg)  04/01/22 146 lb (66.2 kg)  07/10/21 131 lb 9.6 oz (59.7 kg)    Physical Exam Vitals and nursing note reviewed.  Constitutional:      General: She is not in acute distress.    Appearance: Normal appearance. She is well-developed, well-groomed and overweight. She is not ill-appearing, toxic-appearing or diaphoretic.  HENT:     Head: Normocephalic and atraumatic.     Jaw: There is normal jaw occlusion.     Right Ear: Hearing normal.     Left Ear: Hearing normal.     Nose: Nose normal.     Mouth/Throat:     Lips: Pink.     Mouth: Mucous membranes are moist.     Pharynx: Oropharynx is clear. Uvula midline.  Eyes:     General: Lids are normal.     Conjunctiva/sclera: Conjunctivae normal.     Pupils: Pupils are equal, round,  and reactive to light.  Neck:     Thyroid: No thyroid mass, thyromegaly or thyroid tenderness.     Vascular: No carotid bruit or JVD.     Trachea: Trachea and phonation normal.  Cardiovascular:     Rate and Rhythm: Normal rate and regular rhythm.     Chest Wall: PMI is not displaced.     Pulses: Normal pulses.     Heart sounds: Normal heart sounds. No murmur heard.    No friction rub. No gallop.  Pulmonary:     Effort: Pulmonary effort is normal. No respiratory distress.     Breath sounds: Normal breath sounds. No wheezing.  Abdominal:     General: Bowel sounds are normal. There is no distension or abdominal bruit.     Palpations: Abdomen is soft. There is no hepatomegaly or splenomegaly.     Tenderness: There is no abdominal tenderness. There is no right CVA tenderness or left CVA tenderness.     Hernia: No hernia is present.  Musculoskeletal:     Cervical back: Normal range of motion and neck supple.     Right lower leg: No edema.     Left lower leg: No edema.  Lymphadenopathy:     Cervical: No cervical adenopathy.  Skin:    General: Skin is warm and dry.     Capillary Refill: Capillary refill takes less than 2 seconds.     Coloration: Skin is not cyanotic, jaundiced or pale.     Findings: No rash.  Neurological:     General: No focal deficit present.     Mental Status: She is alert and oriented to person, place, and time.     Sensory: Sensation is intact.     Motor: Motor function is intact.     Coordination: Coordination is intact.     Gait: Gait is intact.     Deep Tendon Reflexes: Reflexes are normal and symmetric.  Psychiatric:        Attention and Perception: Attention and perception normal.        Mood and Affect: Mood and affect normal.        Speech: Speech normal.        Behavior: Behavior normal. Behavior is cooperative.  Thought Content: Thought content normal.        Cognition and Memory: Cognition and memory normal.        Judgment: Judgment normal.      Results for orders placed or performed in visit on 07/30/22  Sedimentation rate  Result Value Ref Range   Sed Rate 15 0 - 32 mm/hr  ANA w/Reflex if Positive  Result Value Ref Range   Anti Nuclear Antibody (ANA) Negative Negative  C-reactive protein  Result Value Ref Range   CRP 2 0 - 10 mg/L  Rheumatoid factor  Result Value Ref Range   Rhuematoid fact SerPl-aCnc <10.0 <14.0 IU/mL  CBC with Differential  Result Value Ref Range   WBC 6.8 3.4 - 10.8 x10E3/uL   RBC 4.75 3.77 - 5.28 x10E6/uL   Hemoglobin 14.1 11.1 - 15.9 g/dL   Hematocrit 41.9 34.0 - 46.6 %   MCV 88 79 - 97 fL   MCH 29.7 26.6 - 33.0 pg   MCHC 33.7 31.5 - 35.7 g/dL   RDW 12.2 11.7 - 15.4 %   Platelets 273 150 - 450 x10E3/uL   Neutrophils 68 Not Estab. %   Lymphs 27 Not Estab. %   Monocytes 5 Not Estab. %   Eos 0 Not Estab. %   Basos 0 Not Estab. %   Neutrophils Absolute 4.5 1.4 - 7.0 x10E3/uL   Lymphocytes Absolute 1.8 0.7 - 3.1 x10E3/uL   Monocytes Absolute 0.4 0.1 - 0.9 x10E3/uL   EOS (ABSOLUTE) 0.0 0.0 - 0.4 x10E3/uL   Basophils Absolute 0.0 0.0 - 0.2 x10E3/uL   Immature Granulocytes 0 Not Estab. %   Immature Grans (Abs) 0.0 0.0 - 0.1 x10E3/uL  Magnesium  Result Value Ref Range   Magnesium 2.0 1.6 - 2.3 mg/dL  CK  Result Value Ref Range   Total CK 30 (L) 32 - 182 U/L  Vitamin B12  Result Value Ref Range   Vitamin B-12 291 232 - 1,245 pg/mL  VITAMIN D 25 Hydroxy (Vit-D Deficiency, Fractures)  Result Value Ref Range   Vit D, 25-Hydroxy 22.4 (L) 30.0 - 100.0 ng/mL       Pertinent labs & imaging results that were available during my care of the patient were reviewed by me and considered in my medical decision making.  Assessment & Plan:  Demaya was seen today for fatigue.  Diagnoses and all orders for this visit:  Malaise and fatigue Vitamin B12 deficiency Vitamin D deficiency Low ferritin Ongoing fatigue and malaise. Sleep hygiene discussed in detail. Will check for potential anemia,  electrolyte disturbances, thyroid dysfunction, or vitamin D deficiencies. Further treatment pending results.  -     Anemia Profile B -     CMP14+EGFR -     VITAMIN D 25 Hydroxy (Vit-D Deficiency, Fractures)     Continue all other maintenance medications.  Follow up plan: Aware to make follow up with PCP for CPE.   Continue healthy lifestyle choices, including diet (rich in fruits, vegetables, and lean proteins, and low in salt and simple carbohydrates) and exercise (at least 30 minutes of moderate physical activity daily).  Educational handout given for fatigue.   The above assessment and management plan was discussed with the patient. The patient verbalized understanding of and has agreed to the management plan. Patient is aware to call the clinic if they develop any new symptoms or if symptoms persist or worsen. Patient is aware when to return to the clinic for a follow-up visit. Patient educated on  when it is appropriate to go to the emergency department.   Monia Pouch, FNP-C [facility] [phone]

## 2022-12-08 ENCOUNTER — Encounter: Payer: Self-pay | Admitting: Family Medicine

## 2022-12-08 LAB — CMP14+EGFR
ALT: 11 IU/L (ref 0–32)
AST: 14 IU/L (ref 0–40)
Albumin/Globulin Ratio: 1.8 (ref 1.2–2.2)
Albumin: 4.8 g/dL (ref 4.0–5.0)
Alkaline Phosphatase: 117 IU/L (ref 44–121)
BUN/Creatinine Ratio: 9 (ref 9–23)
BUN: 7 mg/dL (ref 6–20)
Bilirubin Total: 0.4 mg/dL (ref 0.0–1.2)
CO2: 23 mmol/L (ref 20–29)
Calcium: 10.2 mg/dL (ref 8.7–10.2)
Chloride: 102 mmol/L (ref 96–106)
Creatinine, Ser: 0.8 mg/dL (ref 0.57–1.00)
Globulin, Total: 2.6 g/dL (ref 1.5–4.5)
Glucose: 106 mg/dL — ABNORMAL HIGH (ref 70–99)
Potassium: 4.3 mmol/L (ref 3.5–5.2)
Sodium: 142 mmol/L (ref 134–144)
Total Protein: 7.4 g/dL (ref 6.0–8.5)
eGFR: 105 mL/min/{1.73_m2} (ref >59)

## 2022-12-08 LAB — ANEMIA PROFILE B
Basophils Absolute: 0 10*3/uL (ref 0.0–0.2)
Basos: 1 %
EOS (ABSOLUTE): 0 10*3/uL (ref 0.0–0.4)
Eos: 1 %
Ferritin: 11 ng/mL — ABNORMAL LOW (ref 15–150)
Folate: 14.5 ng/mL (ref >3.0)
Hematocrit: 47.3 % — ABNORMAL HIGH (ref 34.0–46.6)
Hemoglobin: 15.3 g/dL (ref 11.1–15.9)
Immature Grans (Abs): 0 10*3/uL (ref 0.0–0.1)
Immature Granulocytes: 0 %
Iron Saturation: 34 % (ref 15–55)
Iron: 122 ug/dL (ref 27–159)
Lymphocytes Absolute: 1.9 10*3/uL (ref 0.7–3.1)
Lymphs: 30 %
MCH: 28.8 pg (ref 26.6–33.0)
MCHC: 32.3 g/dL (ref 31.5–35.7)
MCV: 89 fL (ref 79–97)
Monocytes Absolute: 0.4 10*3/uL (ref 0.1–0.9)
Monocytes: 6 %
Neutrophils Absolute: 4.1 10*3/uL (ref 1.4–7.0)
Neutrophils: 62 %
Platelets: 280 10*3/uL (ref 150–450)
RBC: 5.31 x10E6/uL — ABNORMAL HIGH (ref 3.77–5.28)
RDW: 12.4 % (ref 11.7–15.4)
Retic Ct Pct: 1.4 % (ref 0.6–2.6)
Total Iron Binding Capacity: 359 ug/dL (ref 250–450)
UIBC: 237 ug/dL (ref 131–425)
Vitamin B-12: 300 pg/mL (ref 232–1245)
WBC: 6.5 10*3/uL (ref 3.4–10.8)

## 2022-12-08 LAB — VITAMIN D 25 HYDROXY (VIT D DEFICIENCY, FRACTURES): Vit D, 25-Hydroxy: 22.4 ng/mL — ABNORMAL LOW (ref 30.0–100.0)

## 2022-12-09 ENCOUNTER — Telehealth (INDEPENDENT_AMBULATORY_CARE_PROVIDER_SITE_OTHER): Payer: Medicare Other | Admitting: Family Medicine

## 2022-12-09 ENCOUNTER — Encounter: Payer: Self-pay | Admitting: Family Medicine

## 2022-12-09 DIAGNOSIS — J358 Other chronic diseases of tonsils and adenoids: Secondary | ICD-10-CM

## 2022-12-09 DIAGNOSIS — J029 Acute pharyngitis, unspecified: Secondary | ICD-10-CM

## 2022-12-09 MED ORDER — AMOXICILLIN-POT CLAVULANATE 875-125 MG PO TABS: 1.0000 | ORAL_TABLET | Freq: Two times a day (BID) | ORAL | 0 refills | Status: AC

## 2022-12-09 NOTE — Progress Notes (Signed)
Virtual Visit via Video   I connected with patient on 12/09/22 at 1615 by a video enabled telemedicine application and verified that I am speaking with the correct person using two identifiers.  Location patient: Home Location provider: [place] participating in the virtual visit: Patient and Provider  I discussed the limitations of evaluation and management by telemedicine and the availability of in person appointments. The patient expressed understanding and agreed to proceed.  Subjective:   HPI:  Pt presents today for  Chief Complaint  Patient presents with   Sore Throat   Sore Throat: Patient complains of sore throat. Associated symptoms include enlarged tonsils, hoarseness, sore throat, and white spots in throat.Onset of symptoms was 3 days ago, gradually worsening since that time. She is drinking plenty of fluids. She does not have had recent close exposure to someone with proven streptococcal pharyngitis.   Review of Systems  Constitutional:  Positive for chills and malaise/fatigue. Negative for diaphoresis, fever and weight loss.  HENT:  Positive for sore throat. Negative for congestion, ear discharge, ear pain, hearing loss, nosebleeds, sinus pain and tinnitus.   Respiratory:  Negative for cough, shortness of breath and stridor.   Cardiovascular:  Negative for chest pain.  Skin:  Negative for itching and rash.  Neurological:  Negative for headaches.     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   Pelvic pain 12/13/2018   History of seizures 12/13/2018   Encounter for gynecological examination with Papanicolaou smear of cervix 10/19/2018   Menorrhagia with irregular cycle 10/19/2018   Cysts of both ovaries 10/19/2018   Dysmenorrhea 10/19/2018   Encounter for menstrual regulation 10/19/2018   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   Seizure-like activity ([place]) 04/14/2018   Migraine without status migrainosus, not intractable 04/14/2018   Chronic low back pain 04/12/2018   History of DVT of lower extremity 04/12/2018   Legal blindness 04/12/2018   POTS (postural orthostatic tachycardia syndrome) 04/12/2018   Conversion disorder 04/12/2018   IBS (irritable bowel syndrome) 04/12/2018   Dextroscoliosis 04/12/2018   Diarrhea 05/26/2017   Occipital neuralgia 04/04/2017   Psychogenic nonepileptic seizure 04/04/2017   Vitamin D deficiency 02/03/2017   Audible heartbeat in right ear 11/30/2016   Other abnormalities of gait and mobility 11/30/2016   DVT (deep venous thrombosis) ([place]) 08/05/2016   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   Frequent headaches 02/06/2016   Recurrent subluxation of patella, unspecified knee 01/22/2015   Palpitations 10/11/2014   Body aches 06/21/2014   Retention of urine, unspecified 06/20/2014   Disordered eating 05/10/2014   Malnutrition ([place]) 05/10/2014   Tinea corporis 05/10/2014   Fatigue 03/26/2014   Anxiety 03/26/2014   Loose stools 03/26/2014   Poor appetite 03/26/2014   Weight loss 03/26/2014   Chronic fatigue syndrome 03/26/2014   Chronic headaches 03/26/2014   Insomnia 12/18/2013   Cervical adenopathy 12/11/2013   Increased frequency of urination 08/29/2013   Dyspepsia 07/16/2013   Carnitine deficiency ([place]) 07/31/2012    Social History   Tobacco Use  Smoking status: Never   Smokeless tobacco: Never  Substance Use Topics   Alcohol use: Never    Current Outpatient Medications:    acetaminophen  (TYLENOL) 325 MG tablet, Take by mouth., Disp: , Rfl:    amoxicillin-clavulanate (AUGMENTIN) 875-125 MG tablet, Take 1 tablet by mouth 2 (two) times daily for 10 days., Disp: 20 tablet, Rfl: 0   Brexpiprazole 0.5 MG TABS, Take by mouth., Disp: , Rfl:    Cholecalciferol 25 MCG (1000 UT) capsule, Take by mouth., Disp: , Rfl:    Docusate Sodium (DSS) 100 MG CAPS, Take by mouth., Disp: , Rfl:    Ferrous Sulfate Dried (FERROUS SULFATE IRON PO), Take by mouth., Disp: , Rfl:    Galcanezumab-gnlm (EMGALITY) 120 MG/ML SOAJ, INJECT 1 ML INTO THE SKIN EVERY 30 DAYS., Disp: 1.12 mL, Rfl: 3   lamoTRIgine (LAMICTAL) 100 MG tablet, Take 1 tablet (100 mg total) by mouth 2 (two) times daily., Disp: 180 tablet, Rfl: 0   LORazepam (ATIVAN) 0.5 MG tablet, Take 0.5 mg by mouth 2 (two) times daily as needed., Disp: , Rfl:    megestrol (MEGACE) 40 MG tablet, Take 1 tablet (40 mg total) by mouth daily., Disp: 30 tablet, Rfl: 11   ondansetron (ZOFRAN ODT) 4 MG disintegrating tablet, Take 1 tablet (4 mg total) by mouth every 8 (eight) hours as needed for nausea or vomiting., Disp: 20 tablet, Rfl: 0   venlafaxine XR (EFFEXOR-XR) 75 MG 24 hr capsule, Take 1 capsule (75 mg total) by mouth daily., Disp: 90 capsule, Rfl: 1  Allergies  Allergen Reactions   Sulfa Antibiotics Anaphylaxis and Shortness Of Breath   Amitriptyline Other (See Comments)    Hallucinations   Cephalexin Nausea And Vomiting   Maxalt [Rizatriptan] Hives   Minocycline Nausea And Vomiting    Objective:   LMP 11/28/2022   Patient is well-developed, well-nourished in no acute distress.  Resting comfortably at home.  Head is normocephalic, atraumatic.  No labored breathing.  Speech is clear and coherent with logical content.  Patient is alert and oriented at baseline.  Posterior oropharynx with white exudates on right tonsil.  Assessment and Plan:   Samantha Dougherty was seen today for sore throat.  Diagnoses and all orders for this visit:  Sore  throat Tonsillar exudate Suspected bacterial pharyngitis as pt does have noted white exudates and erythema to tonsils. Will treat with below. Aware to report new, worsening, or persistent symptoms.  -     amoxicillin-clavulanate (AUGMENTIN) 875-125 MG tablet; Take 1 tablet by mouth 2 (two) times daily for 10 days.      Return if symptoms worsen or fail to improve.  Monia Pouch, FNP-C [facility] [address] [place], [place] [address] [phone]  12/09/2022  Time spent with the patient: 15 minutes, of which >50% was spent in obtaining information about symptoms, reviewing previous labs, evaluations, and treatments, counseling about condition (please see the discussed topics above), and developing a plan to further investigate it; had a number of questions which I addressed.

## 2022-12-13 LAB — SPECIMEN STATUS REPORT

## 2022-12-13 LAB — TSH

## 2022-12-14 ENCOUNTER — Encounter: Payer: Self-pay | Admitting: Family Medicine

## 2022-12-14 ENCOUNTER — Other Ambulatory Visit: Payer: Self-pay

## 2022-12-14 DIAGNOSIS — R5381 Other malaise: Secondary | ICD-10-CM

## 2023-01-10 ENCOUNTER — Telehealth: Payer: Self-pay | Admitting: Family Medicine

## 2023-01-10 NOTE — Telephone Encounter (Signed)
Contacted [name] to schedule their annual wellness visit. Appointment made for 01/18/2023.   Thank you,  Judeth Cornfield,  AMB Clinical Support [facility] AWV Program Direct Dial ??[phone]

## 2023-01-18 ENCOUNTER — Ambulatory Visit: Payer: Medicare Other

## 2023-02-14 ENCOUNTER — Encounter: Payer: Self-pay | Admitting: Family Medicine
